# Patient Record
Sex: Male | Born: 1935 | Race: Black or African American | Hispanic: No | Marital: Married | Smoking: Former smoker
Health system: Southern US, Community
[De-identification: ages and names within clinical notes are randomized; demographics above are authoritative.]

## PROBLEM LIST (undated history)

## (undated) DIAGNOSIS — Z9989 Dependence on other enabling machines and devices: Secondary | ICD-10-CM

## (undated) DIAGNOSIS — G4730 Sleep apnea, unspecified: Secondary | ICD-10-CM

## (undated) DIAGNOSIS — R351 Nocturia: Secondary | ICD-10-CM

## (undated) DIAGNOSIS — E7800 Pure hypercholesterolemia, unspecified: Secondary | ICD-10-CM

## (undated) DIAGNOSIS — I517 Cardiomegaly: Secondary | ICD-10-CM

## (undated) DIAGNOSIS — C679 Malignant neoplasm of bladder, unspecified: Secondary | ICD-10-CM

## (undated) DIAGNOSIS — Z789 Other specified health status: Secondary | ICD-10-CM

## (undated) DIAGNOSIS — I10 Essential (primary) hypertension: Secondary | ICD-10-CM

## (undated) DIAGNOSIS — K219 Gastro-esophageal reflux disease without esophagitis: Secondary | ICD-10-CM

## (undated) DIAGNOSIS — H9190 Unspecified hearing loss, unspecified ear: Secondary | ICD-10-CM

## (undated) DIAGNOSIS — K759 Inflammatory liver disease, unspecified: Secondary | ICD-10-CM

## (undated) DIAGNOSIS — D126 Benign neoplasm of colon, unspecified: Secondary | ICD-10-CM

## (undated) DIAGNOSIS — K8020 Calculus of gallbladder without cholecystitis without obstruction: Secondary | ICD-10-CM

## (undated) DIAGNOSIS — Z87442 Personal history of urinary calculi: Secondary | ICD-10-CM

## (undated) DIAGNOSIS — G4733 Obstructive sleep apnea (adult) (pediatric): Secondary | ICD-10-CM

## (undated) DIAGNOSIS — N210 Calculus in bladder: Secondary | ICD-10-CM

## (undated) DIAGNOSIS — L309 Dermatitis, unspecified: Secondary | ICD-10-CM

## (undated) DIAGNOSIS — N320 Bladder-neck obstruction: Secondary | ICD-10-CM

## (undated) DIAGNOSIS — C61 Malignant neoplasm of prostate: Secondary | ICD-10-CM

## (undated) HISTORY — DX: Essential (primary) hypertension: I10

## (undated) HISTORY — PX: KNEE ARTHROSCOPY: SUR90

## (undated) HISTORY — DX: Dermatitis, unspecified: L30.9

## (undated) HISTORY — DX: Unspecified hearing loss, unspecified ear: H91.90

## (undated) HISTORY — DX: Malignant neoplasm of prostate: C61

## (undated) HISTORY — PX: CARDIOVASCULAR STRESS TEST: SHX262

## (undated) HISTORY — DX: Benign neoplasm of colon, unspecified: D12.6

## (undated) HISTORY — DX: Calculus of gallbladder without cholecystitis without obstruction: K80.20

---

## 2002-06-22 ENCOUNTER — Encounter: Admission: RE | Admit: 2002-06-22 | Discharge: 2002-06-22 | Payer: Self-pay | Admitting: Internal Medicine

## 2002-07-28 ENCOUNTER — Encounter: Admission: RE | Admit: 2002-07-28 | Discharge: 2002-07-28 | Payer: Self-pay | Admitting: Internal Medicine

## 2002-11-22 ENCOUNTER — Encounter: Admission: RE | Admit: 2002-11-22 | Discharge: 2002-11-22 | Payer: Self-pay | Admitting: Infectious Diseases

## 2003-01-22 ENCOUNTER — Encounter: Admission: RE | Admit: 2003-01-22 | Discharge: 2003-01-22 | Payer: Self-pay | Admitting: Internal Medicine

## 2003-01-24 ENCOUNTER — Encounter: Admission: RE | Admit: 2003-01-24 | Discharge: 2003-01-24 | Payer: Self-pay | Admitting: Internal Medicine

## 2003-08-23 ENCOUNTER — Encounter: Admission: RE | Admit: 2003-08-23 | Discharge: 2003-08-23 | Payer: Self-pay | Admitting: Internal Medicine

## 2003-09-03 ENCOUNTER — Encounter: Admission: RE | Admit: 2003-09-03 | Discharge: 2003-09-03 | Payer: Self-pay | Admitting: Internal Medicine

## 2003-09-10 ENCOUNTER — Encounter: Admission: RE | Admit: 2003-09-10 | Discharge: 2003-09-10 | Payer: Self-pay | Admitting: Internal Medicine

## 2003-10-26 ENCOUNTER — Emergency Department (HOSPITAL_COMMUNITY): Admission: EM | Admit: 2003-10-26 | Discharge: 2003-10-26 | Payer: Self-pay | Admitting: Emergency Medicine

## 2003-11-29 ENCOUNTER — Encounter: Admission: RE | Admit: 2003-11-29 | Discharge: 2003-11-29 | Payer: Self-pay | Admitting: Internal Medicine

## 2004-01-17 ENCOUNTER — Encounter: Admission: RE | Admit: 2004-01-17 | Discharge: 2004-01-17 | Payer: Self-pay | Admitting: Internal Medicine

## 2004-05-22 ENCOUNTER — Ambulatory Visit: Payer: Self-pay | Admitting: Internal Medicine

## 2004-06-26 ENCOUNTER — Ambulatory Visit: Payer: Self-pay | Admitting: Internal Medicine

## 2005-12-14 ENCOUNTER — Encounter: Payer: Self-pay | Admitting: Internal Medicine

## 2005-12-14 DIAGNOSIS — D126 Benign neoplasm of colon, unspecified: Secondary | ICD-10-CM

## 2005-12-14 HISTORY — DX: Benign neoplasm of colon, unspecified: D12.6

## 2005-12-14 LAB — HM COLONOSCOPY

## 2006-06-03 ENCOUNTER — Ambulatory Visit: Payer: Self-pay | Admitting: Internal Medicine

## 2006-06-03 LAB — CONVERTED CEMR LAB
AST: 23 units/L (ref 0–37)
Albumin: 4.4 g/dL (ref 3.5–5.2)
Alkaline Phosphatase: 43 units/L (ref 39–117)
Basophils percent auto: 0 % (ref 0–1)
Eosinophils Absolute: 0.2 cells/mcL (ref 0.0–0.7)
Eosinophils Relative: 2 % (ref 0–5)
HCT: 46.1 % (ref 39.0–52.0)
Leukocyte count, blood: 7.3 10*9/L (ref 4.0–10.5)
MCHC: 34.1 g/dL (ref 30.0–36.0)
MCV: 90 fL (ref 78.0–100.0)
Neutrophils Relative %: 36 % — ABNORMAL LOW (ref 43–77)
Platelets: 195 10*3/uL (ref 150–400)
Potassium: 3.9 meq/L (ref 3.5–5.3)
RDW: 13.3 % (ref 11.5–14.0)
Sodium: 138 meq/L (ref 135–145)
Total Bilirubin: 0.5 mg/dL (ref 0.3–1.2)
Total Protein: 7.4 g/dL (ref 6.0–8.3)

## 2006-07-09 DIAGNOSIS — G4700 Insomnia, unspecified: Secondary | ICD-10-CM | POA: Insufficient documentation

## 2006-07-09 DIAGNOSIS — B1710 Acute hepatitis C without hepatic coma: Secondary | ICD-10-CM | POA: Insufficient documentation

## 2006-07-09 DIAGNOSIS — F528 Other sexual dysfunction not due to a substance or known physiological condition: Secondary | ICD-10-CM

## 2006-07-09 DIAGNOSIS — M1712 Unilateral primary osteoarthritis, left knee: Secondary | ICD-10-CM | POA: Insufficient documentation

## 2006-07-09 DIAGNOSIS — Z87898 Personal history of other specified conditions: Secondary | ICD-10-CM

## 2006-07-09 DIAGNOSIS — Z8619 Personal history of other infectious and parasitic diseases: Secondary | ICD-10-CM

## 2006-07-09 DIAGNOSIS — J309 Allergic rhinitis, unspecified: Secondary | ICD-10-CM | POA: Insufficient documentation

## 2006-07-09 DIAGNOSIS — I10 Essential (primary) hypertension: Secondary | ICD-10-CM | POA: Insufficient documentation

## 2007-07-06 ENCOUNTER — Ambulatory Visit (HOSPITAL_COMMUNITY): Admission: RE | Admit: 2007-07-06 | Discharge: 2007-07-06 | Payer: Self-pay | Admitting: Internal Medicine

## 2007-07-06 ENCOUNTER — Ambulatory Visit: Payer: Self-pay | Admitting: Internal Medicine

## 2007-07-06 DIAGNOSIS — G4733 Obstructive sleep apnea (adult) (pediatric): Secondary | ICD-10-CM

## 2007-08-08 ENCOUNTER — Ambulatory Visit: Payer: Self-pay | Admitting: Internal Medicine

## 2007-08-09 LAB — CONVERTED CEMR LAB: OCCULT 1: NEGATIVE

## 2007-08-10 LAB — CONVERTED CEMR LAB: OCCULT 3: NEGATIVE

## 2007-08-11 DIAGNOSIS — C61 Malignant neoplasm of prostate: Secondary | ICD-10-CM

## 2007-08-11 HISTORY — DX: Malignant neoplasm of prostate: C61

## 2007-08-11 LAB — CONVERTED CEMR LAB: OCCULT 2: NEGATIVE

## 2007-08-19 ENCOUNTER — Ambulatory Visit: Payer: Self-pay | Admitting: Internal Medicine

## 2007-11-16 ENCOUNTER — Encounter: Payer: Self-pay | Admitting: Internal Medicine

## 2007-11-17 ENCOUNTER — Ambulatory Visit (HOSPITAL_COMMUNITY): Admission: RE | Admit: 2007-11-17 | Discharge: 2007-11-17 | Payer: Self-pay | Admitting: Urology

## 2007-11-22 ENCOUNTER — Ambulatory Visit: Admission: RE | Admit: 2007-11-22 | Discharge: 2008-02-20 | Payer: Self-pay | Admitting: Radiation Oncology

## 2008-01-31 ENCOUNTER — Ambulatory Visit: Payer: Self-pay | Admitting: Internal Medicine

## 2008-02-21 ENCOUNTER — Ambulatory Visit: Admission: RE | Admit: 2008-02-21 | Discharge: 2008-04-01 | Payer: Self-pay | Admitting: Radiation Oncology

## 2008-03-28 ENCOUNTER — Encounter: Payer: Self-pay | Admitting: Internal Medicine

## 2008-04-03 ENCOUNTER — Encounter: Admission: RE | Admit: 2008-04-03 | Discharge: 2008-04-03 | Payer: Self-pay

## 2008-04-11 ENCOUNTER — Ambulatory Visit (HOSPITAL_BASED_OUTPATIENT_CLINIC_OR_DEPARTMENT_OTHER): Admission: RE | Admit: 2008-04-11 | Discharge: 2008-04-11 | Payer: Self-pay | Admitting: Urology

## 2008-04-11 ENCOUNTER — Encounter: Payer: Self-pay | Admitting: Internal Medicine

## 2008-04-11 HISTORY — PX: OTHER SURGICAL HISTORY: SHX169

## 2008-05-01 ENCOUNTER — Ambulatory Visit: Admission: RE | Admit: 2008-05-01 | Discharge: 2008-06-23 | Payer: Self-pay | Admitting: Radiation Oncology

## 2008-05-02 ENCOUNTER — Encounter: Payer: Self-pay | Admitting: Internal Medicine

## 2008-05-07 ENCOUNTER — Ambulatory Visit: Payer: Self-pay | Admitting: Internal Medicine

## 2008-05-09 ENCOUNTER — Telehealth: Payer: Self-pay | Admitting: Internal Medicine

## 2008-06-07 ENCOUNTER — Encounter: Payer: Self-pay | Admitting: Internal Medicine

## 2008-08-10 DIAGNOSIS — C679 Malignant neoplasm of bladder, unspecified: Secondary | ICD-10-CM

## 2008-08-10 HISTORY — DX: Malignant neoplasm of bladder, unspecified: C67.9

## 2008-12-19 ENCOUNTER — Ambulatory Visit (HOSPITAL_COMMUNITY): Admission: RE | Admit: 2008-12-19 | Discharge: 2008-12-20 | Payer: Self-pay | Admitting: Urology

## 2008-12-19 ENCOUNTER — Encounter (INDEPENDENT_AMBULATORY_CARE_PROVIDER_SITE_OTHER): Payer: Self-pay | Admitting: Urology

## 2008-12-19 DIAGNOSIS — D303 Benign neoplasm of bladder: Secondary | ICD-10-CM

## 2008-12-19 HISTORY — PX: TRANSURETHRAL RESECTION OF BLADDER NECK: SHX6196

## 2008-12-24 ENCOUNTER — Emergency Department (HOSPITAL_COMMUNITY): Admission: EM | Admit: 2008-12-24 | Discharge: 2008-12-24 | Payer: Self-pay | Admitting: Emergency Medicine

## 2009-02-19 ENCOUNTER — Encounter: Payer: Self-pay | Admitting: Internal Medicine

## 2009-02-19 ENCOUNTER — Encounter (INDEPENDENT_AMBULATORY_CARE_PROVIDER_SITE_OTHER): Payer: Self-pay | Admitting: Urology

## 2009-02-19 ENCOUNTER — Ambulatory Visit (HOSPITAL_BASED_OUTPATIENT_CLINIC_OR_DEPARTMENT_OTHER): Admission: RE | Admit: 2009-02-19 | Discharge: 2009-02-19 | Payer: Self-pay | Admitting: Urology

## 2009-02-19 HISTORY — PX: TRANSURETHRAL RESECTION OF BLADDER TUMOR: SHX2575

## 2009-04-03 ENCOUNTER — Ambulatory Visit: Payer: Self-pay | Admitting: Internal Medicine

## 2009-04-03 DIAGNOSIS — C7951 Secondary malignant neoplasm of bone: Secondary | ICD-10-CM

## 2009-04-03 DIAGNOSIS — R300 Dysuria: Secondary | ICD-10-CM | POA: Insufficient documentation

## 2009-04-03 DIAGNOSIS — N3941 Urge incontinence: Secondary | ICD-10-CM

## 2009-04-03 DIAGNOSIS — C61 Malignant neoplasm of prostate: Secondary | ICD-10-CM | POA: Insufficient documentation

## 2009-04-03 LAB — CONVERTED CEMR LAB
ALT: 38 units/L (ref 0–53)
Albumin: 4.3 g/dL (ref 3.5–5.2)
Bacteria, UA: NONE SEEN
Basophils Relative: 0 % (ref 0–1)
Bilirubin Urine: NEGATIVE
CO2: 22 meq/L (ref 19–32)
Chloride: 107 meq/L (ref 96–112)
Cholesterol: 152 mg/dL (ref 0–200)
Glucose, Bld: 100 mg/dL — ABNORMAL HIGH (ref 70–99)
Hemoglobin: 14.4 g/dL (ref 13.0–17.0)
LDL Cholesterol: 86 mg/dL (ref 0–99)
Lymphocytes Relative: 47 % — ABNORMAL HIGH (ref 12–46)
Lymphs Abs: 2.8 10*3/uL (ref 0.7–4.0)
Monocytes Absolute: 0.7 10*3/uL (ref 0.1–1.0)
Monocytes Relative: 12 % (ref 3–12)
Neutro Abs: 2.3 10*3/uL (ref 1.7–7.7)
Neutrophils Relative %: 39 % — ABNORMAL LOW (ref 43–77)
Nitrite: NEGATIVE
Potassium: 4.4 meq/L (ref 3.5–5.3)
RBC: 4.76 M/uL (ref 4.22–5.81)
Sodium: 141 meq/L (ref 135–145)
Specific Gravity, Urine: 1.026 (ref 1.005–1.0)
Total Bilirubin: 0.3 mg/dL (ref 0.3–1.2)
Total Protein: 7.8 g/dL (ref 6.0–8.3)
Urobilinogen, UA: 0.2 (ref 0.0–1.0)
VLDL: 37 mg/dL (ref 0–40)
WBC: 5.9 10*3/uL (ref 4.0–10.5)
pH: 5.5 (ref 5.0–8.0)

## 2009-04-09 ENCOUNTER — Encounter: Payer: Self-pay | Admitting: Internal Medicine

## 2009-04-10 ENCOUNTER — Ambulatory Visit: Payer: Self-pay | Admitting: Internal Medicine

## 2009-04-10 DIAGNOSIS — R195 Other fecal abnormalities: Secondary | ICD-10-CM

## 2009-04-24 ENCOUNTER — Encounter: Payer: Self-pay | Admitting: Internal Medicine

## 2009-05-24 ENCOUNTER — Encounter: Payer: Self-pay | Admitting: Internal Medicine

## 2009-06-05 ENCOUNTER — Ambulatory Visit: Payer: Self-pay | Admitting: Internal Medicine

## 2009-06-05 DIAGNOSIS — L309 Dermatitis, unspecified: Secondary | ICD-10-CM | POA: Insufficient documentation

## 2009-06-05 LAB — CONVERTED CEMR LAB
Bacteria, UA: NONE SEEN
Bilirubin Urine: NEGATIVE
Specific Gravity, Urine: 1.029 (ref 1.005–1.0)
pH: 5.5 (ref 5.0–8.0)

## 2010-07-16 ENCOUNTER — Telehealth: Payer: Self-pay | Admitting: *Deleted

## 2010-08-20 ENCOUNTER — Ambulatory Visit: Admit: 2010-08-20 | Payer: Self-pay

## 2010-09-01 ENCOUNTER — Telehealth: Payer: Self-pay | Admitting: Internal Medicine

## 2010-09-07 LAB — CONVERTED CEMR LAB
ALT: 39 units/L (ref 0–53)
AST: 23 units/L (ref 0–37)
Alkaline Phosphatase: 45 units/L (ref 39–117)
BUN: 13 mg/dL (ref 6–23)
Basophils Absolute: 0 10*3/uL (ref 0.0–0.1)
Basophils Relative: 0 % (ref 0–1)
Chloride: 109 meq/L (ref 96–112)
Creatinine, Ser: 1.23 mg/dL (ref 0.40–1.50)
Eosinophils Absolute: 0.3 10*3/uL (ref 0.2–0.7)
Hemoglobin: 15.8 g/dL (ref 13.0–17.0)
MCHC: 34.2 g/dL (ref 30.0–36.0)
MCV: 90.9 fL (ref 78.0–100.0)
Monocytes Absolute: 0.8 10*3/uL (ref 0.1–1.0)
Monocytes Relative: 11 % (ref 3–12)
Neutro Abs: 2.4 10*3/uL (ref 1.7–7.7)
Neutrophils Relative %: 34 % — ABNORMAL LOW (ref 43–77)
RBC: 5.08 M/uL (ref 4.22–5.81)
RDW: 13.4 % (ref 11.5–15.5)

## 2010-09-11 NOTE — Progress Notes (Signed)
Summary: Refill/gh  Phone Note Refill Request Message from:  Fax from Pharmacy on September 01, 2010 1:53 PM  Refills Requested: Medication #1:  TRIAMCINOLONE ACETONIDE 0.1 % CREA Apply a thin layer to affected area on hands two times a day for 10 days..   Last Refilled: 06/05/2009  Method Requested: Electronic Initial call taken by: Angelina Ok RN,  September 01, 2010 1:53 PM  Follow-up for Phone Call        Refilled electronically.   Follow-up by: Margarito Liner MD,  September 02, 2010 4:33 PM    Prescriptions: TRIAMCINOLONE ACETONIDE 0.1 % CREA (TRIAMCINOLONE ACETONIDE) Apply a thin layer to affected area on hands two times a day for 10 days.  #80 grams x 0   Entered and Authorized by:   Margarito Liner MD   Signed by:   Margarito Liner MD on 09/02/2010   Method used:   Electronically to        [facility] [address] [phone]* (retail)       [address]       [place], [place]  [id number]       Ph: [phone] or [phone]       Fax: [phone]   RxID:   [id number]

## 2010-09-11 NOTE — Progress Notes (Signed)
Summary: refill/ hla  Phone Note Refill Request Message from:  Fax from Pharmacy on July 16, 2010 2:09 PM  Refills Requested: Medication #1:  NIFEDIPINE CR OSMOTIC 60 MG XR24H-TAB Take 2 tablets by mouth once a day   Dosage confirmed as above?Dosage Confirmed Initial call taken by: Marin Roberts RN,  July 16, 2010 2:09 PM  Follow-up for Phone Call        Refilled electronically.  Please schedule a follow-up  appointment and notify patient; he has not been seen in over 1 year. Follow-up by: Margarito Liner MD,  July 16, 2010 6:22 PM  Additional Follow-up for Phone Call Additional follow up Details #1::        appt scheduled in jan Additional Follow-up by: Marin Roberts RN,  July 23, 2010 3:49 PM    Prescriptions: NIFEDIPINE CR OSMOTIC 60 MG XR24H-TAB (NIFEDIPINE) Take 2 tablets by mouth once a day  #60 Tablet x 1   Entered and Authorized by:   Margarito Liner MD   Signed by:   Margarito Liner MD on 07/16/2010   Method used:   [facility]        [facility] [facility] [address] [phone]* (retail)       [address]       [place], [place]  [id number]       Ph: [phone] or [phone]       Fax: [phone]   RxID:   [id number]

## 2010-09-19 ENCOUNTER — Other Ambulatory Visit: Payer: Self-pay | Admitting: Internal Medicine

## 2010-09-19 NOTE — Telephone Encounter (Signed)
Patient has not been seen here since October of 2010 and missed his last 2 appointments.  I will refill the med, but he will need to be seen before further refills are approved.  Please notify him and make an appointment for him.

## 2010-09-22 NOTE — Telephone Encounter (Signed)
Called and talked to pt's wife.  She has tried to make appointments but unable to reach anyone. I will have scheduling call tomorrow and make appointment.

## 2010-10-22 ENCOUNTER — Other Ambulatory Visit: Payer: Self-pay | Admitting: Internal Medicine

## 2010-11-16 LAB — CBC
HCT: 34 % — ABNORMAL LOW (ref 39.0–52.0)
Hemoglobin: 11.4 g/dL — ABNORMAL LOW (ref 13.0–17.0)
Platelets: 199 10*3/uL (ref 150–400)
RBC: 3.65 MIL/uL — ABNORMAL LOW (ref 4.22–5.81)
WBC: 5.7 10*3/uL (ref 4.0–10.5)

## 2010-11-16 LAB — BASIC METABOLIC PANEL
GFR calc Af Amer: >60 mL/min (ref >60)
GFR calc non Af Amer: >60 mL/min (ref >60)
Potassium: 4.3 mEq/L (ref 3.5–5.1)
Sodium: 141 mEq/L (ref 135–145)

## 2010-11-18 LAB — URINE CULTURE: Culture: NO GROWTH

## 2010-11-18 LAB — CBC
HCT: 44 % (ref 39.0–52.0)
MCHC: 34.8 g/dL (ref 30.0–36.0)
MCV: 90.1 fL (ref 78.0–100.0)
Platelets: 179 10*3/uL (ref 150–400)
RDW: 13.5 % (ref 11.5–15.5)

## 2010-11-18 LAB — URINALYSIS, ROUTINE W REFLEX MICROSCOPIC
Bilirubin Urine: NEGATIVE
Ketones, ur: NEGATIVE mg/dL
Nitrite: POSITIVE — AB
Nitrite: NEGATIVE
Protein, ur: 30 mg/dL — AB
Specific Gravity, Urine: 1.023 (ref 1.005–1.030)
Urobilinogen, UA: 0.2 mg/dL (ref 0.0–1.0)
pH: 5.5 (ref 5.0–8.0)

## 2010-11-18 LAB — BASIC METABOLIC PANEL
BUN: 10 mg/dL (ref 6–23)
GFR calc non Af Amer: >60 mL/min (ref >60)
Glucose, Bld: 93 mg/dL (ref 70–99)
Potassium: 4.2 mEq/L (ref 3.5–5.1)

## 2010-11-18 LAB — URINE MICROSCOPIC-ADD ON

## 2010-11-26 ENCOUNTER — Ambulatory Visit (INDEPENDENT_AMBULATORY_CARE_PROVIDER_SITE_OTHER): Payer: MEDICARE | Admitting: Internal Medicine

## 2010-11-26 ENCOUNTER — Encounter: Payer: Self-pay | Admitting: Internal Medicine

## 2010-11-26 VITALS — BP 120/80 | HR 70 | Temp 97.3°F | Ht 70.5 in | Wt 227.7 lb

## 2010-11-26 DIAGNOSIS — I10 Essential (primary) hypertension: Secondary | ICD-10-CM

## 2010-11-26 DIAGNOSIS — C61 Malignant neoplasm of prostate: Secondary | ICD-10-CM

## 2010-11-26 DIAGNOSIS — D126 Benign neoplasm of colon, unspecified: Secondary | ICD-10-CM

## 2010-11-26 DIAGNOSIS — G4733 Obstructive sleep apnea (adult) (pediatric): Secondary | ICD-10-CM

## 2010-11-26 DIAGNOSIS — R42 Dizziness and giddiness: Secondary | ICD-10-CM

## 2010-11-26 DIAGNOSIS — L259 Unspecified contact dermatitis, unspecified cause: Secondary | ICD-10-CM

## 2010-11-26 LAB — CBC WITH DIFFERENTIAL/PLATELET
Basophils Absolute: 0 10*3/uL (ref 0.0–0.1)
Basophils Relative: 0 % (ref 0–1)
Eosinophils Relative: 2 % (ref 0–5)
Lymphocytes Relative: 52 % — ABNORMAL HIGH (ref 12–46)
MCHC: 34.6 g/dL (ref 30.0–36.0)
MCV: 89.9 fL (ref 78.0–100.0)
Neutro Abs: 2.1 10*3/uL (ref 1.7–7.7)
Platelets: 184 10*3/uL (ref 150–400)
RDW: 13.6 % (ref 11.5–15.5)
WBC: 5.5 10*3/uL (ref 4.0–10.5)

## 2010-11-26 NOTE — Assessment & Plan Note (Signed)
Patient reports occasional self-limited episodes of postural dizziness that are brief, occur whenever he stands up rapidly after having bent over, and resolve without problems.  He denies any associated symptoms including near-syncope, syncope, chest pain, palpitations, or other problems.  He is not orthostatic today.  The plan is to check basic labs including a CBC, TSH, and metabolic panel.  I advised him to stand up slowly and make sure that he is able to steady himself in case dizziness does occur, and to let me know if he has any worsening of his symptoms.  For now, if his labs are unremarkable, then I will not pursue further workup unless his symptoms worsen.

## 2010-11-26 NOTE — Assessment & Plan Note (Signed)
Patient's hand eczema resolved previously following treatment with topical steroids, and has now recurred.  The plan is to continue his current regimen of topical steroid ointment at night and cream in the mornings.  I advised him also to schedule a follow up appointment with his dermatologist.

## 2010-11-26 NOTE — Assessment & Plan Note (Signed)
Patient reports reasonable compliance with the CPAP; I advised him to use it as much as possible on a nightly basis whenever he is sleeping.

## 2010-11-26 NOTE — Progress Notes (Signed)
  Subjective:    Patient ID: [name], male    DOB: Feb 13, 1936, 75 y.o.   MRN: [id number]  HPI Patient returns for follow up of his hypertension, obstructive sleep apnea, hand eczema, and other chronic medical problems.  He also reports occasional episodes of dizziness that occur when he stands up after bending over; these are self-limited and last for a few seconds before resolving.  He has had no associated symptoms of syncope, near syncope, chest pain, or palpitations.  He reports occasional transient sharp "needle-like" pains around his abdomen; these last for a few seconds and go away; they are superficial in quality.  Otherwise he reports that he is doing well.  He did not follow up for a recommended colonoscopy after his last visit here in October of 2010.  He reports that he has been following up with his urologist for his prostate cancer.  He reports that he is compliant with his medications.  He also reports that after his last visit here, he saw a dermatologist for his hand eczema and was put on Temovate ointment at night and triamcinolone 0.1% cream in the mornings.  He reports that the hand eczema resolved on that regimen, and then has recurred over the past few months so he resumed the Temovate ointment and triamcinolone cream.  He has not schedule a follow up visit with his dermatologist, and could not remember the name of his dermatologist.  He reports that he uses his CPAP for at least 4 hours per night.   Review of Systems  Constitutional: Negative for fever, chills, diaphoresis, activity change and unexpected weight change.  Respiratory: Negative for shortness of breath and wheezing.   Cardiovascular: Positive for leg swelling (Mild ankle swelling bilaterally.). Negative for chest pain.  Gastrointestinal: Negative for nausea, vomiting, abdominal pain, blood in stool and anal bleeding.  Genitourinary: Positive for urgency (Occasional.). Negative for dysuria, frequency, hematuria  and difficulty urinating.  Musculoskeletal: Negative for myalgias and arthralgias.  Neurological: Positive for dizziness. Negative for syncope, weakness and numbness.       Objective:   Physical Exam  Constitutional: No distress.  Cardiovascular: Normal rate, regular rhythm and normal heart sounds.  Exam reveals no gallop and no friction rub.   No murmur heard. Pulmonary/Chest: Effort normal and breath sounds normal. No respiratory distress. He has no wheezes. He has no rales.  Abdominal: Soft. Bowel sounds are normal. He exhibits no distension. There is no tenderness. There is no rebound and no guarding.  Musculoskeletal: He exhibits no edema.  Skin: Rash (Areas of thickened skin on his palms bilaterally with scaling, consistent with eczema) noted.          Assessment & Plan:

## 2010-11-26 NOTE — Assessment & Plan Note (Signed)
Although I advised patient to follow up with his gastroenterologist for repeat colonoscopy at the time of his last visit in October of 2010, he did not follow up as recommended.  I again advised him today of the importance of seeing his gastroenterologist to have a follow up colonoscopy, given his history of an adenomatous colonic polyp.  The plan is to refer him back to his gastroenterologist for evaluation.

## 2010-11-26 NOTE — Assessment & Plan Note (Signed)
Lab Results  Component Value Date   NA 141 04/03/2009   K 4.4 04/03/2009   CL 107 04/03/2009   CO2 22 04/03/2009   BUN 12 04/03/2009   CREATININE 1.09 04/03/2009    BP Readings from Last 3 Encounters:  11/26/10 120/80  06/05/09 148/81  04/03/09 151/82    Assessment: Hypertension control:  controlled  Progress toward goals:  at goal Barriers to meeting goals:  no barriers identified  Plan: Hypertension treatment:  continue current medications

## 2010-11-26 NOTE — Assessment & Plan Note (Signed)
Patient has no symptoms of recurrence.  The plan is to check a PSA today, and I advised him to follow up regularly with his urologist.

## 2010-11-27 LAB — COMPLETE METABOLIC PANEL WITH GFR
Albumin: 4.4 g/dL (ref 3.5–5.2)
BUN: 15 mg/dL (ref 6–23)
Calcium: 9.5 mg/dL (ref 8.4–10.5)
Chloride: 107 mEq/L (ref 96–112)
GFR, Est Non African American: >60 mL/min (ref >60)
Glucose, Bld: 91 mg/dL (ref 70–99)
Potassium: 4.1 mEq/L (ref 3.5–5.3)
Total Protein: 7.4 g/dL (ref 6.0–8.3)

## 2010-12-01 ENCOUNTER — Other Ambulatory Visit: Payer: Self-pay | Admitting: Internal Medicine

## 2010-12-15 ENCOUNTER — Encounter: Payer: Self-pay | Admitting: Internal Medicine

## 2010-12-23 NOTE — Op Note (Signed)
[name], [name]              ACCOUNT NO.:  [id number]   MEDICAL RECORD NO.:  [id number]          PATIENT TYPE:  OIB   LOCATION:  1433                         FACILITY:  [facility]   PHYSICIAN:  Courtney Paris, M.D.DATE OF BIRTH:  [birth year]   DATE OF PROCEDURE:  12/19/2008  DATE OF DISCHARGE:                               OPERATIVE REPORT   PREOPERATIVE DIAGNOSES:  1. Inflammatory lesion, possibly bladder tumor, right bladder neck.  2. Carcinoma of the prostate.   POSTOPERATIVE DIAGNOSES:  1. Inflammatory lesion, possibly bladder tumor, right bladder neck.  2. Carcinoma of the prostate.   OPERATION:  Transurethral resection lesion, bladder neck (1.5 cm.)   ANESTHESIA:  General.   SURGEON:  Courtney Paris, M.D.   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  He got a Lupron shot April 2009 and then had external  radiation therapy between July and August 2009.  He then had prostate  brachytherapy with seeds September 2009.  His first PSA October 2009  after seeds was 0.5.  He has had worsening irritative symptoms, had some  blood in his urine and on cystoscopy in the office had a sessile bladder  neck lesion on the right side that looked like a possible bladder tumor.  He is admitted to have this resected at this time.   The patient was placed on the operating table in a dorsal lithotomy  position after satisfactory induction of general anesthesia.  Was  prepped and draped with Betadine in the usual sterile fashion.  Cipro  was running.  Time-out was then performed and the patient and the  procedure then reconfirmed.  The patient was first cystoscoped with a 21  panendoscope.  No anterior strictures seen.  He had a somewhat short,  fibrotic-appearing prostate and the bladder was entered.  The lesion on  the right bladder neck was noted and photographed.  It was  quite  hyperemic and erythematous and was about 1-1/2 cm in greatest diameter.  When this was done, the urethra was dilated with Sissy Hoff sounds to 30-  [place] and the 28 continuous-flow resectoscope was inserted.  With  continuous flow, the lesion at the bladder neck on the right side was  resected with good hemostasis.  The chips were sent for pathological  examination.  Pictures were made of the postop view.  No other lesions  were seen within the bladder.  The prostate  was quite fibrotic and fixed.  Scope was removed and a 22 three-way  Foley catheter was inserted and continuous bladder irrigation was  started with glycine, to be followed by saline.  The patient was then  taken to the recovery room where he will be admitted for postoperative  extended recovery.      Courtney Paris, M.D.  Electronically Signed     HMK/MEDQ  D:  12/19/2008  T:  12/19/2008  Job:  [id number]

## 2010-12-23 NOTE — Discharge Summary (Signed)
[name], [name]              ACCOUNT NO.:  [id number]   MEDICAL RECORD NO.:  [id number]          PATIENT TYPE:  OIB   LOCATION:  1433                         FACILITY:  [facility]   PHYSICIAN:  Courtney Paris, M.D.DATE OF BIRTH:  Nov 25, 1935   DATE OF ADMISSION:  12/19/2008  DATE OF DISCHARGE:  12/20/2008                               DISCHARGE SUMMARY   DISCHARGE DIAGNOSES:  1. Tumor at the right anterior bladder neck, pending path report.  2. Previous carcinoma of the prostate, post radiation.  3. Microscopic hematuria.   OPERATIONS AND PROCEDURES:  TUR lesion right anterior bladder neck on  Dec 19, 2008.   BRIEF HISTORY:  This 75 year old black male has high risk bladder cancer  T1c with Gleason 4+3, diagnosed March of 2009.  He had Lupron shot in  April 2009 then had radiation externally between July and August of 2009  and then prostate brachytherapy September 2009.  His first PSA after  seeds in October was 0.5.  He had worsening irritative symptoms, cysto  in April of this year showed a sessile lesion at the right anterior  bladder neck and he enters to have this resected at this time.   After satisfactory preoperative evaluation, the patient was taken to the  operating room where the lesion at the right anterior bladder neck was  resected.  He was kept overnight for observation with some continuous  bladder irrigation which was clear.  When the irrigation stopped his  urine remained clear and he was discharged home.  He will continue his  Cipro and Vicodin as needed.  He also was on some Toviaz for some  irritative symptoms and will continue that as well.  He has an  appointment to see me next week in the office.  Sent home in improved  ambulatory condition on a regular diet.      Courtney Paris, M.D.  Electronically Signed     HMK/MEDQ  D:  12/20/2008  T:  12/20/2008  Job:  [id number]

## 2010-12-23 NOTE — Op Note (Signed)
[name], [name]              ACCOUNT NO.:  [id number]   MEDICAL RECORD NO.:  [id number]          PATIENT TYPE:  AMB   LOCATION:  [facility]                         FACILITY:  [facility]   PHYSICIAN:  Jamison Neighbor, M.D.  DATE OF BIRTH:  05-14-1936   DATE OF PROCEDURE:  04/11/2008  DATE OF DISCHARGE:                               OPERATIVE REPORT   SERVICE:  Urology.   PREOPERATIVE DIAGNOSIS:  Carcinoma of the prostate.   POSTOPERATIVE DIAGNOSIS:  Carcinoma of the prostate.   PROCEDURES:  Radiation seed implantation.  Secondary procedure  cystoscopy.   SURGEON:  Jamison Neighbor, M.D.  Radiation oncologist was Maryln Gottron, M.D.   ANESTHESIA:  General.   COMPLICATIONS:  None.   DRAINS:  A 16 French Foley catheter.   HISTORY:  This 75 year old male has known carcinoma of the prostate.  The patient has undergone radiation therapy and is now to complete his  procedure with seed implant.  His prostate is small in size, down to the  size of approximately 20 grams.  He has been fully prepared for the  procedure and will now undergo seed implantation.  The patient  understands the risks and benefits of the procedure and gave full  informed consent.   PROCEDURE IN DETAIL:  After successful induction of general anesthesia  the patient was placed in the dorsal lithotomy position and prepped with  Betadine and draped in the usual sterile fashion.  The patient had the  ultrasound probe inserted by Dr. Dayton Scrape and appropriate planning was  done.  The patient then underwent seed implantation with the Nucletron  system.  Total dose delivered was 110 centigray.  The patient had 16  needles, 60 seeds utilizing I-125.  The patient's fluoroscopic image  following the procedure showed what appeared to be excellent placement  of the seeds, one seed toward the apex may have entered a venous plexus  and appeared to be off to the side somewhat.  However, this was not an  area of great concern  and it was felt that will have appropriate  dosimetry.   Following the completion of the seed implant cystoscopic examination  showed a normal urethra.  There was no evidence of any seed material  anywhere within the prostate or the bladder.  The bladder itself was  inspected.  No tumors or stones could be seen.  Both ureteral orifices  were normal in configuration and location.   The Foley catheter was reinserted.  The patient will keep this  overnight.  He will be sent home with Septra DS and Lorcet and will  return to see [place] in followup in 2-3 weeks.      Jamison Neighbor, M.D.  Electronically Signed     RJE/MEDQ  D:  04/11/2008  T:  04/11/2008  Job:  [id number]

## 2010-12-23 NOTE — Op Note (Signed)
NAME:  [name], [name]              ACCOUNT NO.:  [id number]   MEDICAL RECORD NO.:  [id number]          PATIENT TYPE:  AMB   LOCATION:  [facility]                         [facility]ACILITY:  [facility]   PHYSICIAN:  Courtney Paris, M.D.DATE OF BIRTH:  [birth year]   DATE OF PROCEDURE:  02/19/2009  DATE OF DISCHARGE:                               OPERATIVE REPORT   PREOPERATIVE DIAGNOSES:  Hematuria, history of prostate cancer, status  post brachytherapy.   POSTOPERATIVE DIAGNOSES:  Hematuria, history of prostate cancer, status  post brachytherapy.   PROCEDURE PERFORMED:  1. Transurethral resection of bladder tumor (small).  2. Transurethral resection of bladder neck.   SURGEON:  Dr. Dennison Nancy. Kimbrough.   RESIDENT:  Martin Majestic. Richards.   ANESTHESIA:  General endotracheal anesthesia.   FINDINGS:  1. Small narrowing of the anterior urethra.  2. Erythematous friable bladder mucosa near the right bladder neck      region.  3. Erythematous friable prostatic tissue.   SPECIMENS:  Bladder and prostate chips to pathology.   DRAINS:  A 22-French Foley catheter to gravity.   INDICATIONS FOR PROCEDURE:  [name] is a 75 year old African American  male with a history of prostate cancer, status post brachytherapy.  He  has had recurrent bouts of gross hematuria.  Office cystoscopy revealed  friable mucosa at the bladder neck region.  He now presents for  resection and fulguration.   DESCRIPTION OF PROCEDURE IN DETAIL:  [name] was brought back to the  operating room and appropriately identified via his wristband.  A proper  timeout was called to confirm the correct patient, procedure and site.  After successful induction of general endotracheal anesthesia, he was  position in the dorsal lithotomy position and IV antibiotics were  administered.  His perineum was prepped and draped in the sterile  fashion.  The surgeon were sterilely gowned and gloves.   A 22-French cystoscopic sheath was used  to insert a 12 degrees lens  transurethrally into the bladder.  The anterior urethra revealed a mild  narrowing in the penile urethra which was traversed with ease.  Prostatic urethra showed evidence of friable prostatic fossa with some  mild oozing noted.  The bladder was entered and pancystoscopy was  performed.  The bladder was normal in shape and consistency.  There was  no evidence of any diverticula, stone or foreign bodies.  Bilateral  ureteral orifices noted in the usual anatomic position and seen to  effluxing clear yellow urine.  There was an area of hyperplastic,  hyperemic, erythematous mucosa approximately 2 x 3 cm located in the  right bladder just proximal to the right bladder neck.   The cystoscope was removed.  The urethra was sequentially dilated to 98-  [place] using male [employer] sounds.  A 26-French resectoscope sheath was  then used to insert the resectoscope transurethrally into the patient's  bladder.  It then systematically resected the erythematous bladder  lesion located at the right wall just proximal to the bladder neck  region.  The edges were fulgurated and excellent hemostasis was  achieved.  We then proceeded to  resect the bladder neck at the 5 and 7  o'clock positions down from the bladder down to the veru.  Excellent  hemostasis was once again achieved.  The prostatic and bladder chips  were then retrieved using a Toomey syringe.  Under low-flow, low  pressure conditions, the bladder and prostate were re-inspected and  there was no evidence of any bleeding identified.  Resectoscope was then  removed with a full bladder.  A 22-French Foley catheter was then  inserted transurethrally in the bladder and the balloon was inflated  with 10 mL of sterile water and connected to a drainage bag.  The urine  was clear at the end of the case.  The patient tolerated the procedure  well.  Please note, Dr. Aldean Ast was present, available and  participated in all  aspects of this patient's operation.   DISPOSITION:  The based tolerated the procedure well.  He was extubated  and transported to the PACU in stable condition.     ______________________________  [name], M.D.  Electronically Signed    KR/MEDQ  D:  02/19/2009  T:  02/19/2009  Job:  [id number]

## 2011-01-13 ENCOUNTER — Encounter: Payer: Self-pay | Admitting: Internal Medicine

## 2011-05-12 ENCOUNTER — Telehealth: Payer: Self-pay | Admitting: *Deleted

## 2011-05-12 NOTE — Telephone Encounter (Signed)
Pt's spouse calls and states pt for appr 2 weeks has c/o L shoulder pain, denies injuries, falls... "Just started hurting". appt is given for Friday at 1015 w/ dr Loistine Chance. Also is noted pt has not f/u w/ dr Meredith Pel as instructed and appt is made w/ dr Meredith Pel 11/14 at 1445. Spouse is instructed that pt may go to ED if pain becomes worse or to call triage back if needed.

## 2011-05-12 NOTE — Telephone Encounter (Signed)
Agree with plan 

## 2011-05-15 ENCOUNTER — Ambulatory Visit (HOSPITAL_COMMUNITY)
Admission: RE | Admit: 2011-05-15 | Discharge: 2011-05-15 | Disposition: A | Discharge status: Home / Self Care | Payer: Medicare Other | Source: Ambulatory Visit | Attending: Cardiovascular Disease | Admitting: Cardiovascular Disease

## 2011-05-15 ENCOUNTER — Encounter: Payer: Self-pay | Admitting: Internal Medicine

## 2011-05-15 ENCOUNTER — Ambulatory Visit (HOSPITAL_COMMUNITY)
Admission: RE | Admit: 2011-05-15 | Discharge: 2011-05-15 | Disposition: A | Discharge status: Home / Self Care | Payer: Medicare Other | Source: Ambulatory Visit | Attending: Internal Medicine | Admitting: Internal Medicine

## 2011-05-15 ENCOUNTER — Ambulatory Visit (INDEPENDENT_AMBULATORY_CARE_PROVIDER_SITE_OTHER): Payer: Medicare Other | Admitting: Internal Medicine

## 2011-05-15 VITALS — BP 131/79 | HR 57 | Temp 97.8°F | Ht 70.5 in | Wt 230.3 lb

## 2011-05-15 DIAGNOSIS — M549 Dorsalgia, unspecified: Secondary | ICD-10-CM | POA: Insufficient documentation

## 2011-05-15 DIAGNOSIS — Z23 Encounter for immunization: Secondary | ICD-10-CM

## 2011-05-15 DIAGNOSIS — N390 Urinary tract infection, site not specified: Secondary | ICD-10-CM

## 2011-05-15 DIAGNOSIS — Z8546 Personal history of malignant neoplasm of prostate: Secondary | ICD-10-CM | POA: Insufficient documentation

## 2011-05-15 DIAGNOSIS — C61 Malignant neoplasm of prostate: Secondary | ICD-10-CM

## 2011-05-15 DIAGNOSIS — Z79899 Other long term (current) drug therapy: Secondary | ICD-10-CM

## 2011-05-15 DIAGNOSIS — I498 Other specified cardiac arrhythmias: Secondary | ICD-10-CM | POA: Insufficient documentation

## 2011-05-15 DIAGNOSIS — I4460 Unspecified fascicular block: Secondary | ICD-10-CM | POA: Insufficient documentation

## 2011-05-15 DIAGNOSIS — R079 Chest pain, unspecified: Secondary | ICD-10-CM

## 2011-05-15 DIAGNOSIS — M5380 Other specified dorsopathies, site unspecified: Secondary | ICD-10-CM | POA: Insufficient documentation

## 2011-05-15 DIAGNOSIS — M4120 Other idiopathic scoliosis, site unspecified: Secondary | ICD-10-CM | POA: Insufficient documentation

## 2011-05-15 DIAGNOSIS — I10 Essential (primary) hypertension: Secondary | ICD-10-CM

## 2011-05-15 DIAGNOSIS — I517 Cardiomegaly: Secondary | ICD-10-CM | POA: Insufficient documentation

## 2011-05-15 LAB — COMPREHENSIVE METABOLIC PANEL
ALT: 43 U/L (ref 0–53)
AST: 30 U/L (ref 0–37)
Albumin: 4.2 g/dL (ref 3.5–5.2)
CO2: 22 mEq/L (ref 19–32)
Calcium: 9.1 mg/dL (ref 8.4–10.5)
Chloride: 106 mEq/L (ref 96–112)
Creat: 1.36 mg/dL — ABNORMAL HIGH (ref 0.50–1.35)
Potassium: 4.2 mEq/L (ref 3.5–5.3)
Total Protein: 7.5 g/dL (ref 6.0–8.3)

## 2011-05-15 LAB — POCT URINALYSIS DIPSTICK
Protein, UA: 30
Spec Grav, UA: >=1.03

## 2011-05-15 LAB — PSA: PSA: 3.53 ng/mL (ref <=4.00)

## 2011-05-15 LAB — CBC
HCT: 44.6 % (ref 39.0–52.0)
MCHC: 34.1 g/dL (ref 30.0–36.0)
Platelets: 209 10*3/uL (ref 150–400)
RDW: 13.6 % (ref 11.5–15.5)
WBC: 5.6 10*3/uL (ref 4.0–10.5)

## 2011-05-15 LAB — LIPID PANEL
HDL: 35 mg/dL — ABNORMAL LOW (ref >39)
Triglycerides: 100 mg/dL (ref <150)

## 2011-05-15 MED ORDER — ASPIRIN EC 325 MG PO TBEC: 325.0000 mg | DELAYED_RELEASE_TABLET | Freq: Every day | ORAL | Status: AC

## 2011-05-15 MED ORDER — CIPROFLOXACIN HCL 250 MG PO TABS: 250.0000 mg | ORAL_TABLET | Freq: Two times a day (BID) | ORAL | Status: AC

## 2011-05-15 MED ORDER — NITROGLYCERIN 0.4 MG SL SUBL: 0.4000 mg | SUBLINGUAL_TABLET | SUBLINGUAL | Status: DC | PRN

## 2011-05-15 NOTE — Progress Notes (Deleted)
  Subjective:    Patient ID: [name], male    DOB: Aug 28, 1935, 75 y.o.   MRN: [id number]  HPI    Review of Systems     Objective:   Physical Exam        Assessment & Plan:

## 2011-05-15 NOTE — Progress Notes (Signed)
Subjective:   Patient ID: EULON ALLNUTT male   DOB: Feb 03, 1936 75 y.o.   MRN: [id number]  HPI: Mr.Blas I Winton is a 75 y.o.  Male with PMH as outlined below who presented to the clinic for left shoulder and back pain. 1. Shoulder shoulder pain: started 4 days ago. He was lying down and experienced an aching pain in the chest area radiating to the left arm. Not associated with any other symptoms. Took an Aspirin which eased off the pain so he got get some sleep. The aching pain was presented on the next days . More while resting then with excertion. Patient denies any trauma or strain his shoulder.  2. back pain: sever pain  Over the weekend while lying. Took Asyl. The pain improved but there is still some soreness present. He noted  burning sensation and increased frequency with urination but this has been chronic due to prostate radiation  for prostate cancer 2009  3. Prostate Cancer : patient did follow up with Oncology 2 weeks ago.      Past Medical History  Diagnosis Date  . Hypertension   . Prostate cancer 2009    Stage T1c diagnosed by Dr. Marcelyn Bruins; treated with Lupron followed by external beam radiation and brachytherapy.  . OSA (obstructive sleep apnea)     Severe by diagnostic polysomnogram 06/22/2004.  Marland Kitchen Benign neoplasm of bladder     S/P transurethral resection of lesion of right anterior bladder neck on  12/19/2008; S/P transurethral resection of bladder tumor (small) and transurethral resection of bladder neck on 7/13 2010 by Dr. Vic Blackbird  . Dysuria   . Urge incontinence   . Adenomatous colon polyp 12/14/2005    5 mm polyp in descending colon, found on colonoscopy by Dr. Charlott Rakes  . Occult blood in stools   . Hand eczema   . BPH (benign prostatic hypertrophy)   . Insomnia   . Allergic rhinitis   . History of hepatitis B   . Hepatitis C     Hepatitis C RNA Quant on July 06, 2007 showed no detectable virus.  . ED (erectile dysfunction)   .  Primary osteoarthritis of both knees     S/P bilateral knee replacements.   Current Outpatient Prescriptions  Medication Sig Dispense Refill  . clobetasol (TEMOVATE) 0.05 % ointment Apply 1 application topically at bedtime.        Marland Kitchen NIFEdipine (PROCARDIA XL/ADALAT-CC) 60 MG 24 hr tablet Take 2 tablets (120 mg total) by mouth daily.  60 tablet  11  . triamcinolone (KENALOG) 0.1 % cream Apply 1 application topically every morning.         Review of Systems: Constitutional: Denies fever, chills, diaphoresis, appetite change and fatigue.  HEENT: Denies photophobia, eye pain, redness, hearing loss, ear pain, congestion, sore throat, rhinorrhea, sneezing, mouth sores, trouble swallowing, neck pain, neck stiffness and tinnitus.   Respiratory: Denies SOB, DOE, cough, chest tightness,  and wheezing.   Cardiovascular: Noted chest pain, palpitations and but no leg swelling.  Gastrointestinal: Denies nausea, vomiting, abdominal pain, diarrhea, constipation, blood in stool and abdominal distention.  Genitourinary: noted dysuria, urgency, frequency but denies hematuria  Musculoskeletal: Denies myalgias, back pain, joint swelling, arthralgias and gait problem.  Skin: Denies pallor, rash and wound.  Neurological: Denies dizziness, seizures, syncope, weakness, light-headedness, numbness and headaches.  Hematological: Denies adenopathy. Easy bruising, personal or family bleeding history    Objective:  Physical Exam: Filed Vitals:   05/15/11 1007  BP:  131/79  Pulse: 57  Temp: 97.8 F (36.6 C)  TempSrc: Oral  Height: 5' 10.5" (1.791 m)  Weight: 230 lb 4.8 oz (104.463 kg)  SpO2: 97%   Constitutional: Vital signs reviewed.  Patient is a well-developed and well-nourished  in no acute distress and cooperative with exam. Alert and oriented x3.  Eyes: PERRL, EOMI, conjunctivae normal, No scleral icterus.  Neck: Supple, Trachea midline normal ROM, No JVD, Cardiovascular: RRR, S1 normal, S2 normal, no MRG,  pulses symmetric and intact bilaterally Pulmonary/Chest: CTAB, no wheezes, rales, or rhonchi Abdominal: Soft. Non-tender, non-distended, bowel sounds are normal, no masses, organomegaly, or guarding present.  GU: no CVA tenderness Musculoskeletal: No joint deformities, erythema, or stiffness, ROM full in all fall extremities and no nontender Neurological: A&O x3, Strenght is normal and symmetric bilaterally, no focal motor deficit, sensory intact to light touch bilaterally.  Skin: Warm, dry and intact. No rash, cyanosis, or clubbing.

## 2011-05-15 NOTE — Assessment & Plan Note (Addendum)
Blood pressure well controlled on current regimen. BP Readings from Last 3 Encounters:  05/15/11 131/79  11/26/10 120/80  06/05/09 148/81   Update: Creatinine elevated. Need to recheck bmet at that time.

## 2011-05-15 NOTE — Assessment & Plan Note (Signed)
U/A was positive for Nitrates and Leucocytes with no Hematuria. Will start therapy with ciprofloxacin for 7 days. Will send for Urine culture and change management accordingly.

## 2011-05-15 NOTE — Assessment & Plan Note (Signed)
Patient was seen by Oncology 2 weeks ago.

## 2011-05-15 NOTE — Assessment & Plan Note (Addendum)
Worrisome for Angina. On day of evalution no pain was notable. An ECG was obtained which showed sinus bradycardia ,left anterior fascicular block with T wave abnormalities which did not change significantly from an ECG from 2010 ( from echart). Patient had an stress test years ago which was per patient report normal. Patient is only taking Nifedepine for blood pressure control. He does not take aspirin on a daily basis and does not take statin. I will refer the patient to Cardiology ( App. With Dr Waldron Labs On 05/21/11) for possible stress test. Furthermore I will obtain for risk stratification fasting lipid panel and Hgb A1c. I will start full dose aspirin which may help with possible muscle skeletal component of the pain and will provide nitroglycerin sl. I informed the patient that if the pain worsens significantly that he should call 911 and has to be evaluated as soon as possible.

## 2011-05-15 NOTE — Patient Instructions (Signed)
If you experience worsening chest pain call 911 . Take your medication as prescribed.

## 2011-05-15 NOTE — Assessment & Plan Note (Signed)
Unclear etiology but worissome for  pathological fracture with history of prostate cancer. Other DD include pyelonephritis since U/A was notable for pyuria although unlikely since patient was afebrile. Furthermore it could be just muscle skeletal. Will obtain L-Spine Xray for further evaluation. Await results for possible changes in management.  Will treat for UTI.

## 2011-05-16 LAB — URINALYSIS, ROUTINE W REFLEX MICROSCOPIC
Glucose, UA: NEGATIVE mg/dL
Hgb urine dipstick: NEGATIVE
pH: 6 (ref 5.0–8.0)

## 2011-05-16 LAB — URINALYSIS, MICROSCOPIC ONLY: Casts: NONE SEEN

## 2011-05-17 LAB — URINE CULTURE

## 2011-05-18 NOTE — Progress Notes (Signed)
Addended by: Almyra Deforest on: 05/18/2011 09:35 AM   Modules accepted: Orders

## 2011-05-21 ENCOUNTER — Ambulatory Visit (INDEPENDENT_AMBULATORY_CARE_PROVIDER_SITE_OTHER): Payer: Medicare Other | Admitting: Cardiovascular Disease

## 2011-05-21 ENCOUNTER — Encounter: Payer: Self-pay | Admitting: Cardiovascular Disease

## 2011-05-21 DIAGNOSIS — R9431 Abnormal electrocardiogram [ECG] [EKG]: Secondary | ICD-10-CM | POA: Insufficient documentation

## 2011-05-21 DIAGNOSIS — C61 Malignant neoplasm of prostate: Secondary | ICD-10-CM

## 2011-05-21 DIAGNOSIS — R079 Chest pain, unspecified: Secondary | ICD-10-CM

## 2011-05-21 DIAGNOSIS — I517 Cardiomegaly: Secondary | ICD-10-CM

## 2011-05-21 DIAGNOSIS — I10 Essential (primary) hypertension: Secondary | ICD-10-CM

## 2011-05-21 NOTE — Progress Notes (Signed)
Pleasant 74 yo referred by primary for chest pain, HTN, abnormal ECG and ? History of cardiomegaly.  Pain is very atypical with resting symptoms at night in recumbancy.  Pain ongoing last 2 months.  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  No recent trauma.  Pain centers around left sholder and radiates to arm worse when sleeping on that side.  Describes ? History of thick heart muscle.  No echo in echart.  Denies dyspnea PND, Orthopnea or signs of CHF.  No previous heart w/u.  Compliant with meds.  PSA up a bit S/P seed implants for prostate CA but otherwise health has been ok  ROS: Denies fever, malais, weight loss, blurry vision, decreased visual acuity, cough, sputum, SOB, hemoptysis, pleuritic pain, palpitaitons, heartburn, abdominal pain, melena, lower extremity edema, claudication, or rash.  All other systems reviewed and negative   General: Affect appropriate Healthy:  appears stated age HEENT: normal Neck supple with no adenopathy JVP normal no bruits no thyromegaly Lungs clear with no wheezing and good diaphragmatic motion Heart:  S1/S2 no murmur,rub, gallop or click PMI normal Abdomen: benighn, BS positve, no tenderness, no AAA no bruit.  No HSM or HJR Distal pulses intact with no bruits No edema Neuro non-focal Skin warm and dry No muscular weakness  Medications Current Outpatient Prescriptions  Medication Sig Dispense Refill  . aspirin EC 325 MG tablet Take 1 tablet (325 mg total) by mouth daily.  30 tablet  0  . ciprofloxacin (CIPRO) 250 MG tablet Take 1 tablet (250 mg total) by mouth 2 (two) times daily.  14 tablet  0  . clobetasol (TEMOVATE) 0.05 % ointment Apply 1 application topically at bedtime.        Marland Kitchen NIFEdipine (PROCARDIA XL/ADALAT-CC) 60 MG 24 hr tablet Take 2 tablets (120 mg total) by mouth daily.  60 tablet  11  . nitroGLYCERIN (NITROSTAT) 0.4 MG SL tablet Place 1 tablet (0.4 mg total) under the tongue every 5 (five) minutes as needed for chest pain.   30 tablet  2  . triamcinolone (KENALOG) 0.1 % cream Apply 1 application topically every morning.          Allergies Review of patient's allergies indicates no known allergies.  Family History: Family History  Problem Relation Age of Onset  . Cancer Neg Hx   . Diabetes Neg Hx   . Heart attack Mother 65    Social History: History   Social History  . Marital Status: Married    Spouse Name: N/A    Number of Children: N/A  . Years of Education: N/A   Occupational History  . Not on file.   Social History Main Topics  . Smoking status: Former Smoker -- 0.2 packs/day for 25 years    Quit date: 11/26/1999  . Smokeless tobacco: Never Used  . Alcohol Use: No  . Drug Use: No  . Sexually Active: Not on file   Other Topics Concern  . Not on file   Social History Narrative  . No narrative on file    Electrocardiogram:  NSR 47 LVH LAD lateral T wave changes.  106/12  Reviewed ECG from 12/19/2008 and no change  Assessment and Plan

## 2011-05-21 NOTE — Patient Instructions (Signed)
Your physician has requested that you have en exercise stress myoview. For further information please visit [email or web address]. Please follow instruction sheet, as given.   Your physician has requested that you have an echocardiogram. Echocardiography is a painless test that uses sound waves to create images of your heart. It provides your doctor with information about the size and shape of your heart and how well your heart's chambers and valves are working. This procedure takes approximately one hour. There are no restrictions for this procedure.     

## 2011-05-21 NOTE — Assessment & Plan Note (Signed)
Atypical with abnormal ECG  Stress myovue

## 2011-05-21 NOTE — Assessment & Plan Note (Signed)
Well controlled.  Continue current medications and low sodium Dash type diet.    

## 2011-05-21 NOTE — Assessment & Plan Note (Signed)
History of cardiomegaly with abnormal ECG and HTN  Echo to assess LVH and LV size and function.

## 2011-05-21 NOTE — Assessment & Plan Note (Signed)
No change from May 2010  Suspect it relfects LVH more than anything else.  Echo

## 2011-05-21 NOTE — Assessment & Plan Note (Signed)
Frequency unchanges.  Monitor PSA  F/U urology

## 2011-05-22 ENCOUNTER — Encounter: Payer: Medicare Other | Admitting: Internal Medicine

## 2011-05-28 ENCOUNTER — Encounter: Payer: Self-pay | Admitting: *Deleted

## 2011-06-02 ENCOUNTER — Encounter (HOSPITAL_COMMUNITY): Payer: Medicare Other | Admitting: Radiology

## 2011-06-02 ENCOUNTER — Other Ambulatory Visit (HOSPITAL_COMMUNITY): Payer: Medicare Other

## 2011-06-03 ENCOUNTER — Encounter: Payer: Medicare Other | Admitting: Internal Medicine

## 2011-06-24 ENCOUNTER — Encounter: Payer: Self-pay | Admitting: Internal Medicine

## 2011-06-24 ENCOUNTER — Ambulatory Visit (INDEPENDENT_AMBULATORY_CARE_PROVIDER_SITE_OTHER): Payer: Medicare Other | Admitting: Internal Medicine

## 2011-06-24 DIAGNOSIS — M129 Arthropathy, unspecified: Secondary | ICD-10-CM

## 2011-06-24 DIAGNOSIS — L259 Unspecified contact dermatitis, unspecified cause: Secondary | ICD-10-CM

## 2011-06-24 DIAGNOSIS — D126 Benign neoplasm of colon, unspecified: Secondary | ICD-10-CM

## 2011-06-24 DIAGNOSIS — R079 Chest pain, unspecified: Secondary | ICD-10-CM

## 2011-06-24 DIAGNOSIS — I10 Essential (primary) hypertension: Secondary | ICD-10-CM

## 2011-06-24 DIAGNOSIS — C61 Malignant neoplasm of prostate: Secondary | ICD-10-CM

## 2011-06-24 MED ORDER — CLOBETASOL PROPIONATE 0.05 % EX OINT: 1.0000 "application " | TOPICAL_OINTMENT | Freq: Every day | CUTANEOUS | Status: DC

## 2011-06-24 MED ORDER — TRIAMCINOLONE ACETONIDE 0.1 % EX CREA: 1.0000 "application " | TOPICAL_CREAM | CUTANEOUS | Status: DC

## 2011-06-24 NOTE — Assessment & Plan Note (Signed)
Patient has chronic stable knee pain, but he has not had symptoms suggesting inflammation, and specifically no knee swelling, warmth, or tenderness.  His exam today shows no swelling, no tenderness, no warmth, and no erythema; there is no evidence of knee effusion.  He reports a history of bilateral cartilage injuries for which he was treated by an orthopedist several years ago.  I offered to refer him back to orthopedics, but he does not feel that the pain is severe enough to warrant referral at this time.  I advised him to let me know if he changes his mind.

## 2011-06-24 NOTE — Patient Instructions (Signed)
Please schedule a followup appointment with your GI physician Dr. Bosie Clos.

## 2011-06-24 NOTE — Assessment & Plan Note (Signed)
Patient's blood pressure is near goal on current regimen; plan is to continue Procardia XL at current dose.

## 2011-06-24 NOTE — Progress Notes (Signed)
  Subjective:    Patient ID: [name], male    DOB: 1935-12-09, 75 y.o.   MRN: [id number]  HPI Patient returns for followup of chest pain, hypertension, hand eczema, and other chronic medical problems.  He was referred to Dr. Eden Emms for cardiology evaluation in October, and was scheduled for an outpatient nuclear medicine stress study; however, patient says he canceled the study because he was concerned that his chronic left knee pain would interfere with his ability to walk on a treadmill.  He reports that his chest pain has largely resolved.  He reports chronic knee pain, and he says he had a cartilage injury diagnosed years ago by an orthopedist; although his problem list includes a history of bilateral knee replacement, patient denies he has ever had knee surgery.  He has had worsening of his hand eczema after he ran out of his topical steroid ointment and cream; previously he had good response to those medications.  He has not scheduled followup with Dr. Bosie Clos for GI evaluation given his history of adenomatous colonic polyp, although I have advised this.  He reports that he does followup with his urologist for his prostate cancer.   Review of Systems  Constitutional: Negative for fever and chills.  Respiratory: Negative for chest tightness and shortness of breath.   Cardiovascular: Negative for chest pain and leg swelling.  Gastrointestinal: Negative for nausea, abdominal pain and rectal pain.  Genitourinary: Negative for dysuria and difficulty urinating.  Neurological: Negative for dizziness.       Objective:   Physical Exam  Constitutional: No distress.  Cardiovascular: Normal rate and regular rhythm.  Exam reveals no gallop and no friction rub.   No murmur heard. Pulmonary/Chest: Effort normal and breath sounds normal. He has no wheezes. He has no rales.  Abdominal: Soft. Bowel sounds are normal. He exhibits no distension. There is no tenderness. There is no guarding.    Musculoskeletal: He exhibits no edema.       Assessment & Plan:

## 2011-06-24 NOTE — Assessment & Plan Note (Signed)
Patient reports that his chest pain has largely resolved.  He canceled his scheduled nuclear medicine stress study in October; he says this was because he was concerned about being unable to walk on a treadmill due to his chronic knee pain.  Plan is to contact Dr. Eden Emms for advice on whether to have patient reschedule an adenosine stress study, or whether he should followup with Dr. Eden Emms before a decision is made to reschedule the study.  I advised patient to let us know immediately if his pain recurs.

## 2011-06-24 NOTE — Assessment & Plan Note (Signed)
Patient has recurrent hand eczema, and is currently out of his topical steroid.  The plan is to refill Temovate ointment for bedtime application and Kenalog cream for morning application.  I advised him to followup with his dermatologist if the eczema does not resolve with resumption of topical steroid treatment.

## 2011-06-24 NOTE — Assessment & Plan Note (Signed)
I advised patient to follow up with his urologist.

## 2011-06-24 NOTE — Assessment & Plan Note (Signed)
I again advised patient to follow up with his gastroenterologist Dr. Bosie Clos, and I advised him that he needs a follow-up colonoscopy given his previous finding of adenomatous colon polyp.

## 2011-07-07 ENCOUNTER — Encounter: Payer: Self-pay | Admitting: *Deleted

## 2011-07-07 NOTE — Progress Notes (Signed)
I called dr Fabio Bering office and scheduled pt for  [facility] per order Dr Meredith Pel. Pt informed test is 12/4 at 8:45 and should last 3 - 4 hours. He is to bring all his meds and fast - no food or drinking He will need to bring a book and comfortable shoes.

## 2011-07-14 ENCOUNTER — Ambulatory Visit (HOSPITAL_COMMUNITY): Payer: Medicare Other | Attending: Cardiology | Admitting: Radiology

## 2011-07-14 ENCOUNTER — Encounter (HOSPITAL_COMMUNITY): Payer: Medicare Other | Admitting: Radiology

## 2011-07-14 DIAGNOSIS — Z87891 Personal history of nicotine dependence: Secondary | ICD-10-CM | POA: Insufficient documentation

## 2011-07-14 DIAGNOSIS — R9431 Abnormal electrocardiogram [ECG] [EKG]: Secondary | ICD-10-CM

## 2011-07-14 DIAGNOSIS — I10 Essential (primary) hypertension: Secondary | ICD-10-CM

## 2011-07-14 DIAGNOSIS — I517 Cardiomegaly: Secondary | ICD-10-CM | POA: Insufficient documentation

## 2011-07-14 DIAGNOSIS — Z8249 Family history of ischemic heart disease and other diseases of the circulatory system: Secondary | ICD-10-CM | POA: Insufficient documentation

## 2011-07-14 DIAGNOSIS — R079 Chest pain, unspecified: Secondary | ICD-10-CM

## 2011-07-14 MED ORDER — TECHNETIUM TC 99M TETROFOSMIN IV KIT
33.0000 | PACK | Freq: Once | INTRAVENOUS | Status: AC | PRN
  Administered 2011-07-14: 33 via INTRAVENOUS

## 2011-07-14 MED ORDER — TECHNETIUM TC 99M TETROFOSMIN IV KIT
11.0000 | PACK | Freq: Once | INTRAVENOUS | Status: AC | PRN
  Administered 2011-07-14: 11 via INTRAVENOUS

## 2011-07-14 NOTE — Progress Notes (Signed)
[facility] SITE 3 NUCLEAR MED [address] [place] [address] [phone]  Cardiology Nuclear Med Study  SCHON ZEIDERS is a 75 y.o. male [id number] 1936-07-18   Nuclear Med Background Indication for Stress Test:  Evaluation for Ischemia History:  No previous documented CAD and Abnormal EKG Cardiac Risk Factors: Family History - CAD, History of Smoking and Hypertension  Symptoms:  Chest Pain (last date of chest discomfort 1 week ago)   Nuclear Pre-Procedure Caffeine/Decaff Intake:  None NPO After: 7:00pm   Lungs:  clear IV 0.9% NS with Angio Cath:  20g  IV Site: R Forearm  IV Started by:  Stanton Kidney, EMT-P  Chest Size (in):  46 Cup Size: n/a  Height: 5' 10.5" (1.791 m)  Weight:  230 lb (104.327 kg)  BMI:  Body mass index is 32.54 kg/(m^2). Tech Comments:  NA    Nuclear Med Study 1 or 2 day study: 1 day  Stress Test Type:  Stress  Reading MD: Marca Ancona, MD  Order Authorizing Provider:  P.Nishan  Resting Radionuclide: Technetium 89m Tetrofosmin  Resting Radionuclide Dose: 11.0 mCi   Stress Radionuclide:  Technetium 43m Tetrofosmin  Stress Radionuclide Dose:33.0 mCi           Stress Protocol Rest HR: 53 Stress HR: 144  Rest BP: 121/64 Stress BP: 231/102  Exercise Time (min): 4:00 METS: 5.8   Predicted Max HR: 147 bpm % Max HR: 97.96 bpm Rate Pressure Product: 21308   Dose of Adenosine (mg):  n/a Dose of Lexiscan: n/a mg  Dose of Atropine (mg): n/a Dose of Dobutamine: n/a mcg/kg/min (at max HR)  Stress Test Technologist: Milana Na, EMT-P  Nuclear Technologist:  Domenic Polite, CNMT     Rest Procedure:  Myocardial perfusion imaging was performed at rest 45 minutes following the intravenous administration of Technetium 29m Tetrofosmin. Rest ECG: NSR with non-specific ST-T wave changes  Stress Procedure:  The patient exercised for 4:00.  The patient stopped due to sob, fatigue, and denied any chest pain.  There were non specific  ST-T wave changes and rare pacs.  Technetium 37m Tetrofosmin was injected at peak exercise and myocardial perfusion imaging was performed after a brief delay. Stress ECG: No significant change from baseline ECG  QPS Raw Data Images:  Normal; no motion artifact; normal heart/lung ratio. Stress Images:  Normal homogeneous uptake in all areas of the myocardium. Rest Images:  Normal homogeneous uptake in all areas of the myocardium. Subtraction (SDS):  There is no evidence of scar or ischemia. Transient Ischemic Dilatation (Normal <1.22):  1.06 Lung/Heart Ratio (Normal <0.45):  0.27  Quantitative Gated Spect Images QGS EDV:  120 ml QGS ESV:  45 ml QGS cine images:  NL LV Function; NL Wall Motion or Normal Wall Motion QGS EF: 62%  Impression Exercise Capacity:  Poor exercise capacity. BP Response:  Hypertensive blood pressure response. Clinical Symptoms:  Short of breath, no chest pain.  ECG Impression:  NSR, IVCD.  No change with stress.  Comparison with Prior Nuclear Study: No images to compare  Overall Impression:  Normal stress nuclear study.  Kenly Henckel [employer]

## 2011-07-16 ENCOUNTER — Telehealth: Payer: Self-pay | Admitting: Cardiovascular Disease

## 2011-07-16 NOTE — Telephone Encounter (Signed)
Fu call °Pt returning your call  °

## 2011-07-16 NOTE — Telephone Encounter (Signed)
PT AWARE OF MYOVIEW RESULTS./CY 

## 2011-07-20 NOTE — Progress Notes (Signed)
Addended by: Bufford Spikes on: 07/20/2011 04:34 PM   Modules accepted: Orders

## 2011-08-07 ENCOUNTER — Other Ambulatory Visit: Payer: Medicare Other

## 2011-08-26 ENCOUNTER — Other Ambulatory Visit: Payer: Self-pay | Admitting: Internal Medicine

## 2011-08-26 MED ORDER — CLOBETASOL PROPIONATE 0.05 % EX OINT: 1.0000 "application " | TOPICAL_OINTMENT | Freq: Every day | CUTANEOUS | Status: DC

## 2011-08-26 MED ORDER — TRIAMCINOLONE ACETONIDE 0.1 % EX CREA: 1.0000 "application " | TOPICAL_CREAM | CUTANEOUS | Status: DC

## 2011-10-07 ENCOUNTER — Ambulatory Visit (INDEPENDENT_AMBULATORY_CARE_PROVIDER_SITE_OTHER): Payer: Medicare Other | Admitting: Internal Medicine

## 2011-10-07 ENCOUNTER — Encounter: Payer: Self-pay | Admitting: Internal Medicine

## 2011-10-07 DIAGNOSIS — M25511 Pain in right shoulder: Secondary | ICD-10-CM

## 2011-10-07 DIAGNOSIS — I10 Essential (primary) hypertension: Secondary | ICD-10-CM

## 2011-10-07 DIAGNOSIS — M25519 Pain in unspecified shoulder: Secondary | ICD-10-CM

## 2011-10-07 DIAGNOSIS — Z23 Encounter for immunization: Secondary | ICD-10-CM

## 2011-10-07 DIAGNOSIS — R39198 Other difficulties with micturition: Secondary | ICD-10-CM | POA: Insufficient documentation

## 2011-10-07 MED ORDER — NAPROXEN 250 MG PO TABS: ORAL_TABLET | ORAL | Status: DC

## 2011-10-07 MED ORDER — ESOMEPRAZOLE MAGNESIUM 40 MG PO CPDR: 40.0000 mg | DELAYED_RELEASE_CAPSULE | Freq: Every day | ORAL | Status: DC

## 2011-10-07 NOTE — Assessment & Plan Note (Addendum)
Lab Results  Component Value Date   NA 139 05/15/2011   K 4.2 05/15/2011   CL 106 05/15/2011   CO2 22 05/15/2011   BUN 15 05/15/2011   CREATININE 1.36* 05/15/2011    BP Readings from Last 3 Encounters:  10/07/11 132/82  06/24/11 145/87  05/21/11 127/77    Assessment: Hypertension control:  controlled  Progress toward goals:  at goal Barriers to meeting goals:  no barriers identified  Plan: Hypertension treatment:  continue nifedipine XL 120 mg daily; check metabolic panel today.

## 2011-10-07 NOTE — Assessment & Plan Note (Signed)
Assessment: Right shoulder pain, with symptoms suggestive of rotator cuff syndrome.  Plan: Naproxen as needed for shoulder pain; I cautioned patient about potential side effects and advised a limited course.  Will also add Nexium for GI protection.  Will refer to sports medicine for evaluation and treatment.

## 2011-10-07 NOTE — Patient Instructions (Signed)
Take naproxen 250 mg 1-2 tablets twice a day as needed for right shoulder pain; take with food; use sparingly for pain unrelieved by over-the-counter acetaminophen (Tylenol). Start esomeprazole (Nexium) 40 mg one tablet daily for stomach protection. An appointment has been made with the sports medicine clinic for treatment of right shoulder pain. Please see your urology doctor for followup of prostate cancer and for your symptom of decreased urinary stream. Please see your GI physician for repeat colonoscopy to followup the colon polyp found at the time of your last colonoscopy.

## 2011-10-07 NOTE — Assessment & Plan Note (Addendum)
Assessment: Patient has a history of prostate cancer and prior resection of a benign bladder tumor.  I emphasized to him the importance of followup with his urologist.  Plan: Will check urinalysis and PSA today; I advised him to follow up with his urologist in the near future.

## 2011-10-07 NOTE — Progress Notes (Signed)
  Subjective:    Patient ID: [name], male    DOB: 1935/09/28, 76 y.o.   MRN: [id number]  HPI Patient returns for followup of his hypertension and other chronic medical problems.  His main complaint today is right shoulder pain which started about 3 weeks ago and is aggravated by reaching up or elevating his shoulder, and also some pain at night after going to bed.  He has been taking Aleve 1 or 2 tablets daily as needed, with incomplete relief of the pain.  He denies any injury to his shoulder.  Patient did not bring his medications to clinic today, but confirmed his medications from the med list.  He reports some difficulty urinating with decrease in his stream over the past few months; he has not followed up recently with his urologist.  He also has not scheduled followup with his gastroenterologist for a repeat surveillance colonoscopy.   Review of Systems  Constitutional: Negative for fever, chills, diaphoresis and appetite change.  HENT: Positive for tinnitus (Chronic bilateral tinnitus.).   Eyes: Negative for visual disturbance.  Respiratory: Negative for shortness of breath.   Cardiovascular: Negative for chest pain.  Gastrointestinal: Positive for abdominal pain (Occasional "pin-like" sharp pains, last a few seconds.). Negative for nausea, vomiting and blood in stool.  Genitourinary: Positive for difficulty urinating (Some decrease in stream.).  Musculoskeletal: Positive for arthralgias (Right shoulder pain.).       Objective:   Physical Exam  Constitutional: No distress.  HENT:  Right Ear: Tympanic membrane normal.  Left Ear: Tympanic membrane normal.  Cardiovascular: Normal rate, regular rhythm and normal heart sounds.  Exam reveals no gallop and no friction rub.   No murmur heard. Pulmonary/Chest: Effort normal and breath sounds normal. He has no wheezes. He has no rales.  Abdominal: Soft. Bowel sounds are normal. There is no tenderness. There is no guarding.    Musculoskeletal: He exhibits no edema.       Right shoulder: He exhibits decreased range of motion (Pain with abduction beyond 100). He exhibits no tenderness, no crepitus, no deformity and normal strength.        Assessment & Plan:

## 2011-10-08 ENCOUNTER — Telehealth: Payer: Self-pay | Admitting: Internal Medicine

## 2011-10-08 DIAGNOSIS — R8281 Pyuria: Secondary | ICD-10-CM | POA: Insufficient documentation

## 2011-10-08 DIAGNOSIS — C61 Malignant neoplasm of prostate: Secondary | ICD-10-CM

## 2011-10-08 LAB — COMPLETE METABOLIC PANEL WITH GFR
Albumin: 4.7 g/dL (ref 3.5–5.2)
Alkaline Phosphatase: 49 U/L (ref 39–117)
BUN: 14 mg/dL (ref 6–23)
GFR, Est Non African American: 65 mL/min
Glucose, Bld: 94 mg/dL (ref 70–99)
Total Bilirubin: 0.4 mg/dL (ref 0.3–1.2)

## 2011-10-08 LAB — URINALYSIS, MICROSCOPIC ONLY
Bacteria, UA: NONE SEEN
Crystals: NONE SEEN

## 2011-10-08 LAB — PSA: PSA: 5.76 ng/mL — ABNORMAL HIGH (ref <=4.00)

## 2011-10-08 LAB — URINALYSIS, ROUTINE W REFLEX MICROSCOPIC
Nitrite: NEGATIVE
Protein, ur: 30 mg/dL — AB
Specific Gravity, Urine: 1.028 (ref 1.005–1.030)
Urobilinogen, UA: 0.2 mg/dL (ref 0.0–1.0)

## 2011-10-08 NOTE — Progress Notes (Signed)
Quick Note:  Urinalysis shows pyuria but no bacteria seen; PSA has increased from 3.53 on 05/15/11 to 5.76 yesterday. Plan is to have patient come in so we can collect a specimen for urine culture, and schedule a follow-up appointment with patient's urologist. ______

## 2011-10-08 NOTE — Assessment & Plan Note (Signed)
Lab results from 2/27 returned; urinalysis shows pyuria but no bacteria seen; PSA has increased from 3.53 on 05/15/11 to 5.76 yesterday.  Plan is to have patient come in so we can collect a specimen for urine culture, and schedule a follow-up appointment with patient's urologist.

## 2011-10-08 NOTE — Assessment & Plan Note (Signed)
Lab results from 2/27 returned; urinalysis shows pyuria but no bacteria seen; PSA has increased from 3.53 on 05/15/11 to 5.76 yesterday.  Plan is to have patient come in so we can collect a specimen for urine culture, and schedule a follow-up appointment with patient's urologist. 

## 2011-10-08 NOTE — Telephone Encounter (Signed)
I reached patient by phone and discussed his lab results with him, including his urinalysis which showed pyuria and his PSA which is now mildly elevated and has increased compared to his prior value.  He will come in tomorrow for a urine culture; he has been having some chronic mild dysuria, but no fever or other frank symptoms of urinary tract infection.  The plan will be to treat for infection if his urine culture is positive.  We also scheduled an appointment with urology on Monday, March 4 and that information was already communicated to patient today by clinic staff.  We will forward a copy of my office note and his lab results to his urologist.

## 2011-10-08 NOTE — Telephone Encounter (Signed)
Urinalysis shows pyuria but no bacteria seen; PSA has increased from 3.53 on 05/15/11 to 5.76 yesterday.  Plan is to have patient come in so we can collect a specimen for urine culture, and schedule a follow-up appointment with patient's urologist.  I tried unsuccessfully to reach patient by phone this AM, will ask clinic staff to contact him.

## 2011-10-09 ENCOUNTER — Other Ambulatory Visit: Payer: Medicare Other

## 2011-10-09 DIAGNOSIS — R8281 Pyuria: Secondary | ICD-10-CM

## 2011-10-11 LAB — URINE CULTURE: Organism ID, Bacteria: NO GROWTH

## 2011-10-14 ENCOUNTER — Other Ambulatory Visit: Payer: Self-pay | Admitting: Family Medicine

## 2011-10-14 ENCOUNTER — Ambulatory Visit (INDEPENDENT_AMBULATORY_CARE_PROVIDER_SITE_OTHER): Payer: Medicare Other | Admitting: Family Medicine

## 2011-10-14 VITALS — BP 140/80 | Ht 70.0 in | Wt 240.0 lb

## 2011-10-14 DIAGNOSIS — M75100 Unspecified rotator cuff tear or rupture of unspecified shoulder, not specified as traumatic: Secondary | ICD-10-CM

## 2011-10-14 DIAGNOSIS — M25519 Pain in unspecified shoulder: Secondary | ICD-10-CM

## 2011-10-14 DIAGNOSIS — M719 Bursopathy, unspecified: Secondary | ICD-10-CM

## 2011-10-14 MED ORDER — TRAMADOL HCL 50 MG PO TABS: 50.0000 mg | ORAL_TABLET | Freq: Four times a day (QID) | ORAL | Status: AC | PRN

## 2011-10-14 MED ORDER — METHYLPREDNISOLONE ACETATE 40 MG/ML IJ SUSP: 40.0000 mg | Freq: Once | INTRAMUSCULAR | Status: DC

## 2011-10-14 NOTE — Patient Instructions (Addendum)
1. Physical therapy will call you with your appointment.  2. Use ultram as needed for pain control.  3. Get an x ray of your shoulder done at least 2 days before your next visit.  You can get it done at [facility] in the radiology department.  No appointment is needed.  4. Follow up with me in 3-4 weeks.

## 2011-10-14 NOTE — Assessment & Plan Note (Signed)
Injection today.  His Cr is 1.36 so I will hold off on adding an NSAID at this time.  He will start ROM exercises at home while waiting for his formal physical therapy.  He will get an x ray to assess for arthritis before his next visit.  If he continues to have pain at his next visit we will do an ultrasound.

## 2011-10-14 NOTE — Progress Notes (Signed)
  Subjective:    Patient ID: KEESHAWN FAKHOURI, male    DOB: 01/01/1936, 76 y.o.   MRN: [id number]  HPI 76 y/o male is here c/o right shoulder pain x2-3 weeks.  The pain is worse with overhead movements.  He doesn't recall a specific injury.  He has night pain.   The pain is lateral and non-radiating.  There is no neck pain or posterior shoulder pain.  There is no numbness or pain distal to the elbow.   Review of Systems     Objective:   Physical Exam  Shoulder: Palpation is normal with no tenderness over AC joint or bicipital groove. Passive ROM is full, he has pain with active abduction above 90 but can abduct to 170.   IR to L5 with pain.  FF 180 ER 45 Rotator cuff strength decreased (4/5) Positive empty can, negative Hawkin's Speeds and Yergason's tests normal. Painful arc but no drop arm sign.  Consent obtained and verified. Cleansed with alcohol. Topical analgesic spray: Ethyl chloride. Joint: right subacromial Approached in typical fashion from the posterior aspect Completed without difficulty Meds: 40 gm depo medrol; 3 ml of lidocaine without epinephrine Needle: 25 g Aftercare instructions and Red flags advised.     Assessment & Plan:

## 2011-10-14 NOTE — Progress Notes (Signed)
Addended by: Sherrell Puller on: 10/14/2011 03:29 PM   Modules accepted: Orders

## 2011-11-09 ENCOUNTER — Ambulatory Visit (INDEPENDENT_AMBULATORY_CARE_PROVIDER_SITE_OTHER): Payer: Medicare Other | Admitting: Sports Medicine

## 2011-11-09 VITALS — BP 160/90

## 2011-11-09 DIAGNOSIS — M75100 Unspecified rotator cuff tear or rupture of unspecified shoulder, not specified as traumatic: Secondary | ICD-10-CM

## 2011-11-09 DIAGNOSIS — S43429A Sprain of unspecified rotator cuff capsule, initial encounter: Secondary | ICD-10-CM

## 2011-11-09 MED ORDER — NITROGLYCERIN 0.2 MG/HR TD PT24: MEDICATED_PATCH | TRANSDERMAL | Status: DC

## 2011-11-09 NOTE — Patient Instructions (Signed)
1. Do your exercises daily.  10-15 reps for 3 sets.  2. Use your patches daily.  3. Follow up in one month.

## 2011-11-11 DIAGNOSIS — M75100 Unspecified rotator cuff tear or rupture of unspecified shoulder, not specified as traumatic: Secondary | ICD-10-CM | POA: Insufficient documentation

## 2011-11-11 NOTE — Assessment & Plan Note (Signed)
We will start him on NTG protocol with gentle HEP.

## 2011-11-11 NOTE — Progress Notes (Signed)
  Subjective:    Patient ID: JOVANE FOUTZ, male    DOB: 09/06/1935, 76 y.o.   MRN: [id number]  HPI 76 y/o male is here to follow up for right sided non traumatic rotator cuff syndrome.  He had an injection at the last visit which improved his pain for only a few days.  The pain has returned in the same pattern as before.  Worse with overhead movement, reaching out, and it aches at night.   Review of Systems     Objective:   Physical Exam  Shoulder: Inspection reveals no abnormalities, atrophy or asymmetry. Palpation is normal with no tenderness over AC joint or bicipital groove. ROM is 150 on adbuction, FF 180, IR L5 Rotator cuff strength slightly decreased Positive Hawkin's tests, empty can. Speeds and Yergason's tests normal. Negative reach across Painful arc but no drop arm sign.   Ultrasound: Partial tear of the supraspinatus medially, no doppler activity.  There is fluid noted in the supraspinatus bursa.  The biceps, subscapularis, infraspinatus, and teres minor appear intact.      Assessment & Plan:

## 2011-11-18 ENCOUNTER — Encounter: Payer: Self-pay | Admitting: Internal Medicine

## 2011-11-18 ENCOUNTER — Ambulatory Visit: Payer: Medicare Other | Admitting: Internal Medicine

## 2011-11-18 ENCOUNTER — Ambulatory Visit (INDEPENDENT_AMBULATORY_CARE_PROVIDER_SITE_OTHER): Payer: Medicare Other | Admitting: Internal Medicine

## 2011-11-18 VITALS — BP 130/78 | HR 79 | Temp 97.3°F | Ht 70.0 in

## 2011-11-18 DIAGNOSIS — M25511 Pain in right shoulder: Secondary | ICD-10-CM

## 2011-11-18 DIAGNOSIS — D126 Benign neoplasm of colon, unspecified: Secondary | ICD-10-CM

## 2011-11-18 DIAGNOSIS — I10 Essential (primary) hypertension: Secondary | ICD-10-CM

## 2011-11-18 DIAGNOSIS — R82998 Other abnormal findings in urine: Secondary | ICD-10-CM

## 2011-11-18 DIAGNOSIS — C61 Malignant neoplasm of prostate: Secondary | ICD-10-CM

## 2011-11-18 DIAGNOSIS — R8281 Pyuria: Secondary | ICD-10-CM

## 2011-11-18 DIAGNOSIS — M25519 Pain in unspecified shoulder: Secondary | ICD-10-CM

## 2011-11-18 MED ORDER — TRAMADOL HCL 50 MG PO TABS: 50.0000 mg | ORAL_TABLET | Freq: Four times a day (QID) | ORAL | Status: DC | PRN

## 2011-11-18 NOTE — Assessment & Plan Note (Signed)
Assessment: Patient reports that he has followed up with his urologist, and has further workup scheduled there.  Plan: I advised patient to keep his follow up appointments as scheduled.

## 2011-11-18 NOTE — Progress Notes (Signed)
  Subjective:    Patient ID: [name], male    DOB: 06-Jun-1936, 76 y.o.   MRN: [id number]  HPI Patient returns for follow up of his hypertension, right shoulder pain, and other medical problems.  Since his last visit here, he has been seen in sports medicine and has follow up scheduled there.  He reports some improvement in his shoulder pain, and he requested a refill of the tramadol prescribed by the sports medicine physician.  He also reports that he saw his urologist as scheduled, and has follow up in the near future for lab work and with a physician.  He reports ongoing problems with urinary hesitancy and decreased urinary stream; he denies dysuria.  He has had occasional brief episodes of abdominal pain in the past; these last a few minutes and then resolve; he has no abdominal pain today.  He did not bring his medications to his visit today.  Review of Systems  Respiratory: Negative for shortness of breath.   Cardiovascular: Negative for chest pain and leg swelling.  Gastrointestinal: Negative for nausea, vomiting and blood in stool.  Genitourinary: Positive for difficulty urinating (Decreased stream; hesitancy). Negative for dysuria and hematuria.       Objective:   Physical Exam  Constitutional: No distress.  Cardiovascular: Normal rate, regular rhythm and normal heart sounds.  Exam reveals no gallop and no friction rub.   No murmur heard. Pulmonary/Chest: Effort normal and breath sounds normal. No respiratory distress. He has no wheezes. He has no rales.  Abdominal: Soft. Bowel sounds are normal. He exhibits no distension and no mass. There is no tenderness. There is no rebound and no guarding.  Musculoskeletal: He exhibits no edema.        Assessment & Plan:

## 2011-11-18 NOTE — Assessment & Plan Note (Signed)
Assessment: Probable rotator cuff syndrome.  Patient is being managed at sports medicine, and reports some improvement in his pain.  I refilled his tramadol today at his request.  Plan: Follow up with sports medicine as scheduled.

## 2011-11-18 NOTE — Assessment & Plan Note (Signed)
Lab Results  Component Value Date   NA 139 10/07/2011   K 4.1 10/07/2011   CL 107 10/07/2011   CO2 23 10/07/2011   BUN 14 10/07/2011   CREATININE 1.12 10/07/2011    BP Readings from Last 3 Encounters:  11/18/11 130/78  11/09/11 160/90  10/14/11 140/80    Assessment: Hypertension control:  controlled  Progress toward goals:  at goal Barriers to meeting goals:  no barriers identified  Plan: Hypertension treatment:  continue current medications

## 2011-11-18 NOTE — Assessment & Plan Note (Signed)
Assessment: Patient had pyuria noted on urinalysis at the last visit.  A follow-up urine culture was negative.  He was referred to urology for evaluation of the pyuria, an elevated PSA, and his difficulty voiding in the setting of a history of prostate cancer.  Plan: I advised patient to followup with his urologist as scheduled.

## 2011-11-18 NOTE — Assessment & Plan Note (Signed)
Assessment: Patient has not yet followed up with his gastroenterologist Dr. Bosie Clos, although I have advised him to do so on more than one occasion.  He does expresses willingness to schedule followup.  Plan: I again advised patient to schedule follow-up with Dr. Bosie Clos for a screening colonoscopy given his previous finding of an adenomatous colonic polyp

## 2011-11-18 NOTE — Patient Instructions (Signed)
Continue current medications. Please schedule a followup appointment with your gastroenterologist for screening colonoscopy. Please follow up with your urology doctor as scheduled.

## 2011-12-09 ENCOUNTER — Ambulatory Visit (INDEPENDENT_AMBULATORY_CARE_PROVIDER_SITE_OTHER): Payer: Medicare Other | Admitting: Family Medicine

## 2011-12-09 VITALS — BP 129/77

## 2011-12-09 DIAGNOSIS — M75100 Unspecified rotator cuff tear or rupture of unspecified shoulder, not specified as traumatic: Secondary | ICD-10-CM

## 2011-12-09 DIAGNOSIS — S43429A Sprain of unspecified rotator cuff capsule, initial encounter: Secondary | ICD-10-CM

## 2011-12-09 MED ORDER — TRAMADOL HCL 50 MG PO TABS: 50.0000 mg | ORAL_TABLET | Freq: Four times a day (QID) | ORAL | Status: DC | PRN

## 2011-12-09 MED ORDER — METHOCARBAMOL 750 MG PO TABS: ORAL_TABLET | ORAL | Status: DC

## 2011-12-09 NOTE — Patient Instructions (Signed)
1. I am giving you a medication called robaxin for night time pain.  Try taking just one of these pills before sleeping first because it can make you sleepy.  If you notice that you still have pain you can take up to 2 pills before sleeping at night but never more than 2.  2. I am refilling your pain pill.  It's called tramadol.  Don't take the tramadol and the robaxin at the same time because it will make you too sleepy.  3. Get your xray done at the hospital in the radiology department.  4. Keep wearing your patches everyday.  5. Keep doing your exercises everyday, at least once a day.  6. Follow up in one month.

## 2011-12-09 NOTE — Assessment & Plan Note (Addendum)
Emphasized the importance of the HEP for recovery.  He says that he will start doing it daily.  If he is unable to keep with a HEP then physical therapy may be helpful.  He is getting some relief with NTG alone but this is unlikely to lead to a full recovery.  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  He knows not to take robaxin and ultram at the same time.  Still awaiting the results of the x ray to evaluated his acromion type.

## 2011-12-09 NOTE — Progress Notes (Signed)
  Subjective:    Patient ID: [name], male    DOB: [birth year], 76 y.o.   MRN: [id number]  HPI 76 y/o male is here for follow up for a partial supraspinatus tear, right shoulder.  His work schedule is unchanged so he is doing a significant amount of overhead work.  He is wearing his NTG patch daily with no headaches or dizziness.  He has done his HEP a few times but is admittedly non-compliant.  He didn't get his xrays done at the hospital but is willing to get it done before his next visit.  His pain is improved during the day but he continues to have aching at night.   Review of Systems     Objective:   Physical Exam  Right shoulder: ROM much improved from previous visit, near normal Still has positive impingement and general  Rotator cuff weakness No drop arm      Assessment & Plan:

## 2011-12-12 ENCOUNTER — Other Ambulatory Visit: Payer: Self-pay | Admitting: Internal Medicine

## 2011-12-25 ENCOUNTER — Ambulatory Visit (HOSPITAL_COMMUNITY)
Admission: RE | Admit: 2011-12-25 | Discharge: 2011-12-25 | Disposition: A | Discharge status: Home / Self Care | Payer: Medicare Other | Source: Ambulatory Visit | Attending: Family Medicine | Admitting: Family Medicine

## 2011-12-25 DIAGNOSIS — M25519 Pain in unspecified shoulder: Secondary | ICD-10-CM

## 2011-12-25 DIAGNOSIS — G8929 Other chronic pain: Secondary | ICD-10-CM | POA: Insufficient documentation

## 2011-12-30 ENCOUNTER — Other Ambulatory Visit: Payer: Self-pay | Admitting: Urology

## 2011-12-30 DIAGNOSIS — C61 Malignant neoplasm of prostate: Secondary | ICD-10-CM

## 2012-01-11 ENCOUNTER — Encounter (HOSPITAL_COMMUNITY)
Admission: RE | Admit: 2012-01-11 | Discharge: 2012-01-11 | Disposition: A | Discharge status: Home / Self Care | Payer: Medicare Other | Source: Ambulatory Visit | Attending: Urology | Admitting: Urology

## 2012-01-11 ENCOUNTER — Ambulatory Visit: Payer: Medicare Other | Admitting: Sports Medicine

## 2012-01-11 DIAGNOSIS — M25579 Pain in unspecified ankle and joints of unspecified foot: Secondary | ICD-10-CM | POA: Insufficient documentation

## 2012-01-11 DIAGNOSIS — C61 Malignant neoplasm of prostate: Secondary | ICD-10-CM | POA: Insufficient documentation

## 2012-01-11 MED ORDER — TECHNETIUM TC 99M MEDRONATE IV KIT
25.0000 | PACK | Freq: Once | INTRAVENOUS | Status: AC | PRN
  Administered 2012-01-11: 25 via INTRAVENOUS

## 2012-01-29 ENCOUNTER — Ambulatory Visit: Payer: Medicare Other | Admitting: Family Medicine

## 2012-04-13 ENCOUNTER — Encounter: Payer: Self-pay | Admitting: Internal Medicine

## 2012-04-13 ENCOUNTER — Ambulatory Visit (INDEPENDENT_AMBULATORY_CARE_PROVIDER_SITE_OTHER): Payer: Medicare Other | Admitting: Internal Medicine

## 2012-04-13 VITALS — BP 137/85 | HR 73 | Temp 97.5°F | Ht 70.0 in | Wt 235.3 lb

## 2012-04-13 DIAGNOSIS — I10 Essential (primary) hypertension: Secondary | ICD-10-CM

## 2012-04-13 DIAGNOSIS — L259 Unspecified contact dermatitis, unspecified cause: Secondary | ICD-10-CM

## 2012-04-13 DIAGNOSIS — B952 Enterococcus as the cause of diseases classified elsewhere: Secondary | ICD-10-CM | POA: Insufficient documentation

## 2012-04-13 DIAGNOSIS — R609 Edema, unspecified: Secondary | ICD-10-CM

## 2012-04-13 DIAGNOSIS — R300 Dysuria: Secondary | ICD-10-CM

## 2012-04-13 DIAGNOSIS — D126 Benign neoplasm of colon, unspecified: Secondary | ICD-10-CM

## 2012-04-13 DIAGNOSIS — R600 Localized edema: Secondary | ICD-10-CM | POA: Insufficient documentation

## 2012-04-13 DIAGNOSIS — C61 Malignant neoplasm of prostate: Secondary | ICD-10-CM

## 2012-04-13 DIAGNOSIS — N390 Urinary tract infection, site not specified: Secondary | ICD-10-CM | POA: Insufficient documentation

## 2012-04-13 LAB — CBC WITH DIFFERENTIAL/PLATELET
Basophils Absolute: 0 10*3/uL (ref 0.0–0.1)
Basophils Relative: 0 % (ref 0–1)
Eosinophils Relative: 4 % (ref 0–5)
HCT: 44 % (ref 39.0–52.0)
Hemoglobin: 15.5 g/dL (ref 13.0–17.0)
MCH: 31.3 pg (ref 26.0–34.0)
MCHC: 35.2 g/dL (ref 30.0–36.0)
MCV: 88.7 fL (ref 78.0–100.0)
Monocytes Absolute: 0.7 10*3/uL (ref 0.1–1.0)
Monocytes Relative: 10 % (ref 3–12)
RDW: 14.4 % (ref 11.5–15.5)

## 2012-04-13 MED ORDER — TRAMADOL HCL 50 MG PO TABS: 50.0000 mg | ORAL_TABLET | Freq: Four times a day (QID) | ORAL | Status: DC | PRN

## 2012-04-13 MED ORDER — CLOBETASOL PROPIONATE 0.05 % EX OINT: 1.0000 "application " | TOPICAL_OINTMENT | Freq: Every day | CUTANEOUS | Status: DC

## 2012-04-13 MED ORDER — NAPROXEN 250 MG PO TABS: ORAL_TABLET | ORAL | Status: DC

## 2012-04-13 MED ORDER — NIFEDIPINE ER OSMOTIC RELEASE 60 MG PO TB24: 60.0000 mg | ORAL_TABLET | Freq: Every day | ORAL | Status: DC

## 2012-04-13 MED ORDER — TRIAMCINOLONE ACETONIDE 0.1 % EX CREA: 1.0000 "application " | TOPICAL_CREAM | CUTANEOUS | Status: DC

## 2012-04-13 MED ORDER — ENALAPRIL MALEATE 2.5 MG PO TABS: 2.5000 mg | ORAL_TABLET | Freq: Every day | ORAL | Status: DC

## 2012-04-13 NOTE — Assessment & Plan Note (Signed)
Assessment: Patient has a history of prostate cancer and says he is followed by Dr. Retta Diones.  He apparently has not seen his urologist anytime recently.  Plan: Will check a urinalysis and urine culture today; I advised him to followup with his urologist given his history of prostate cancer and his current symptoms.

## 2012-04-13 NOTE — Assessment & Plan Note (Signed)
I again advised patient to followup with Dr. Bosie Clos for surveillance colonoscopy, and I again explained to him the importance of doing so given his history of adenomatous colonic polyp and the risk of developing colon cancer.

## 2012-04-13 NOTE — Assessment & Plan Note (Signed)
Assessment: Patient reports that he is followed by Dr. Retta Diones.  He has not seen him recently by his report.  Plan: I advised patient to follow up with Dr. Retta Diones in the near future, especially given his symptoms of dysuria and urinary hesitancy.

## 2012-04-13 NOTE — Progress Notes (Signed)
  Subjective:    Patient ID: [name], male    DOB: 1936/06/17, 76 y.o.   MRN: [id number]  HPI Patient returns for followup of his hypertension and other chronic medical problems. His main complaint today is bilateral leg edema which has been present for 2-3 months.  He also reports recent dysuria and urinary hesitancy.  He did not bring his medications to clinic today.  He reports that he has been compliant with his antihypertensive treatment.  He has not recently seen his urologist, and he has not scheduled a followup appointment with his GI physician for surveillance colonoscopy.   Review of Systems  Constitutional: Negative for fever, chills, diaphoresis and appetite change.  Respiratory: Negative for shortness of breath.   Cardiovascular: Positive for leg swelling. Negative for chest pain.  Gastrointestinal: Negative for nausea, vomiting and abdominal pain.  Genitourinary: Positive for dysuria, frequency and difficulty urinating. Negative for flank pain.  Musculoskeletal: Negative for back pain.       Objective:   Physical Exam  Constitutional: No distress.  Cardiovascular: Normal rate, regular rhythm and normal heart sounds.  Exam reveals no gallop and no friction rub.   No murmur heard.      1+ bilateral pitting leg edema  Pulmonary/Chest: Effort normal and breath sounds normal. He has no wheezes. He has no rales.  Abdominal: Soft. Bowel sounds are normal. He exhibits no distension. There is no tenderness. There is no rebound and no guarding.       Assessment & Plan:

## 2012-04-13 NOTE — Assessment & Plan Note (Signed)
Lab Results  Component Value Date   NA 139 10/07/2011   K 4.1 10/07/2011   CL 107 10/07/2011   CO2 23 10/07/2011   BUN 14 10/07/2011   CREATININE 1.12 10/07/2011    BP Readings from Last 3 Encounters:  04/13/12 137/85  12/09/11 129/77  11/18/11 130/78    Assessment: Hypertension control:  controlled  Progress toward goals:  at goal Barriers to meeting goals:  adverse effects of medications (bilateral leg edema possibly aggravated by nifedipine XL).  Plan: Hypertension treatment:  Plan is to stop nifedipine XL and start enalapril 2.5 mg daily; will titrate enalapril upward if needed to achieve adequate blood pressure control.  Will need to follow potassium and renal function since he is now on an ACE inhibitor

## 2012-04-13 NOTE — Patient Instructions (Addendum)
1.  Stop nifedipine XL (Procardia XL). 2.  Start enalapril (Vasotec) 2.5 mg one tablet daily for your blood pressure. 3.  Please schedule an appointment with your urologist for followup of your urinary symptoms.  4.  Please schedule an appointment with your GI physician for repeat colonoscopy to followup your previous finding of a colon polyp.

## 2012-04-13 NOTE — Assessment & Plan Note (Signed)
Assessment: The etiology of patient's bilateral leg edema is not clear; he had a Myoview done last year which showed a normal left ventricular ejection fraction.  He has no symptoms of left heart failure.  His antihypertensive medication (nifedipine XL) may be aggravating the problem.  Plan: Given patient's urinary problems, I do not want to start a diuretic at this time.  The plan is to stop Procardia XL and start a low dose of enalapril 2.5 mg daily as treatment of his hypertension; will check labs including a metabolic panel, TSH, and CBC, as well as 2-D echocardiogram.

## 2012-04-14 LAB — COMPLETE METABOLIC PANEL WITH GFR
ALT: 33 U/L (ref 0–53)
AST: 23 U/L (ref 0–37)
Albumin: 4.1 g/dL (ref 3.5–5.2)
Alkaline Phosphatase: 42 U/L (ref 39–117)
GFR, Est Non African American: 60 mL/min
Glucose, Bld: 85 mg/dL (ref 70–99)
Potassium: 4.3 mEq/L (ref 3.5–5.3)
Sodium: 138 mEq/L (ref 135–145)
Total Protein: 7.3 g/dL (ref 6.0–8.3)

## 2012-04-14 LAB — URINALYSIS, ROUTINE W REFLEX MICROSCOPIC
Glucose, UA: NEGATIVE mg/dL
Protein, ur: 30 mg/dL — AB
pH: 5.5 (ref 5.0–8.0)

## 2012-04-14 LAB — URINALYSIS, MICROSCOPIC ONLY: WBC, UA: >50 WBC/hpf — AB (ref <3)

## 2012-04-15 ENCOUNTER — Telehealth: Payer: Self-pay | Admitting: Internal Medicine

## 2012-04-15 DIAGNOSIS — B952 Enterococcus as the cause of diseases classified elsewhere: Secondary | ICD-10-CM

## 2012-04-15 MED ORDER — AMPICILLIN 500 MG PO CAPS: 500.0000 mg | ORAL_CAPSULE | Freq: Four times a day (QID) | ORAL | Status: AC

## 2012-04-15 NOTE — Telephone Encounter (Signed)
Urinalysis from 04/13/2012 was consistent with urinary tract infection, and urine culture is now growing greater than 100,000, colonies per mL Enterococcus species with susceptibilities pending.  The plan is to treat with ampicillin 500 mg 4 times a day for one week.  I called patient and discussed this with him today.  He confirmed that he has no history of penicillin allergy.  He has no new symptoms compared to his office visit 04/13/2012.  When susceptibilities come back, can assess antibiotic regimen and revise if needed.

## 2012-04-15 NOTE — Assessment & Plan Note (Signed)
Urinalysis from 04/13/2012 was consistent with urinary tract infection, and urine culture is now growing greater than 100,000, colonies per mL Enterococcus species with susceptibilities pending.  The plan is to treat with ampicillin 500 mg 4 times a day for one week.  I called patient and discussed this with him today.  He confirmed that he has no history of penicillin allergy.  He has no new symptoms compared to his office visit 04/13/2012.  When susceptibilities come back, can assess antibiotic regimen and revise if needed. 

## 2012-04-16 LAB — URINE CULTURE

## 2012-05-19 ENCOUNTER — Ambulatory Visit (INDEPENDENT_AMBULATORY_CARE_PROVIDER_SITE_OTHER): Payer: Medicare Other | Admitting: *Deleted

## 2012-05-19 DIAGNOSIS — Z23 Encounter for immunization: Secondary | ICD-10-CM

## 2012-05-19 DIAGNOSIS — Z299 Encounter for prophylactic measures, unspecified: Secondary | ICD-10-CM

## 2012-07-14 ENCOUNTER — Ambulatory Visit (INDEPENDENT_AMBULATORY_CARE_PROVIDER_SITE_OTHER): Payer: Medicare Other | Admitting: Radiation Oncology

## 2012-07-14 VITALS — BP 181/94 | HR 54

## 2012-07-14 DIAGNOSIS — I10 Essential (primary) hypertension: Secondary | ICD-10-CM

## 2012-07-14 LAB — BASIC METABOLIC PANEL
Chloride: 103 mEq/L (ref 96–112)
Potassium: 4 mEq/L (ref 3.5–5.3)
Sodium: 138 mEq/L (ref 135–145)

## 2012-07-14 MED ORDER — LISINOPRIL-HYDROCHLOROTHIAZIDE 10-12.5 MG PO TABS: 1.0000 | ORAL_TABLET | Freq: Every day | ORAL | Status: DC

## 2012-07-14 NOTE — Patient Instructions (Addendum)
Hypertension Hypertension is another name for high blood pressure. High blood pressure may mean that your heart needs to work harder to pump blood. Blood pressure consists of two numbers, which includes a higher number over a lower number (example: 110/72). HOME CARE    Make lifestyle changes as told by your doctor. This may include weight loss and exercise.   Take your blood pressure medicine every day.   Limit how much salt you use.   Stop smoking if you smoke.   Do not use drugs.   Talk to your doctor if you are using decongestants or birth control pills. These medicines might make blood pressure higher.   Females should not drink more than 1 alcoholic drink per day. Males should not drink more than 2 alcoholic drinks per day.   See your doctor as told.  GET HELP RIGHT AWAY IF:    You have a blood pressure reading with a top number of 180 or higher.   You get a very bad headache.   You get blurred or changing vision.   You feel confused.   You feel weak, numb, or faint.   You get chest or belly (abdominal) pain.   You throw up (vomit).   You cannot breathe very well.  MAKE SURE YOU:    Understand these instructions.   Will watch your condition.   Will get help right away if you are not doing well or get worse.  Document Released: 01/13/2008 Document Revised: 10/19/2011 Document Reviewed: 01/13/2008 [facility] Patient Information 2013 [place], [place].  Hydrochlorothiazide, HCTZ; Lisinopril tablets What is this medicine? HYDROCHLOROTHIAZIDE; LISINOPRIL (hye droe klor oh THYE a zide; lyse IN oh pril) is a combination of a diuretic and an ACE inhibitor. It is used to treat high blood pressure. This medicine may be used for other purposes; ask your health care provider or pharmacist if you have questions. What should I tell my health care provider before I take this medicine? They need to know if you have any of these conditions: -bone marrow disease -decreased  urine -heart or blood vessel disease -if you are on a special diet like a low salt diet -immune system problems, like lupus -kidney disease -liver disease -previous swelling of the tongue, face, or lips with difficulty breathing, difficulty swallowing, hoarseness, or tightening of the throat -recent heart attack or stroke -an unusual or allergic reaction to lisinopril, hydrochlorothiazide, sulfa drugs, other medicines, insect venom, foods, dyes, or preservatives -pregnant or trying to get pregnant -breast-feeding How should I use this medicine? Take this medicine by mouth with a glass of water. Follow the directions on the prescription label. You can take it with or without food. If it upsets your stomach, take it with food. Take your medicine at regular intervals. Do not take it more often than directed. Do not stop taking except on your doctor's advice. Talk to your pediatrician regarding the use of this medicine in children. Special care may be needed. Overdosage: If you think you have taken too much of this medicine contact a poison control center or emergency room at once. NOTE: This medicine is only for you. Do not share this medicine with others. What if I miss a dose? If you miss a dose, take it as soon as you can. If it is almost time for your next dose, take only that dose. Do not take double or extra doses. What may interact with this medicine? -barbiturates like phenobarbital -blood pressure medicines -corticosteroids like prednisone -diabetic  medications -diuretics, especially triamterene, spironolactone or amiloride -lithium -NSAIDs like ibuprofen -potassium salts or potassium supplements -prescription pain medicines -skeletal muscle relaxants like tubocurarine -some cholesterol lowering medications like cholestyramine or colestipol This list may not describe all possible interactions. Give your health care provider a list of all the medicines, herbs, non-prescription  drugs, or dietary supplements you use. Also tell them if you smoke, drink alcohol, or use illegal drugs. Some items may interact with your medicine. What should I watch for while using this medicine? Visit your doctor or health care professional for regular checks on your progress. Check your blood pressure as directed. Ask your doctor or health care professional what your blood pressure should be and when you should contact him or her. Call your doctor or health care professional if you notice an irregular or fast heart beat. You must not get dehydrated. Ask your doctor or health care professional how much fluid you need to drink a day. Check with him or her if you get an attack of severe diarrhea, nausea and vomiting, or if you sweat a lot. The loss of too much body fluid can make it dangerous for you to take this medicine. Women should inform their doctor if they wish to become pregnant or think they might be pregnant. There is a potential for serious side effects to an unborn child. Talk to your health care professional or pharmacist for more information. You may get drowsy or dizzy. Do not drive, use machinery, or do anything that needs mental alertness until you know how this drug affects you. Do not stand or sit up quickly, especially if you are an older patient. This reduces the risk of dizzy or fainting spells. Alcohol can make you more drowsy and dizzy. Avoid alcoholic drinks. This medicine may affect your blood sugar level. If you have diabetes, check with your doctor or health care professional before changing the dose of your diabetic medicine. Avoid salt substitutes unless you are told otherwise by your doctor or health care professional. This medicine can make you more sensitive to the sun. Keep out of the sun. If you cannot avoid being in the sun, wear protective clothing and use sunscreen. Do not use sun lamps or tanning beds/booths. Do not treat yourself for coughs, colds, or pain while  you are taking this medicine without asking your doctor or health care professional for advice. Some ingredients may increase your blood pressure. What side effects may I notice from receiving this medicine? Side effects that you should report to your doctor or health care professional as soon as possible: -changes in vision -confusion, dizziness, light headedness or fainting spells -decreased amount of urine passed -difficulty breathing or swallowing, hoarseness, or tightening of the throat -eye pain -fast or irregular heart beat, palpitations, or chest pain -muscle cramps -nausea and vomiting -persistent dry cough -redness, blistering, peeling or loosening of the skin, including inside the mouth -stomach pain -swelling of your face, lips, tongue, hands, or feet -unusual rash, bleeding or bruising, or pinpoint red spots on the skin -worsened gout pain -yellowing of the eyes or skin Side effects that usually do not require medical attention (report to your doctor or health care professional if they continue or are bothersome): -change in sex drive or performance -cough -headache This list may not describe all possible side effects. Call your doctor for medical advice about side effects. You may report side effects to FDA at 1-800-FDA-1088. Where should I keep my medicine? Keep out of the  reach of children. Store at room temperature between 20 and 25 degrees C (68 and 77 degrees F). Protect from moisture and excessive light. Keep container tightly closed. Throw away any unused medicine after the expiration date. NOTE: This sheet is a summary. It may not cover all possible information. If you have questions about this medicine, talk to your doctor, pharmacist, or health care provider.  2012, [employer]. (04/16/2010 1:33:52 PM)  General Instructions: Stop taking enalapril. Start taking lisinopril-HCTZ. Please return to clinic in approximately one month to follow-up on your blood  pressure. If you develop any new symptoms please contact the clinic or go to urgent care/emergency room.

## 2012-07-14 NOTE — Assessment & Plan Note (Addendum)
BP ~190/110 (181/94 on repeat). Asymptomatic. Pt's HTN is worsened since his visit in 04/2012 at which time he started an ACE-inhibitor and d/c'd his CCB, at which time his HTN was adequately controlled. Pt was not deemed a candidate for diuretic therapy in 04/2012 due to a UTI at that time, however this issue has since resolved, and thus addition of a diuretic to the pt's regimen is appropriate. ACE-I therapy will also be escalated, as pt has been on this med for 3 months and BMET is within normal limits.  - d/c enalapril 2.5mg  - start lisinopril-HCTZ 10-12.5mg  - f/u within 1 month with PCP (Dr. Meredith Pel)  BP Readings from Last 3 Encounters:  07/14/12 181/94  04/13/12 137/85  12/09/11 129/77    Sodium  Date Value Range Status  07/14/2012 138  135 - 145 mEq/L Final     Potassium  Date Value Range Status  07/14/2012 4.0  3.5 - 5.3 mEq/L Final     Creat  Date Value Range Status  07/14/2012 1.19  0.50 - 1.35 mg/dL Final    Assessment:  Blood pressure control: moderately elevated  Progress toward BP goal:  deteriorated  Comments:   Plan:  Medications:  D/c enalapril, start lisinopril-HCTZ  Educational resources provided:    Self management tools provided:    Other plans: f/u within 1 month

## 2012-07-14 NOTE — Progress Notes (Signed)
  Subjective:    Patient ID: [name], male    DOB: Jan 13, 1936, 76 y.o.   MRN: [id number]  HPI Pt presents with complaints of elevated BP. Pt had a HH nurse who visits 1x/year check his vitals this AM and discovered that his BP was elevated at ~190/110. The nurse told the pt he should see his doctor today for the issue. Pt states he feels fine, and has no complaints. Denies any headache, dizziness, or blurry vision. Denies CP/SOB or leg swelling (which improved since d/c of CCB in 04/2012). Denies any other complaints. In 04/2012 pt's CCB was d/c'd and enalapril 2.5mg  QD was started in its place. Pt states he has been taking this medication as prescribed.    Review of Systems  Constitutional: Negative for fever and chills.  HENT: Negative.   Respiratory: Negative for cough, shortness of breath and wheezing.   Cardiovascular: Negative for chest pain, palpitations and leg swelling.  Gastrointestinal: Negative for nausea, vomiting, abdominal pain and diarrhea.  Genitourinary: Negative for dysuria and hematuria.  Skin: Negative.   Neurological: Negative for dizziness, light-headedness and headaches.  Hematological: Negative.   Psychiatric/Behavioral: Negative.        Objective:   Physical Exam  Constitutional: He is oriented to person, place, and time. He appears well-developed and well-nourished. No distress.  HENT:  Head: Normocephalic and atraumatic.  Mouth/Throat: Oropharynx is clear and moist.  Eyes: Pupils are equal, round, and reactive to light. No scleral icterus.  Neck: Normal range of motion. Neck supple. No tracheal deviation present. No thyromegaly present.  Cardiovascular: Normal rate and regular rhythm.   No murmur heard. Pulmonary/Chest: Effort normal. He has no wheezes. He has no rales.  Abdominal: Soft. Bowel sounds are normal. He exhibits no distension. There is no tenderness.  Musculoskeletal: Normal range of motion. He exhibits no edema.  Neurological: He is  alert and oriented to person, place, and time. No cranial nerve deficit.  Skin: Skin is dry. No erythema.  Psychiatric: He has a normal mood and affect. His behavior is normal.          Assessment & Plan:

## 2012-08-30 ENCOUNTER — Other Ambulatory Visit: Payer: Self-pay | Admitting: Radiation Oncology

## 2012-08-30 NOTE — Telephone Encounter (Signed)
This was started in Dec. Requested 1 month FU. No appt given. Pls sch short OPC slot for BP and BMP. Thanks

## 2012-09-05 ENCOUNTER — Telehealth: Payer: Self-pay | Admitting: *Deleted

## 2012-09-05 NOTE — Telephone Encounter (Signed)
Call from [place], pt's spouce stating pt has had cough for 1 month without improvement. Cough is dry, tickling in throat. Cough is all day.  Denies fever, intake is good.no other complaints. He has taken mucinex without relief. Pt has schedule appointment with Dr Meredith Pel 2/12, can you call in something else for him to take or do you want him seen? Pt # [id number]

## 2012-09-05 NOTE — Telephone Encounter (Signed)
Pt scheduled for 8:45 wednesday

## 2012-09-05 NOTE — Telephone Encounter (Signed)
He should be seen this week in [facility].

## 2012-09-07 ENCOUNTER — Ambulatory Visit: Payer: Medicare Other | Admitting: Internal Medicine

## 2012-09-08 ENCOUNTER — Ambulatory Visit (INDEPENDENT_AMBULATORY_CARE_PROVIDER_SITE_OTHER): Payer: Medicare Other | Admitting: Internal Medicine

## 2012-09-08 ENCOUNTER — Encounter: Payer: Self-pay | Admitting: Internal Medicine

## 2012-09-08 VITALS — BP 150/82 | HR 56 | Temp 97.7°F | Ht 70.0 in | Wt 232.2 lb

## 2012-09-08 DIAGNOSIS — I10 Essential (primary) hypertension: Secondary | ICD-10-CM

## 2012-09-08 DIAGNOSIS — R05 Cough: Secondary | ICD-10-CM | POA: Insufficient documentation

## 2012-09-08 MED ORDER — BENZOCAINE-MENTHOL 4-9 MG MT LOZG: 1.0000 [drp] | LOZENGE | Freq: Two times a day (BID) | OROMUCOSAL | Status: DC

## 2012-09-08 MED ORDER — LORATADINE 10 MG PO TABS: 10.0000 mg | ORAL_TABLET | Freq: Every day | ORAL | Status: DC

## 2012-09-08 MED ORDER — HYDROCHLOROTHIAZIDE 12.5 MG PO TABS: 25.0000 mg | ORAL_TABLET | Freq: Every day | ORAL | Status: DC

## 2012-09-08 MED ORDER — LISINOPRIL 10 MG PO TABS: 10.0000 mg | ORAL_TABLET | Freq: Every day | ORAL | Status: DC

## 2012-09-08 MED ORDER — BENZONATATE 100 MG PO CAPS: 100.0000 mg | ORAL_CAPSULE | Freq: Three times a day (TID) | ORAL | Status: DC | PRN

## 2012-09-08 MED ORDER — ESOMEPRAZOLE MAGNESIUM 20 MG PO CPDR: 20.0000 mg | DELAYED_RELEASE_CAPSULE | Freq: Every day | ORAL | Status: DC

## 2012-09-08 NOTE — Assessment & Plan Note (Addendum)
Patient presents with one month of dry cough. We discussed the numerous possible etiologies of this problem including reaction to ACE inhibitor, bronchitis, allergies, GERD. Patient was hesitant, but agreed to try ruling out other causes of cough before labeling it as an ACE inhibitor reaction.  -Will try loratadine, Nexium, Tessalon, cough drops -Will give separate prescriptions for HCTZ and lisinopril as patient may need to switch from an ACEI to an ARB -Return to clinic in 2 weeks to reevaluate cough, may potentially discontinue lisinopril if the dry cough continues

## 2012-09-08 NOTE — Progress Notes (Signed)
Internal Medicine Clinic Visit    HPI:  [name] is a 77 y.o. year old male history of hypertension, prostate cancer, OSA, osteoarthritis, who presents with a chief complaint of cough.  Has been coughing for about one month. Worse at night. Very bothersome. Driving him "crazy". Has been taking lisinopril for 2 months. No congestion, no fever, no chills, no runny nose currently, but he states that at the beginning of the month he had these symptoms and thought he had a cold. Very rare phlegm production. Last week has had ear problems, head congestion in ears, has a rumbling and echo sound in ears, worse on the right, has to clear them. No facial pain. No decreased hearing that he can tell. No pain in ears.  Notices cough when sitting down, worse at night. Feels a burning and itchiness in his chest and the back of his throat. No abd pain or burning after meals.   No pain with urination, much improved, no hematuria. No fever or chills.  No chest pain, no SOB. Did have some CP when the coughing first started, left sided, pain only when coughing. No diaphoresis.   Past Medical History  Diagnosis Date  . Hypertension   . Prostate cancer 2009    Stage T1c diagnosed by Dr. Marcelyn Bruins; treated with Lupron followed by external beam radiation and brachytherapy.  . OSA (obstructive sleep apnea)     Severe by diagnostic polysomnogram 06/22/2004.  Marland Kitchen Benign neoplasm of bladder     S/P transurethral resection of lesion of right anterior bladder neck on  12/19/2008; S/P transurethral resection of bladder tumor (small) and transurethral resection of bladder neck on 7/13 2010 by Dr. Vic Blackbird  . Dysuria   . Urge incontinence   . Adenomatous colon polyp 12/14/2005    5 mm polyp in descending colon, found on colonoscopy by Dr. Charlott Rakes  . Occult blood in stools   . Hand eczema   . BPH (benign prostatic hypertrophy)   . Insomnia   . Allergic rhinitis   . History of hepatitis B   .  Hepatitis C     Hepatitis C RNA Quant on July 06, 2007 showed no detectable virus.  . ED (erectile dysfunction)   . Primary osteoarthritis of both knees     S/P bilateral knee replacements.    Past Surgical History  Procedure Date  . Transurethral resection of bladder tumor 2010    S/P transurethral resection of lesion of right anterior bladder neck on  12/19/2008; S/P transurethral resection of bladder tumor (small) and transurethral resection of bladder neck on 7/13 2010 by Dr. Londell Moh Kimbrough     ROS:  A complete review of systems was otherwise negative, except as noted in the HPI.  Allergies: Aspirin  Medications: Current Outpatient Prescriptions  Medication Sig Dispense Refill  . clobetasol ointment (TEMOVATE) 0.05 % Apply 1 application topically at bedtime. Apply to affected areas on hands.  30 g  1  . esomeprazole (NEXIUM) 40 MG capsule Take 1 capsule (40 mg total) by mouth daily.  30 capsule  1  . lisinopril-hydrochlorothiazide (PRINZIDE,ZESTORETIC) 10-12.5 MG per tablet TAKE 1 TABLET BY MOUTH EVERY DAY  30 tablet  0  . naproxen (NAPROSYN) 250 MG tablet Take 1 tablet twice a day as needed for pain; take with food.  40 tablet  1  . nitroGLYCERIN (NITRODUR - DOSED IN MG/24 HR) 0.2 mg/hr Place one quarter patch on shoulder, change daily  30 patch  11  . nitroGLYCERIN (NITROSTAT) 0.4 MG SL tablet Place 1 tablet (0.4 mg total) under the tongue every 5 (five) minutes as needed for chest pain.  30 tablet  2  . traMADol (ULTRAM) 50 MG tablet Take 1 tablet (50 mg total) by mouth every 6 (six) hours as needed for pain.  30 tablet  2  . triamcinolone cream (KENALOG) 0.1 % Apply 1 application topically every morning. Apply to affected areas on hands.  30 g  1    History   Social History  . Marital Status: Married    Spouse Name: N/A    Number of Children: N/A  . Years of Education: N/A   Occupational History  . Not on file.   Social History Main Topics  . Smoking status:  Former Smoker -- 0.2 packs/day for 25 years    Quit date: 11/26/1999  . Smokeless tobacco: Never Used  . Alcohol Use: No  . Drug Use: No  . Sexually Active: Not on file   Other Topics Concern  . Not on file   Social History Narrative  . No narrative on file    family history includes Heart attack (age of onset:80) in his mother.  There is no history of Cancer and Diabetes.  Physical Exam There were no vitals taken for this visit. General:  No acute distress, alert and oriented x 3, well-appearing  HEENT:  PERRL, EOMI, no lymphadenopathy, moist mucous membranes, oropharynx clear, no exudate. Right ear canal clear,  superior erythema on the tympanic membrane as well as an arc of inferior erythema. No obvious bulges or exudate. Left ear canal clear, tympanic membrane normal. Patient did not cough during the exam or interview. Cardiovascular:  Regular rate and rhythm, no murmurs, rubs or gallops Respiratory:  Clear to auscultation bilaterally, no wheezes, rales, or rhonchi Abdomen:  Soft, nondistended, nontender, normoactive bowel sounds Extremities:  Warm and well-perfused, no clubbing, cyanosis, or edema.  Skin: Warm, dry, no rashes Neuro: Not anxious appearing, no depressed mood, normal affect  Labs: Lab Results  Component Value Date   CREATININE 1.19 07/14/2012   BUN 9 07/14/2012   NA 138 07/14/2012   K 4.0 07/14/2012   CL 103 07/14/2012   CO2 26 07/14/2012   Lab Results  Component Value Date   WBC 6.3 04/13/2012   HGB 15.5 04/13/2012   HCT 44.0 04/13/2012   MCV 88.7 04/13/2012   PLT 198 04/13/2012      Assessment and Plan:    FOLLOWUP: [name] will follow back up in our clinic in approximately 2 weeks. [name] knows to call out clinic in the meantime with any questions or new issues.

## 2012-09-08 NOTE — Assessment & Plan Note (Addendum)
Patient's blood pressure remains elevated today though it is improved since the last visit. 152/82 today. Patient states his been taking his medications as instructed.  -DC lisinopril-HCTZ combo pill -Will increase the dose of HCTZ to 25 mg -Continue lisinopril 10 mg -Check BMP today -Return to clinic in 2-4 weeks -Patient may need to switch to an ARB if his symptoms of dry cough continue

## 2012-09-08 NOTE — Patient Instructions (Addendum)
Please return to clinic in 2 weeks, sooner if needed.

## 2012-09-09 LAB — BASIC METABOLIC PANEL WITH GFR
CO2: 27 mEq/L (ref 19–32)
Chloride: 106 mEq/L (ref 96–112)
GFR, Est African American: 73 mL/min
Glucose, Bld: 78 mg/dL (ref 70–99)
Potassium: 4.1 mEq/L (ref 3.5–5.3)
Sodium: 141 mEq/L (ref 135–145)

## 2012-09-18 ENCOUNTER — Encounter (HOSPITAL_COMMUNITY): Payer: Self-pay | Admitting: Emergency Medicine

## 2012-09-18 ENCOUNTER — Emergency Department (HOSPITAL_COMMUNITY): Payer: Medicare Other

## 2012-09-18 ENCOUNTER — Emergency Department (HOSPITAL_COMMUNITY)
Admission: EM | Admit: 2012-09-18 | Discharge: 2012-09-19 | Disposition: A | Discharge status: Home / Self Care | Payer: Medicare Other | Attending: Emergency Medicine | Admitting: Emergency Medicine

## 2012-09-18 DIAGNOSIS — Z79899 Other long term (current) drug therapy: Secondary | ICD-10-CM | POA: Insufficient documentation

## 2012-09-18 DIAGNOSIS — Z860100 Personal history of colon polyps, unspecified: Secondary | ICD-10-CM | POA: Insufficient documentation

## 2012-09-18 DIAGNOSIS — I10 Essential (primary) hypertension: Secondary | ICD-10-CM | POA: Insufficient documentation

## 2012-09-18 DIAGNOSIS — Z87891 Personal history of nicotine dependence: Secondary | ICD-10-CM | POA: Insufficient documentation

## 2012-09-18 DIAGNOSIS — G4733 Obstructive sleep apnea (adult) (pediatric): Secondary | ICD-10-CM | POA: Insufficient documentation

## 2012-09-18 DIAGNOSIS — Z87448 Personal history of other diseases of urinary system: Secondary | ICD-10-CM | POA: Insufficient documentation

## 2012-09-18 DIAGNOSIS — Z8546 Personal history of malignant neoplasm of prostate: Secondary | ICD-10-CM | POA: Insufficient documentation

## 2012-09-18 DIAGNOSIS — R34 Anuria and oliguria: Secondary | ICD-10-CM | POA: Insufficient documentation

## 2012-09-18 DIAGNOSIS — Z8619 Personal history of other infectious and parasitic diseases: Secondary | ICD-10-CM | POA: Insufficient documentation

## 2012-09-18 DIAGNOSIS — R300 Dysuria: Secondary | ICD-10-CM | POA: Insufficient documentation

## 2012-09-18 DIAGNOSIS — R339 Retention of urine, unspecified: Secondary | ICD-10-CM | POA: Insufficient documentation

## 2012-09-18 DIAGNOSIS — R109 Unspecified abdominal pain: Secondary | ICD-10-CM | POA: Insufficient documentation

## 2012-09-18 LAB — COMPREHENSIVE METABOLIC PANEL
ALT: 35 U/L (ref 0–53)
AST: 37 U/L (ref 0–37)
Albumin: 4 g/dL (ref 3.5–5.2)
Calcium: 9.4 mg/dL (ref 8.4–10.5)
Creatinine, Ser: 1.23 mg/dL (ref 0.50–1.35)
GFR calc non Af Amer: 56 mL/min — ABNORMAL LOW (ref >90)
Sodium: 135 mEq/L (ref 135–145)
Total Protein: 8 g/dL (ref 6.0–8.3)

## 2012-09-18 LAB — CBC WITH DIFFERENTIAL/PLATELET
Basophils Absolute: 0 10*3/uL (ref 0.0–0.1)
Basophils Relative: 0 % (ref 0–1)
Eosinophils Absolute: 0.1 10*3/uL (ref 0.0–0.7)
Eosinophils Relative: 2 % (ref 0–5)
HCT: 45.3 % (ref 39.0–52.0)
Lymphocytes Relative: 37 % (ref 12–46)
MCH: 30.4 pg (ref 26.0–34.0)
MCHC: 34.9 g/dL (ref 30.0–36.0)
MCV: 87.1 fL (ref 78.0–100.0)
Monocytes Absolute: 0.8 10*3/uL (ref 0.1–1.0)
Platelets: 160 10*3/uL (ref 150–400)
RDW: 13.2 % (ref 11.5–15.5)
WBC: 6.2 10*3/uL (ref 4.0–10.5)

## 2012-09-18 MED ORDER — HYDROMORPHONE HCL PF 1 MG/ML IJ SOLN
1.0000 mg | Freq: Once | INTRAMUSCULAR | Status: AC
  Administered 2012-09-18: 1 mg via INTRAVENOUS
  Filled 2012-09-18: qty 1

## 2012-09-18 MED ORDER — SODIUM CHLORIDE 0.9 % IV BOLUS (SEPSIS)
1000.0000 mL | Freq: Once | INTRAVENOUS | Status: AC
  Administered 2012-09-18: 1000 mL via INTRAVENOUS

## 2012-09-18 MED ORDER — ONDANSETRON HCL 4 MG/2ML IJ SOLN
4.0000 mg | Freq: Once | INTRAMUSCULAR | Status: AC
  Administered 2012-09-18: 4 mg via INTRAVENOUS
  Filled 2012-09-18: qty 2

## 2012-09-18 NOTE — ED Notes (Signed)
MD at bedside. 

## 2012-09-18 NOTE — ED Provider Notes (Signed)
History     CSN: [id number]  Arrival date & time 09/18/12  1546   First MD Initiated Contact with Patient 09/18/12 1656      Chief Complaint  Patient presents with  . Groin Pain  . Urinary Retention    (Consider location/radiation/quality/duration/timing/severity/associated sxs/prior treatment) HPI  The patient presents with one day of suprapubic pain.  He also complains of difficult initiating a urinary stream, decreased urination.  There is also dysuria.  The dysuria seems to have been present for several days, but the other complaints 12 within the past 2 hours.  There is no associated fever, chills, vomiting, diarrhea.  No scrotal pain or swelling.  The patient has a history of prostate cancer, status post surgery and radiation therapy.  Past Medical History  Diagnosis Date  . Hypertension   . Prostate cancer 2009    Stage T1c diagnosed by Dr. Marcelyn Bruins; treated with Lupron followed by external beam radiation and brachytherapy.  . OSA (obstructive sleep apnea)     Severe by diagnostic polysomnogram 06/22/2004.  Marland Kitchen Benign neoplasm of bladder     S/P transurethral resection of lesion of right anterior bladder neck on  12/19/2008; S/P transurethral resection of bladder tumor (small) and transurethral resection of bladder neck on 7/13 2010 by Dr. Vic Blackbird  . Dysuria   . Urge incontinence   . Adenomatous colon polyp 12/14/2005    5 mm polyp in descending colon, found on colonoscopy by Dr. Charlott Rakes  . Occult blood in stools   . Hand eczema   . BPH (benign prostatic hypertrophy)   . Insomnia   . Allergic rhinitis   . History of hepatitis B   . Hepatitis C     Hepatitis C RNA Quant on July 06, 2007 showed no detectable virus.  . ED (erectile dysfunction)   . Primary osteoarthritis of both knees     S/P bilateral knee replacements.    Past Surgical History  Procedure Laterality Date  . Transurethral resection of bladder tumor  2010    S/P transurethral  resection of lesion of right anterior bladder neck on  12/19/2008; S/P transurethral resection of bladder tumor (small) and transurethral resection of bladder neck on 7/13 2010 by Dr. Vic Blackbird    Family History  Problem Relation Age of Onset  . Cancer Neg Hx   . Diabetes Neg Hx   . Heart attack Mother 33    History  Substance Use Topics  . Smoking status: Former Smoker -- 0.25 packs/day for 25 years    Quit date: 11/26/1999  . Smokeless tobacco: Never Used  . Alcohol Use: No      Review of Systems  Constitutional:       Per HPI, otherwise negative  HENT:       Per HPI, otherwise negative  Respiratory:       Per HPI, otherwise negative  Cardiovascular:       Per HPI, otherwise negative  Gastrointestinal: Negative for vomiting.  Endocrine:       Negative aside from HPI  Genitourinary: Positive for dysuria, urgency, decreased urine volume and difficulty urinating. Negative for frequency, hematuria, flank pain, discharge, penile swelling, scrotal swelling, enuresis, genital sores, penile pain and testicular pain.  Musculoskeletal:       Per HPI, otherwise negative  Skin: Negative.   Neurological: Negative for syncope.    Allergies  Aspirin  Home Medications   Current Outpatient Rx  Name  Route  Sig  Dispense  Refill  . lisinopril-hydrochlorothiazide (PRINZIDE,ZESTORETIC) 10-12.5 MG per tablet   Oral   Take 1 tablet by mouth every morning.         . traMADol (ULTRAM) 50 MG tablet   Oral   Take 50 mg by mouth every 6 (six) hours as needed for pain.           BP 178/87  Pulse 58  Temp(Src) 98.1 F (36.7 C)  Resp 19  Ht 5' 10.5" (1.791 m)  SpO2 99%  Physical Exam  Nursing note and vitals reviewed. Constitutional: He is oriented to person, place, and time. He appears well-developed. No distress.  HENT:  Head: Normocephalic and atraumatic.  Eyes: Conjunctivae and EOM are normal.  Cardiovascular: Normal rate and regular rhythm.    Pulmonary/Chest: Effort normal. No stridor. No respiratory distress.  Abdominal: He exhibits no distension.  Musculoskeletal: He exhibits no edema.  Neurological: He is alert and oriented to person, place, and time.  Skin: Skin is warm and dry.  Psychiatric: He has a normal mood and affect.    ED Course  Procedures (including critical care time)  Labs Reviewed  CBC WITH DIFFERENTIAL - Abnormal; Notable for the following:    Monocytes Relative 13 (*)    All other components within normal limits  COMPREHENSIVE METABOLIC PANEL - Abnormal; Notable for the following:    Glucose, Bld 117 (*)    GFR calc non Af Amer 56 (*)    GFR calc Af Amer 65 (*)    All other components within normal limits  URINALYSIS, ROUTINE W REFLEX MICROSCOPIC   No results found.  Pulse ox 90% room air normal  No diagnosis found. On multiple repeat evaluation the patient has stable vital signs, though he has recurrent pain.  This improve with analgesics.  I spoke at length with the patient's urology team.  The patient was seen 2 weeks ago by one of them for dysuria, subsequent culture found enterococcus, for which the patient has not yet initiated therapy.  We reviewed the CT findings concurrently.  The patient is stable for discharge per urology, if the catheter can be placed successfully.  I spoke at length with the patient's family about possible causes for the patient's discomfort, including the need for ongoing evaluation, beyond the emergency department on either discharge or admission. MDM  This gentleman with history of prostate cancer now presents with dysuria, urinary urge, suprapubic pain.  On exam the patient is afebrile, in no distress.  Given his history of prior cancer, this was an initial consideration.  The patient's CT scan was notable for demonstration of bilateral hydronephrosis, hydroureter.  After multiple unsuccessful attempts to catheterize the patient, I again spoke with the patient's  urologist, who will assist with disposition, catheter placement.  Gerhard Munch, MD 09/18/12 [phone]

## 2012-09-18 NOTE — ED Notes (Signed)
Patient saw MD last week concerning UTI. Patient finished course of antibiotics 3 days. Pain in the area between anus and lower testicles since 3 hours ago. Pain is a throbbing sensation feels as though he has to urinate but can't. Last time he urinated was 3 hours ago.According to wife this area is slightly swollen.

## 2012-09-18 NOTE — ED Notes (Signed)
Pt is aware of the need for urine sample however is unable to provide one at this time

## 2012-09-18 NOTE — ED Notes (Signed)
Bladder scan measured 70 mls in bladder.  No results with In and Out Cath.

## 2012-09-18 NOTE — ED Notes (Signed)
Pt states that he having severe pain groin area times 2 hours. Pt has PMH prostate cancer and had trouble urinating and painful urination since earlier this week.

## 2012-09-19 LAB — URINE MICROSCOPIC-ADD ON

## 2012-09-19 LAB — URINALYSIS, ROUTINE W REFLEX MICROSCOPIC
Bilirubin Urine: NEGATIVE
Nitrite: NEGATIVE
Specific Gravity, Urine: 1.018 (ref 1.005–1.030)
Urobilinogen, UA: 0.2 mg/dL (ref 0.0–1.0)
pH: 5.5 (ref 5.0–8.0)

## 2012-09-19 LAB — PSA: PSA: 16.82 ng/mL — ABNORMAL HIGH (ref <=4.00)

## 2012-09-19 MED ORDER — HYDROMORPHONE HCL PF 1 MG/ML IJ SOLN
1.0000 mg | Freq: Once | INTRAMUSCULAR | Status: AC
  Administered 2012-09-19: 1 mg via INTRAVENOUS
  Filled 2012-09-19: qty 1

## 2012-09-19 NOTE — ED Notes (Signed)
Pt informed on foley care: emptying, instructed to drain at gravity, and leg bag. ENCOURAGED TO call urology to get permission for leg bag use. Instructions given to pt wife, daughter while at bedside.

## 2012-09-19 NOTE — Consult Note (Signed)
Urology Consult  Referring physician: Gerhard Munch, MD Reason for referral: Inability to Pl., Foley catheter, urinary retention, history of prostate cancer, bilateral hydronephrosis  History of Present Illness: [name] is 77 years of age he was diagnosed with prostate cancer 2009 and had higher risk disease with a mixture of Gleason 6 through 8 adenocarcinoma the prostate. The patient underwent a combination of external beam radiation therapy with seed implantation as a boost and also received some temporary Lupron. Patient's PSA nadired at 0.5. Approximate your goes up to 6.4. It has not been rechecked recently. The patient reports some very long-standing voiding issues with a weak stream. He was in to see Dr. Rodman Pickle stent at [facility] urology approximately 3 weeks ago. He was diagnosed with acute cystitis at that time it finished a course of doxycycline. He presents emergency room earlier today with suprapubic pressure inability to void along with significant urgency. The patient was unable to give a voided specimen. Multiple attempts at catheterization in the ER resulted in no urine. Patient's creatinine was normal at 1.2. The patient had a noncontrast CT which showed a moderately distended bladder with bilateral hydroureteronephrosis. There was a least 1 calcification within the bladder that appeared to be a bladder stone.  Past Medical History  Diagnosis Date  . Hypertension   . Prostate cancer 2009    Stage T1c diagnosed by Dr. Marcelyn Bruins; treated with Lupron followed by external beam radiation and brachytherapy.  . OSA (obstructive sleep apnea)     Severe by diagnostic polysomnogram 06/22/2004.  Marland Kitchen Benign neoplasm of bladder     S/P transurethral resection of lesion of right anterior bladder neck on  12/19/2008; S/P transurethral resection of bladder tumor (small) and transurethral resection of bladder neck on 7/13 2010 by Dr. Vic Blackbird  . Dysuria   . Urge incontinence   .  Adenomatous colon polyp 12/14/2005    5 mm polyp in descending colon, found on colonoscopy by Dr. Charlott Rakes  . Occult blood in stools   . Hand eczema   . BPH (benign prostatic hypertrophy)   . Insomnia   . Allergic rhinitis   . History of hepatitis B   . Hepatitis C     Hepatitis C RNA Quant on July 06, 2007 showed no detectable virus.  . ED (erectile dysfunction)   . Primary osteoarthritis of both knees     S/P bilateral knee replacements.   Past Surgical History  Procedure Laterality Date  . Transurethral resection of bladder tumor  2010    S/P transurethral resection of lesion of right anterior bladder neck on  12/19/2008; S/P transurethral resection of bladder tumor (small) and transurethral resection of bladder neck on 7/13 2010 by Dr. Vic Blackbird    Medications: Prior to Admission:  (Not in a hospital admission)  Allergies:  Allergies  Allergen Reactions  . Aspirin     GI symptoms - nausea    Family History  Problem Relation Age of Onset  . Cancer Neg Hx   . Diabetes Neg Hx   . Heart attack Mother 74    Social History:  reports that he quit smoking about 12 years ago. He has never used smokeless tobacco. He reports that he does not drink alcohol or use illicit drugs.  Positive for suprapubic pressure and discomfort, urinary urgency, dysuria, very weak and dribbling urinary stream, fatigue otherwise negative.  Physical Exam:  Vital signs in last 24 hours: Temp:  [98.1 F (36.7 C)-98.9 F (37.2 C)]  98.9 F (37.2 C) (02/09 2050) Pulse Rate:  [58-100] 76 (02/09 2050) Resp:  [14-20] 20 (02/09 2050) BP: (165-230)/(83-100) 174/84 mmHg (02/09 2050) SpO2:  [96 %-100 %] 100 % (02/09 2050)  Constitutional: Vital signs reviewed. WD WN in NAD Head: Normocephalic and atraumatic   Eyes: PERRL, No scleral icterus.  Neck: Supple No  Gross JVD, mass, thyromegaly, or carotid bruit present.  Cardiovascular: RRR Pulmonary/Chest: Normal effort Abdominal: Soft.  Non-tender, non-distended, bowel sounds are normal, no masses, organomegaly, or guarding present.  Genitourinary: Normal external genitalia, normal penile meatus. Extremities: No cyanosis or edema  Neurological: Grossly non-focal.  Skin: Warm,very dry and intact. No rash, cyanosis   Laboratory Data:  Results for orders placed during the hospital encounter of 09/18/12 (from the past 72 hour(s))  CBC WITH DIFFERENTIAL     Status: Abnormal   Collection Time    09/18/12  5:18 PM      Result Value Range   WBC 6.2  4.0 - 10.5 K/uL   RBC 5.20  4.22 - 5.81 MIL/uL   Hemoglobin 15.8  13.0 - 17.0 g/dL   HCT 40.9  81.1 - 91.4 %   MCV 87.1  78.0 - 100.0 fL   MCH 30.4  26.0 - 34.0 pg   MCHC 34.9  30.0 - 36.0 g/dL   RDW 78.2  95.6 - 21.3 %   Platelets 160  150 - 400 K/uL   Neutrophils Relative 48  43 - 77 %   Neutro Abs 3.0  1.7 - 7.7 K/uL   Lymphocytes Relative 37  12 - 46 %   Lymphs Abs 2.3  0.7 - 4.0 K/uL   Monocytes Relative 13 (*) 3 - 12 %   Monocytes Absolute 0.8  0.1 - 1.0 K/uL   Eosinophils Relative 2  0 - 5 %   Eosinophils Absolute 0.1  0.0 - 0.7 K/uL   Basophils Relative 0  0 - 1 %   Basophils Absolute 0.0  0.0 - 0.1 K/uL  COMPREHENSIVE METABOLIC PANEL     Status: Abnormal   Collection Time    09/18/12  5:18 PM      Result Value Range   Sodium 135  135 - 145 mEq/L   Potassium 3.8  3.5 - 5.1 mEq/L   Chloride 99  96 - 112 mEq/L   CO2 24  19 - 32 mEq/L   Glucose, Bld 117 (*) 70 - 99 mg/dL   BUN 14  6 - 23 mg/dL   Creatinine, Ser 0.86  0.50 - 1.35 mg/dL   Calcium 9.4  8.4 - 57.8 mg/dL   Total Protein 8.0  6.0 - 8.3 g/dL   Albumin 4.0  3.5 - 5.2 g/dL   AST 37  0 - 37 U/L   ALT 35  0 - 53 U/L   Alkaline Phosphatase 42  39 - 117 U/L   Total Bilirubin 0.3  0.3 - 1.2 mg/dL   GFR calc non Af Amer 56 (*) >90 mL/min   GFR calc Af Amer 65 (*) >90 mL/min   Comment:            The eGFR has been calculated     using the CKD EPI equation.     This calculation has not been      validated in all clinical     situations.     eGFR's persistently     <90 mL/min signify     possible Chronic Kidney Disease.   No  results found for this or any previous visit (from the past 240 hour(s)). Creatinine:  Recent Labs  09/18/12 1718  CREATININE 1.23   Baseline Creatinine: 1.2 Impression/Assessment:  Urinary retention with history of adenocarcinoma the prostate. The patient has had a recent episode of cystitis and I suspect he has had some incomplete bladder emptying for quite some time. I suspected the patient probably had urethral stricture disease/bladder neck contracture given his history. Flexible cystoscopy was performed at the bedside. The patient was prepped and draped in usual manner and lidocaine jelly was instilled. The patient's anterior urethra was unremarkable. Prostatic urethra which did show some obstruction but the biggest problem was at the bladder neck where there was a severe contracture. Out estimated at 3-4 [place]. I was able to get a guidewire through that area visually. Over the guidewire I used a fascial dilating balloon to dilate him to 16 atmospheres. The balloon was 35 [place]. This was left inflated for approximately 2 minutes and then removed. Over the guidewire I was unable to place a [address] [place] catheter. I made no attempt to repeat the cystoscopy after the manipulation. We had immediate return of approximate 600 cc of green tinted urine.  Plan:  History of adenocarcinoma the prostate with rising PSA. The patient's last PSA was 6 but it was almost a year ago. I've asked the emergency room to run another PSA on blood that was RE drawn prior to manipulation. Foley cath will be left indwelling for the next 2-3 days. The patient ultimately will require repeat cystoscopy with potentially more aggressive treatment of the bladder neck contracture and treatment of what sounds potentially like a significant bladder calculus based on CT imaging. Urine  should be reached that today for infection and antibiotics restarted if suspicious. The patient will need to be sent home with pain medication. I've instructed them to call [facility] tomorrow morning to get a follow up appointment.  Jadie Allington S 09/19/2012, 12:35 AM

## 2012-09-19 NOTE — ED Notes (Signed)
Pt had incontinent episode. Soaked pads, however unable to collect specimen. Urinal in place.

## 2012-09-19 NOTE — ED Notes (Signed)
Unable to insert foley after 3 attempts. MD aware

## 2012-09-21 ENCOUNTER — Encounter: Payer: Medicare Other | Admitting: Internal Medicine

## 2012-09-24 ENCOUNTER — Emergency Department (HOSPITAL_COMMUNITY)
Admission: EM | Admit: 2012-09-24 | Discharge: 2012-09-24 | Disposition: A | Discharge status: Home / Self Care | Payer: Medicare Other | Source: Home / Self Care | Attending: Family Medicine | Admitting: Family Medicine

## 2012-10-07 ENCOUNTER — Other Ambulatory Visit: Payer: Self-pay | Admitting: Urology

## 2012-10-10 ENCOUNTER — Other Ambulatory Visit: Payer: Self-pay | Admitting: Internal Medicine

## 2012-10-11 ENCOUNTER — Telehealth: Payer: Self-pay | Admitting: *Deleted

## 2012-10-11 NOTE — Telephone Encounter (Signed)
Has persistent cough ? ACE inhibitor? Also needs medication review. Please see dr Meredith Pel

## 2012-10-11 NOTE — Telephone Encounter (Signed)
I did not refill the lisinopril-HCTZ today; patient was seen 1/30 with persisting cough (see note), and I called him today to see how it is doing. He reports a persisting problem with dry cough for around 2 months, and he says it started after he started the ACEI.  Plan is to make an appointment tomorrow in clinic for consideration of changing the ACEI to an ARB.  He has a urologic procedure scheduled on 3/17, and may need F/U of his BP next week if his regimen is changed tomorrow.

## 2012-10-12 ENCOUNTER — Ambulatory Visit (INDEPENDENT_AMBULATORY_CARE_PROVIDER_SITE_OTHER): Payer: Medicare Other | Admitting: Internal Medicine

## 2012-10-12 ENCOUNTER — Encounter: Payer: Self-pay | Admitting: Internal Medicine

## 2012-10-12 DIAGNOSIS — R05 Cough: Secondary | ICD-10-CM

## 2012-10-12 DIAGNOSIS — I10 Essential (primary) hypertension: Secondary | ICD-10-CM

## 2012-10-12 MED ORDER — LOSARTAN POTASSIUM 50 MG PO TABS: 50.0000 mg | ORAL_TABLET | Freq: Every day | ORAL | Status: DC

## 2012-10-12 MED ORDER — HYDROCHLOROTHIAZIDE 25 MG PO TABS: 25.0000 mg | ORAL_TABLET | Freq: Every day | ORAL | Status: DC

## 2012-10-12 NOTE — Telephone Encounter (Signed)
What does this note mean? And where is Dr. Meredith Pel at today?  Dr. Dorise Hiss

## 2012-10-12 NOTE — Patient Instructions (Signed)
STOP taking your current blood pressure medicine which is lisinopril/HCTZ.   START TAKING 2 blood pressure medicines: HCTZ (hydrochlorothiazide) and losartan. Take 1 pill per day of each medicine.   Come back in 6-8 weeks for blood pressure check.  Call our clinic with problems or questions.

## 2012-10-12 NOTE — Progress Notes (Signed)
Subjective:     Patient ID: [name], male   DOB: Dec 28, 1935, 77 y.o.   MRN: [id number]  HPI The patient is a 77 YO man who comes in for a follow up visit regarding a cough which was recently evaluated in this clinic and likely thought to be due to his medication. However, he was trialed on other therapy which was not effective and is still bothersome to the patient. He is having a urologic procedure in 1-2 weeks and would like to get this sorted out before then. He is not having any complaints. No headaches, no blurred vision, no dizziness. No chest pain, SOB, light headedness.   Review of Systems  Constitutional: Negative for fever, chills, diaphoresis, activity change, appetite change and fatigue.  HENT: Negative.   Eyes: Negative.   Respiratory: Positive for cough. Negative for apnea, choking, chest tightness, shortness of breath, wheezing and stridor.   Cardiovascular: Negative for chest pain, palpitations and leg swelling.  Gastrointestinal: Negative.   Genitourinary: Positive for dysuria. Negative for urgency, frequency, hematuria, flank pain, decreased urine volume, discharge, penile swelling, scrotal swelling, enuresis, difficulty urinating, genital sores, penile pain and testicular pain.       Chronic dysuria  Neurological: Negative for dizziness, tremors, seizures, syncope, facial asymmetry, speech difficulty, weakness, light-headedness, numbness and headaches.       Objective:   Physical Exam  Constitutional: He is oriented to person, place, and time. He appears well-developed and well-nourished. No distress.  HENT:  Head: Normocephalic and atraumatic.  Eyes: EOM are normal. Pupils are equal, round, and reactive to light.  Neck: Normal range of motion. Neck supple. No JVD present. No tracheal deviation present. No thyromegaly present.  Cardiovascular: Normal rate and regular rhythm.   No murmur heard. Pulmonary/Chest: Effort normal and breath sounds normal. No  respiratory distress. He has no wheezes. He has no rales. He exhibits no tenderness.  Abdominal: Bowel sounds are normal. He exhibits no distension. There is no tenderness. There is no rebound.  Musculoskeletal: Normal range of motion. He exhibits no edema and no tenderness.  Neurological: He is alert and oriented to person, place, and time. No cranial nerve deficit.  Skin: Skin is warm and dry. He is not diaphoretic.  Psychiatric: He has a normal mood and affect. His behavior is normal.       Assessment/Plan:   1. Cough - Will switch from ACE-I to ARB. If cough persistent will need to be re-evaluated.  2. HTN - As BP is still increased at today's visit will increase HCTZ to 25 mg daily and change lisinopril to losartan 40 mg daily. Will have him come back in 6-8 weeks for BP check and probable lab work.   3. Disposition - Follow up in 6-8 weeks. STOP lisinopril/HCTZ 20/12.5 and START HCTZ 25 mg and START losartan 40 mg daily.

## 2012-10-18 ENCOUNTER — Encounter (HOSPITAL_BASED_OUTPATIENT_CLINIC_OR_DEPARTMENT_OTHER): Payer: Self-pay | Admitting: *Deleted

## 2012-10-19 ENCOUNTER — Encounter (HOSPITAL_BASED_OUTPATIENT_CLINIC_OR_DEPARTMENT_OTHER): Payer: Self-pay | Admitting: *Deleted

## 2012-10-19 NOTE — Progress Notes (Signed)
NPO AFTER MN. ARRIVES AT 0615. NEEDS ISTAT AND EKG.

## 2012-10-23 NOTE — Anesthesia Preprocedure Evaluation (Addendum)
Anesthesia Evaluation  Patient identified by MRN, date of birth, ID band Patient awake    Reviewed: Allergy & Precautions, H&P , NPO status , Patient's Chart, lab work & pertinent test results  Airway Mallampati: III TM Distance: >3 FB Neck ROM: full    Dental  (+) Missing, Loose and Dental Advisory Given Very loose right upper front tooth.  Many missing front teeth too.:   Pulmonary sleep apnea and Continuous Positive Airway Pressure Ventilation ,  Severe OSA breath sounds clear to auscultation  Pulmonary exam normal       Cardiovascular Exercise Tolerance: Good hypertension, Pt. on medications Rhythm:regular Rate:Normal     Neuro/Psych negative neurological ROS  negative psych ROS   GI/Hepatic negative GI ROS, (+) Hepatitis -, C and B  Endo/Other  negative endocrine ROS  Renal/GU negative Renal ROS  negative genitourinary   Musculoskeletal   Abdominal   Peds  Hematology negative hematology ROS (+)   Anesthesia Other Findings   Reproductive/Obstetrics negative OB ROS                          Anesthesia Physical Anesthesia Plan  ASA: III  Anesthesia Plan: General   Post-op Pain Management:    Induction: Intravenous  Airway Management Planned: LMA  Additional Equipment:   Intra-op Plan:   Post-operative Plan:   Informed Consent: I have reviewed the patients History and Physical, chart, labs and discussed the procedure including the risks, benefits and alternatives for the proposed anesthesia with the patient or authorized representative who has indicated his/her understanding and acceptance.   Dental Advisory Given  Plan Discussed with: CRNA and Surgeon  Anesthesia Plan Comments:         Anesthesia Quick Evaluation

## 2012-10-24 ENCOUNTER — Encounter (HOSPITAL_BASED_OUTPATIENT_CLINIC_OR_DEPARTMENT_OTHER)
Admission: RE | Disposition: A | Discharge status: Home / Self Care | Payer: Self-pay | Source: Ambulatory Visit | Attending: Urology

## 2012-10-24 ENCOUNTER — Ambulatory Visit (HOSPITAL_BASED_OUTPATIENT_CLINIC_OR_DEPARTMENT_OTHER)
Admission: RE | Admit: 2012-10-24 | Discharge: 2012-10-24 | Disposition: A | Discharge status: Home / Self Care | Payer: Medicare Other | Source: Ambulatory Visit | Attending: Urology | Admitting: Urology

## 2012-10-24 ENCOUNTER — Encounter (HOSPITAL_BASED_OUTPATIENT_CLINIC_OR_DEPARTMENT_OTHER): Payer: Self-pay | Admitting: *Deleted

## 2012-10-24 ENCOUNTER — Ambulatory Visit (HOSPITAL_BASED_OUTPATIENT_CLINIC_OR_DEPARTMENT_OTHER): Payer: Medicare Other | Admitting: Anesthesiology

## 2012-10-24 ENCOUNTER — Other Ambulatory Visit: Payer: Self-pay

## 2012-10-24 ENCOUNTER — Encounter (HOSPITAL_BASED_OUTPATIENT_CLINIC_OR_DEPARTMENT_OTHER): Payer: Self-pay | Admitting: Anesthesiology

## 2012-10-24 DIAGNOSIS — R39198 Other difficulties with micturition: Secondary | ICD-10-CM

## 2012-10-24 DIAGNOSIS — G4733 Obstructive sleep apnea (adult) (pediatric): Secondary | ICD-10-CM | POA: Insufficient documentation

## 2012-10-24 DIAGNOSIS — N210 Calculus in bladder: Secondary | ICD-10-CM | POA: Insufficient documentation

## 2012-10-24 DIAGNOSIS — N320 Bladder-neck obstruction: Secondary | ICD-10-CM | POA: Insufficient documentation

## 2012-10-24 DIAGNOSIS — I10 Essential (primary) hypertension: Secondary | ICD-10-CM | POA: Insufficient documentation

## 2012-10-24 HISTORY — DX: Bladder-neck obstruction: N32.0

## 2012-10-24 HISTORY — DX: Obstructive sleep apnea (adult) (pediatric): G47.33

## 2012-10-24 HISTORY — DX: Inflammatory liver disease, unspecified: K75.9

## 2012-10-24 HISTORY — PX: CYSTOSCOPY WITH LITHOLAPAXY: SHX1425

## 2012-10-24 HISTORY — DX: Nocturia: R35.1

## 2012-10-24 HISTORY — DX: Dependence on other enabling machines and devices: Z99.89

## 2012-10-24 HISTORY — DX: Calculus in bladder: N21.0

## 2012-10-24 LAB — POCT I-STAT 4, (NA,K, GLUC, HGB,HCT)
HCT: 47 % (ref 39.0–52.0)
Hemoglobin: 16 g/dL (ref 13.0–17.0)

## 2012-10-24 SURGERY — CYSTOSCOPY, WITH BLADDER CALCULUS LITHOLAPAXY
Anesthesia: General | Site: Bladder | Wound class: Clean Contaminated

## 2012-10-24 MED ORDER — PROPOFOL 10 MG/ML IV BOLUS
INTRAVENOUS | Status: DC | PRN
  Administered 2012-10-24: 250 mg via INTRAVENOUS

## 2012-10-24 MED ORDER — ONDANSETRON HCL 4 MG/2ML IJ SOLN
INTRAMUSCULAR | Status: DC | PRN
  Administered 2012-10-24: 4 mg via INTRAVENOUS

## 2012-10-24 MED ORDER — ACETAMINOPHEN 650 MG RE SUPP
650.0000 mg | RECTAL | Status: DC | PRN
  Filled 2012-10-24: qty 1

## 2012-10-24 MED ORDER — ACETAMINOPHEN 325 MG PO TABS
650.0000 mg | ORAL_TABLET | ORAL | Status: DC | PRN
  Filled 2012-10-24: qty 2

## 2012-10-24 MED ORDER — STERILE WATER FOR IRRIGATION IR SOLN
Status: DC | PRN
  Administered 2012-10-24: 6000 mL

## 2012-10-24 MED ORDER — FENTANYL CITRATE 0.05 MG/ML IJ SOLN
25.0000 ug | INTRAMUSCULAR | Status: DC | PRN
  Administered 2012-10-24: 25 ug via INTRAVENOUS
  Administered 2012-10-24: 50 ug via INTRAVENOUS
  Administered 2012-10-24: 25 ug via INTRAVENOUS
  Filled 2012-10-24: qty 1

## 2012-10-24 MED ORDER — SODIUM CHLORIDE 0.9 % IJ SOLN
3.0000 mL | INTRAMUSCULAR | Status: DC | PRN
  Filled 2012-10-24: qty 3

## 2012-10-24 MED ORDER — CEPHALEXIN 500 MG PO CAPS: 500.0000 mg | ORAL_CAPSULE | Freq: Two times a day (BID) | ORAL | Status: DC

## 2012-10-24 MED ORDER — OXYCODONE HCL 5 MG PO TABS
5.0000 mg | ORAL_TABLET | ORAL | Status: DC | PRN
  Filled 2012-10-24: qty 2

## 2012-10-24 MED ORDER — LIDOCAINE HCL (CARDIAC) 20 MG/ML IV SOLN
INTRAVENOUS | Status: DC | PRN
  Administered 2012-10-24: 75 mg via INTRAVENOUS

## 2012-10-24 MED ORDER — BELLADONNA ALKALOIDS-OPIUM 16.2-60 MG RE SUPP
RECTAL | Status: DC | PRN
  Administered 2012-10-24: 1 via RECTAL

## 2012-10-24 MED ORDER — HYDROCODONE-ACETAMINOPHEN 5-325 MG PO TABS: 1.0000 | ORAL_TABLET | ORAL | Status: DC | PRN

## 2012-10-24 MED ORDER — FENTANYL CITRATE 0.05 MG/ML IJ SOLN
INTRAMUSCULAR | Status: DC | PRN
  Administered 2012-10-24 (×2): 25 ug via INTRAVENOUS
  Administered 2012-10-24: 50 ug via INTRAVENOUS
  Administered 2012-10-24 (×2): 25 ug via INTRAVENOUS

## 2012-10-24 MED ORDER — ONDANSETRON HCL 4 MG/2ML IJ SOLN
4.0000 mg | Freq: Four times a day (QID) | INTRAMUSCULAR | Status: DC | PRN
  Filled 2012-10-24: qty 2

## 2012-10-24 MED ORDER — LACTATED RINGERS IV SOLN
INTRAVENOUS | Status: DC
  Filled 2012-10-24: qty 1000

## 2012-10-24 MED ORDER — SODIUM CHLORIDE 0.9 % IJ SOLN
3.0000 mL | Freq: Two times a day (BID) | INTRAMUSCULAR | Status: DC
  Filled 2012-10-24: qty 3

## 2012-10-24 MED ORDER — LACTATED RINGERS IV SOLN
INTRAVENOUS | Status: DC
  Administered 2012-10-24 (×3): via INTRAVENOUS
  Filled 2012-10-24: qty 1000

## 2012-10-24 MED ORDER — CEFAZOLIN SODIUM-DEXTROSE 2-3 GM-% IV SOLR
2.0000 g | INTRAVENOUS | Status: AC
  Administered 2012-10-24: 2 g via INTRAVENOUS
  Filled 2012-10-24: qty 50

## 2012-10-24 MED ORDER — SODIUM CHLORIDE 0.9 % IV SOLN
250.0000 mL | INTRAVENOUS | Status: DC | PRN
  Filled 2012-10-24: qty 250

## 2012-10-24 MED ORDER — CEFAZOLIN SODIUM 1-5 GM-% IV SOLN
1.0000 g | INTRAVENOUS | Status: DC
  Filled 2012-10-24: qty 50

## 2012-10-24 MED ORDER — DEXAMETHASONE SODIUM PHOSPHATE 4 MG/ML IJ SOLN
INTRAMUSCULAR | Status: DC | PRN
  Administered 2012-10-24: 8 mg via INTRAVENOUS

## 2012-10-24 SURGICAL SUPPLY — 42 items
ADAPTER CATH URET PLST 4-6FR (CATHETERS) IMPLANT
ADPR CATH URET STRL DISP 4-6FR (CATHETERS)
BAG DRAIN URO-CYSTO SKYTR STRL (DRAIN) ×2 IMPLANT
BAG DRN ANRFLXCHMBR STRAP LEK (BAG) ×1
BAG DRN UROCATH (DRAIN) ×1
BAG URINE LEG 19OZ MD ST LTX (BAG) ×1 IMPLANT
BASKET LASER NITINOL 1.9FR (BASKET) IMPLANT
BASKET STNLS GEMINI 4WIRE 3FR (BASKET) IMPLANT
BASKET ZERO TIP NITINOL 2.4FR (BASKET) IMPLANT
BSKT STON RTRVL 120 1.9FR (BASKET)
BSKT STON RTRVL GEM 120X11 3FR (BASKET)
BSKT STON RTRVL ZERO TP 2.4FR (BASKET)
CANISTER SUCT LVC 12 LTR MEDI- (MISCELLANEOUS) ×2 IMPLANT
CATH COUDE FOLEY 2W 5CC 20FR (CATHETERS) ×1 IMPLANT
CATH INTERMIT  6FR 70CM (CATHETERS) IMPLANT
CATH URET 5FR 28IN CONE TIP (BALLOONS)
CATH URET 5FR 28IN OPEN ENDED (CATHETERS) IMPLANT
CATH URET 5FR 70CM CONE TIP (BALLOONS) IMPLANT
CLOTH BEACON ORANGE TIMEOUT ST (SAFETY) ×2 IMPLANT
DRAPE CAMERA CLOSED 9X96 (DRAPES) ×2 IMPLANT
ELECT KNIFE 24F ACMI (BLADE) ×1 IMPLANT
ELECTROHYDROLIC PROBE 9FR (MISCELLANEOUS) IMPLANT
GLOVE BIO SURGEON STRL SZ8 (GLOVE) ×2 IMPLANT
GLOVE SKINSENSE NS SZ7.0 (GLOVE) ×1
GLOVE SKINSENSE STRL SZ7.0 (GLOVE) IMPLANT
GOWN STRL REIN XL XLG (GOWN DISPOSABLE) ×2 IMPLANT
GOWN XL W/COTTON TOWEL STD (GOWNS) ×3 IMPLANT
GUIDEWIRE 0.038 PTFE COATED (WIRE) IMPLANT
GUIDEWIRE ANG ZIPWIRE 038X150 (WIRE) IMPLANT
GUIDEWIRE STR DUAL SENSOR (WIRE) IMPLANT
KIT BALLIN UROMAX 15FX10 (LABEL) IMPLANT
KIT BALLN UROMAX 15FX4 (MISCELLANEOUS) IMPLANT
KIT BALLN UROMAX 26 75X4 (MISCELLANEOUS)
LASER FIBER DISP (UROLOGICAL SUPPLIES) ×1 IMPLANT
LASER FIBER DISP 1000U (UROLOGICAL SUPPLIES) IMPLANT
PACK CYSTOSCOPY (CUSTOM PROCEDURE TRAY) ×2 IMPLANT
PROBE LITHO 3.3FR 2137.235 (UROLOGICAL SUPPLIES) IMPLANT
PROBE LITHO 5.0FR 2137.1505 (MISCELLANEOUS) IMPLANT
SET HIGH PRES BAL DIL (LABEL)
SHEATH URET ACCESS 12FR/35CM (UROLOGICAL SUPPLIES) IMPLANT
SHEATH URET ACCESS 12FR/55CM (UROLOGICAL SUPPLIES) IMPLANT
WATER STERILE IRR 500ML POUR (IV SOLUTION) IMPLANT

## 2012-10-24 NOTE — Transfer of Care (Signed)
Immediate Anesthesia Transfer of Care Note  Patient: [name]  Procedure(s) Performed: Procedure(s) (LRB): CYSTOSCOPY WITH LITHOLAPAXY (N/A)  Patient Location: Patient transported to PACU with oxygen via face mask at 4 Liters / Min  Anesthesia Type: General  Level of Consciousness: awake and alert   Airway & Oxygen Therapy: Patient Spontanous Breathing and Patient connected to face mask oxygen  Post-op Assessment: Report given to PACU RN and Post -op Vital signs reviewed and stable  Post vital signs: Reviewed and stable  Dentition: Teeth and oropharynx remain in pre-op condition  Complications: No apparent anesthesia complications

## 2012-10-24 NOTE — H&P (Signed)
Urology History and Physical Exam  CC: Bladder stones, difficulty urinating  HPI: 77 year old male presents for anesthetic cystoscopy and cystolithalopaxy. His history is as follows:  Dr. Logan Bores performed ultrasound and biopsy his prostate on 11/01/2007. That was for an elevating trend to the PSA. In 2005, his PSA was 0.9. By the time of his biopsy, his PSA had risen to 3.2. At the time of his biopsy, his prostatic volume was 25 cc. Biopsies were positive as follows:  Right base, Gleason 3+4 involving 2/2 biopsies, less than 5% of core involved Left base, Gleason 4+3 involving 2/2 biopsies, 70% of core involved Right mid, Gleason 3+3, 1/2 biopsies involved, 30% of core involved Left mid, Gleason 4+4, 1/2 biopsies involved, less than 5% of core involved  He decided to undergo radiotherapy, and completed combination radiotherapy on 04/11/2008. He received Lupron before the procedure due to the high-risk disease. His PSA reached its nadir level in September of 2009, at 0.5. He has had a steadily climbing PSA as follows:  11/2008-0.67 11/2009-1.42 03/2010-1.37 To/2012-1.45 03/26/2011- 2.54  He underwent a repeat ultrasound and biopsy of his prostate on 12/25/2011. Prostatic volume at that time was 11 cc. 10 biopsies were taken, all revealed radiation atypia, one revealed coagulative necrosis. There was no evidence of recurrent cancer.  Additionally, he has been evaluated by Dr. Aldean Ast for microscopic hematuria and bladder lesion. Cystoscopy and bladder biopsies were performed, both on 12/19/2008 and 02/19/2009. The first biopsy revealed benign polypoid urothelial mucosa with mild stromal edema and acute chronic inflammation and the second biopsy revealed inflammatory lesion of bladder neck, benign urothelium without evidence of neoplasia. Right bladder neck biopsy revealed atypia.  He presented to the emergency room in urinary retention on 09/19/2012. Catheter placement was difficult, and urology/Dr.  Isabel Caprice was consulted. The patient was dilated with a nephrostomy balloon, and went home with a catheter. He had a catheter removed at his visit on 09/26/2012. He has been voiding since that time quite well. He was found to have a bladder calculus on his scan at that time. This was confirmed at the time of cystoscopy in the office on 10/05/2012--multiple stones were seen.  PMH: Past Medical History  Diagnosis Date  . Hypertension   . Dysuria   . Urge incontinence   . Adenomatous colon polyp 12/14/2005    5 mm polyp in descending colon, found on colonoscopy by Dr. Charlott Rakes  . Occult blood in stools   . Insomnia   . Allergic rhinitis   . ED (erectile dysfunction)   . History of prostate cancer     S/P EXTERNAL RADIATION, SEED IMPLANTS  2009  . History of bladder cancer     S/P TURBT 2010  . Bladder calculi   . Bladder neck contracture   . OSA on CPAP     Severe by diagnostic polysomnogram 06/22/2004.  Marland Kitchen Hepatitis, unspecified PT STATES HAD HEPATITIS APPROX 1980'S ; UNSURE WHAT TYPE    Hepatitis C RNA Quant on July 06, 2007 showed no detectable virus.  . Hand eczema   . Arthritis   . Nocturia     PSH: Past Surgical History  Procedure Laterality Date  . Cardiovascular stress test  07-14-2011  DR NISHAN    NORMAL NUCLEAR STUDY/ EF 62%  . Transurethral resection of bladder neck  12-19-2008  . Transurethral resection of bladder tumor  02-19-2009    AND TUR BLADDER NECK  . Radioactive prostate seed implants  04-11-2008  . Knee arthroscopy  Bilateral 1970'S    Allergies: Allergies  Allergen Reactions  . Aspirin Other (See Comments)    GI symptoms - nausea    Medications: No prescriptions prior to admission     Social History: History   Social History  . Marital Status: Married    Spouse Name: N/A    Number of Children: N/A  . Years of Education: N/A   Occupational History  . Not on file.   Social History Main Topics  . Smoking status: Former Smoker --  0.25 packs/day for 25 years    Types: Cigarettes    Quit date: 11/25/2004  . Smokeless tobacco: Never Used  . Alcohol Use: No  . Drug Use: No  . Sexually Active: Not on file   Other Topics Concern  . Not on file   Social History Narrative  . No narrative on file    Family History: Family History  Problem Relation Age of Onset  . Cancer Neg Hx   . Diabetes Neg Hx   . Heart attack Mother 84    Review of Systems: Genitourinary, constitutional, skin, eye, otolaryngeal, hematologic/lymphatic, cardiovascular, pulmonary, endocrine, musculoskeletal, gastrointestinal, neurological and psychiatric system(s) were reviewed and pertinent findings if present are noted.  Genitourinary: nocturia, weak urinary stream, erectile dysfunction and initiating urination requires straining, but no dysuria and no hematuria.  Physical Exam: @VITALS2 @ General: No acute distress.  Awake. Head:  Normocephalic.  Atraumatic. ENT:  EOMI.  Mucous membranes moist Neck:  Supple.  No lymphadenopathy. CV:  S1 present. S2 present. Regular rate. Pulmonary: Equal effort bilaterally.  Clear to auscultation bilaterally. Abdomen: Soft.  Non tender to palpation. Skin:  Normal turgor.  No visible rash. Extremity: No gross deformity of bilateral upper extremities.  No gross deformity of    bilateral lower extremities. Neurologic: Alert. Appropriate mood.    Studies:  No results found for this basename: HGB, WBC, PLT,  in the last 72 hours  No results found for this basename: NA, K, CL, CO2, BUN, CREATININE, CALCIUM, MAGNESIUM, GFRNONAA, GFRAA,  in the last 72 hours   No results found for this basename: PT, INR, APTT,  in the last 72 hours   No components found with this basename: ABG,     Assessment:  Bladder neck contracture, bladder calculi  Plan: Anesthetic cystoscopy, TUR-BNC, cystolithalopaxy

## 2012-10-24 NOTE — Interval H&P Note (Signed)
History and Physical Interval Note:  10/24/2012 7:25 AM  [name]  has presented today for surgery, with the diagnosis of Bladder Neck Contracture, Bladder Calculi  The various methods of treatment have been discussed with the patient and family. After consideration of risks, benefits and other options for treatment, the patient has consented to  Procedure(s) with comments: CYSTOSCOPY WITH LITHOLAPAXY (N/A) - TRANSURETHRAL RESECTION OF BLADDER NECK CONTRACTURE WITH COLLINS KNIFE    as a surgical intervention .  The patient's history has been reviewed, patient examined, no change in status, stable for surgery.  I have reviewed the patient's chart and labs.  Questions were answered to the patient's satisfaction.     Chelsea Aus

## 2012-10-24 NOTE — Op Note (Signed)
Preoperative diagnosis: Bladder calculus, bladder neck contracture  Postoperative diagnosis: Same   Procedure: Cystoscopy, cystolitholapaxy of 1 cm bladder stone, TUR bladder neck contracture    Surgeon: Bertram Millard. Hanley Woerner, M.D.   Anesthesia: Gen.   Complications: None  Specimen(s): Stone fragments, to family  Drain(s): 20 French Foley catheter  Indications: 77 year old male status post radiotherapy for adenocarcinoma prostate. He has had bladder biopsies for bladder lesions, and more recently has had a significant bladder neck contracture necessitating urethral dilation. Upon cystoscopy at that time, he was found to have a bladder stone. He presents at this time for TUR bladder neck contracture and cystolitholapaxy. The technique as well as risks and complications have been discussed with the patient. He understands these, and desires to proceed.    Technique and findings: The patient was properly identified in the holding area. He received preoperative IV Ancef. He was taken to the operating room where general anesthesia was administered with the LMA. He was placed in the dorsolithotomy position. Genitalia and perineum were prepped and draped. Proper timeout was then performed. I then passed a 22 French panendoscope through his urethra. There were no urethral lesions or strictures. The bladder neck, there was an obvious bladder neck contracture. I was able to eventually, with pressure, negotiate the beak of the scope through this. The bladder was inspected circumferentially with both the 12 and the 70 lenses. There were no bladder lesions. There was a bilobed stone in the right bladder neck area. There was mild erythema at the bladder neck as well. No papillary lesions were seen. There were no significant diverticuli or trabeculations. With the 12 lens on the scope, I used a 360  laser fiber to apply energy to the stone using the holmium laser. The stone fragmented into multiple smaller  fragments which were then easily urinated using a Toomey syringe, from the bladder. These were eventually given to the patient's family. Following inspection the bladder and seeing no further stones, I removed the cystoscope and eventually advanced the resectoscope sheath using the obturator up to the bladder neck contracture. The [employer] was applied to this, and I incised the bladder neck contracture at the 6:00 position from the mid trigone to the prosthetic urethra. This easily opened up this area. The 27 French sheath easily passed back and forth to the bladder neck contracture at this point. There was no significant bleeding. I then removed the scope and placed a 20 French Foley catheter, inflated the balloon with 10 cc of water, and hooked this to dependent drainage. A B. and O. suppository was placed. The patient tolerated procedure well. He was awakened and taken to the PACU in stable condition.

## 2012-10-24 NOTE — Anesthesia Procedure Notes (Addendum)
Procedure Name: LMA Insertion Date/Time: 10/24/2012 7:31 AM Performed by: Fran Lowes Pre-anesthesia Checklist: Patient identified, Emergency Drugs available, Suction available and Patient being monitored Patient Re-evaluated:Patient Re-evaluated prior to inductionOxygen Delivery Method: Circle System Utilized Preoxygenation: Pre-oxygenation with 100% oxygen Intubation Type: IV induction Ventilation: Mask ventilation without difficulty LMA: LMA inserted LMA Size: 4.0 Number of attempts: 1 Airway Equipment and Method: bite block Placement Confirmation: positive ETCO2 Tube secured with: Tape Dental Injury: Teeth and Oropharynx as per pre-operative assessment  Comments: Front upper loose  tooth intact past LMA insertion. Tooth protected with gauze.

## 2012-10-24 NOTE — Anesthesia Postprocedure Evaluation (Signed)
  Anesthesia Post-op Note  Patient: [name]  Procedure(s) Performed: Procedure(s) (LRB): CYSTOSCOPY WITH LITHOLAPAXY (N/A)  Patient Location: PACU  Anesthesia Type: General  Level of Consciousness: awake and alert   Airway and Oxygen Therapy: Patient Spontanous Breathing  Post-op Pain: mild  Post-op Assessment: Post-op Vital signs reviewed, Patient's Cardiovascular Status Stable, Respiratory Function Stable, Patent Airway and No signs of Nausea or vomiting  Last Vitals:  Filed Vitals:   10/24/12 0623  BP: 167/83  Pulse: 60  Temp: 36.4 C  Resp: 18    Post-op Vital Signs: stable   Complications: No apparent anesthesia complications

## 2012-10-24 NOTE — Addendum Note (Signed)
Addendum created 10/24/12 1610 by Fran Lowes, CRNA   Modules edited: Anesthesia Responsible Staff

## 2012-10-27 ENCOUNTER — Encounter (HOSPITAL_BASED_OUTPATIENT_CLINIC_OR_DEPARTMENT_OTHER): Payer: Self-pay | Admitting: Urology

## 2012-12-13 ENCOUNTER — Other Ambulatory Visit: Payer: Self-pay | Admitting: Internal Medicine

## 2012-12-21 ENCOUNTER — Ambulatory Visit: Payer: Medicare Other | Admitting: Internal Medicine

## 2012-12-28 ENCOUNTER — Other Ambulatory Visit: Payer: Self-pay | Admitting: Urology

## 2012-12-28 DIAGNOSIS — C61 Malignant neoplasm of prostate: Secondary | ICD-10-CM

## 2013-01-12 ENCOUNTER — Encounter (HOSPITAL_COMMUNITY)
Admission: RE | Admit: 2013-01-12 | Discharge: 2013-01-12 | Disposition: A | Discharge status: Home / Self Care | Payer: Medicare Other | Source: Ambulatory Visit | Attending: Urology | Admitting: Urology

## 2013-01-12 DIAGNOSIS — C61 Malignant neoplasm of prostate: Secondary | ICD-10-CM | POA: Insufficient documentation

## 2013-01-12 MED ORDER — TECHNETIUM TC 99M MEDRONATE IV KIT
25.0000 | PACK | Freq: Once | INTRAVENOUS | Status: AC | PRN
  Administered 2013-01-12: 25 via INTRAVENOUS

## 2013-03-01 ENCOUNTER — Other Ambulatory Visit: Payer: Self-pay | Admitting: Internal Medicine

## 2013-03-01 NOTE — Telephone Encounter (Signed)
Please schedule an appointment within the next 2 months.

## 2013-04-12 ENCOUNTER — Encounter: Payer: Self-pay | Admitting: Internal Medicine

## 2013-04-12 ENCOUNTER — Ambulatory Visit (INDEPENDENT_AMBULATORY_CARE_PROVIDER_SITE_OTHER): Payer: Medicare Other | Admitting: Internal Medicine

## 2013-04-12 VITALS — BP 132/76 | HR 76 | Temp 97.2°F | Ht 70.5 in | Wt 231.7 lb

## 2013-04-12 DIAGNOSIS — C61 Malignant neoplasm of prostate: Secondary | ICD-10-CM

## 2013-04-12 DIAGNOSIS — Z23 Encounter for immunization: Secondary | ICD-10-CM

## 2013-04-12 DIAGNOSIS — L259 Unspecified contact dermatitis, unspecified cause: Secondary | ICD-10-CM

## 2013-04-12 DIAGNOSIS — I10 Essential (primary) hypertension: Secondary | ICD-10-CM

## 2013-04-12 LAB — BASIC METABOLIC PANEL WITH GFR
Calcium: 9.3 mg/dL (ref 8.4–10.5)
Creat: 1.24 mg/dL (ref 0.50–1.35)
GFR, Est African American: 66 mL/min
GFR, Est Non African American: 57 mL/min — ABNORMAL LOW

## 2013-04-12 MED ORDER — TRIAMCINOLONE ACETONIDE 0.1 % EX CREA: 1.0000 "application " | TOPICAL_CREAM | CUTANEOUS | Status: DC

## 2013-04-12 MED ORDER — HYDROCHLOROTHIAZIDE 25 MG PO TABS: 25.0000 mg | ORAL_TABLET | Freq: Every day | ORAL | Status: DC

## 2013-04-12 MED ORDER — CLOBETASOL PROPIONATE 0.05 % EX OINT: 1.0000 "application " | TOPICAL_OINTMENT | Freq: Every day | CUTANEOUS | Status: DC

## 2013-04-12 MED ORDER — LOSARTAN POTASSIUM 50 MG PO TABS: 50.0000 mg | ORAL_TABLET | Freq: Every day | ORAL | Status: DC

## 2013-04-12 NOTE — Progress Notes (Signed)
  Subjective:    Patient ID: [name], male    DOB: 03/03/1936, 77 y.o.   MRN: [id number]  HPI Patient returns for followup of his hypertension, hand eczema, and other medical problems.  He is doing well today, without any acute complaints.  He underwent cystoscopy with a litholapaxy and transurethral resection of a bladder neck contracture by Dr. Retta Diones in March of this year, and reports that he has done well since then; he reports some urinary urgency with leakage since the procedure, but otherwise has had no problems.  He has follow-up scheduled with Dr. Retta Diones this month.  He reports that he has been compliant with his medications.  His previously documented cough resolved completely after he was switched from an ACE inhibitor to an ARB medication for his hypertension.  His hand eczema has been reasonably well-controlled with use of his topical steroids as needed; he requested a refill today.   Review of Systems  Constitutional: Negative for fever, chills and diaphoresis.  HENT: Positive for hearing loss (Chronic on right, attributes to noise exposure at work).   Eyes: Negative for visual disturbance.  Respiratory: Negative for cough and shortness of breath.   Cardiovascular: Negative for chest pain and leg swelling.  Gastrointestinal: Negative for nausea, vomiting, abdominal pain, blood in stool and anal bleeding.  Genitourinary: Negative for dysuria.  Musculoskeletal: Negative for arthralgias.       Objective:   Physical Exam  Constitutional: No distress.  HENT:  Right Ear: External ear and ear canal normal. Tympanic membrane is scarred. Tympanic membrane is not perforated, not erythematous and not bulging. No middle ear effusion.  Left Ear: External ear and ear canal normal. Tympanic membrane is not perforated, not erythematous and not bulging.  No middle ear effusion.  Cardiovascular: Normal rate, regular rhythm and normal heart sounds.  Exam reveals no gallop and no  friction rub.   No murmur heard. Pulmonary/Chest: Effort normal and breath sounds normal. He has no wheezes. He has no rales.  Abdominal: Soft. Bowel sounds are normal. He exhibits no distension. There is no hepatosplenomegaly. There is no tenderness. There is no rebound and no guarding.  Musculoskeletal: He exhibits no edema.       Assessment & Plan:

## 2013-04-12 NOTE — Assessment & Plan Note (Signed)
Assessment: Patient's hand eczema is reasonably well-controlled on his current topical treatment with Temovate ointment for bedtime application and Kenalog cream for morning application, which he uses only when needed.  Plan: Continue current treatment regimen.

## 2013-04-12 NOTE — Assessment & Plan Note (Signed)
BP Readings from Last 3 Encounters:  04/12/13 132/76  10/24/12 136/73  10/24/12 136/73    Lab Results  Component Value Date   NA 143 10/24/2012   K 4.1 10/24/2012   CREATININE 1.23 09/18/2012    Assessment: Blood pressure control: controlled Progress toward BP goal:  at goal Comments: Blood pressure is well controlled on losartan 50 mg daily and hydrochlorothiazide 25 mg daily.     Plan: Medications:  continue current medications Educational resources provided: brochure Self management tools provided: home blood pressure logbook Other plans: check basic metabolic panel today

## 2013-04-12 NOTE — Patient Instructions (Signed)
General Instructions: Continue current medications. Please keep your appointment with Dr. Retta Diones next week.   Progress Toward Treatment Goals:  Treatment Goal 04/12/2013  Blood pressure at goal    Self Care Goals & Plans:  Self Care Goal 04/12/2013  Manage my medications take my medicines as prescribed; refill my medications on time  Monitor my health keep track of my blood pressure  Eat healthy foods drink diet soda or water instead of juice or soda; eat foods that are low in salt  Be physically active take a walk every day  Meeting treatment goals maintain the current self-care plan       Care Management & Community Referrals:  Referral 04/12/2013  Referrals made for care management support none needed  Referrals made to community resources none

## 2013-04-12 NOTE — Assessment & Plan Note (Signed)
PSA  Date Value Range Status  09/18/2012 16.82* <=4.00 ng/mL Final  10/07/2011 5.76* <=4.00 ng/mL Final  05/15/2011 3.53  <=4.00 ng/mL Final  11/26/2010 2.03  <=4.00 ng/mL Final     Assessment: Patient reports that he is followed by Dr. Retta Diones, and has follow up scheduled this month.  Plan: I advised patient to follow up with Dr. Retta Diones as scheduled; I did not draw a PSA today since he will have that done when he sees Dr. Retta Diones.

## 2013-04-15 ENCOUNTER — Other Ambulatory Visit: Payer: Self-pay | Admitting: Internal Medicine

## 2013-09-20 ENCOUNTER — Ambulatory Visit (INDEPENDENT_AMBULATORY_CARE_PROVIDER_SITE_OTHER): Payer: Medicare HMO | Admitting: Internal Medicine

## 2013-09-20 ENCOUNTER — Encounter: Payer: Self-pay | Admitting: Internal Medicine

## 2013-09-20 VITALS — BP 163/87 | HR 51 | Temp 97.3°F | Wt 228.8 lb

## 2013-09-20 DIAGNOSIS — G4733 Obstructive sleep apnea (adult) (pediatric): Secondary | ICD-10-CM

## 2013-09-20 DIAGNOSIS — C61 Malignant neoplasm of prostate: Secondary | ICD-10-CM

## 2013-09-20 DIAGNOSIS — I10 Essential (primary) hypertension: Secondary | ICD-10-CM

## 2013-09-20 DIAGNOSIS — Z8546 Personal history of malignant neoplasm of prostate: Secondary | ICD-10-CM

## 2013-09-20 DIAGNOSIS — D126 Benign neoplasm of colon, unspecified: Secondary | ICD-10-CM

## 2013-09-20 DIAGNOSIS — Z86010 Personal history of colonic polyps: Secondary | ICD-10-CM

## 2013-09-20 LAB — CBC WITH DIFFERENTIAL/PLATELET
BASOS PCT: 0 % (ref 0–1)
Basophils Absolute: 0 10*3/uL (ref 0.0–0.1)
Eosinophils Absolute: 0.1 10*3/uL (ref 0.0–0.7)
Eosinophils Relative: 2 % (ref 0–5)
HEMATOCRIT: 47.9 % (ref 39.0–52.0)
HEMOGLOBIN: 16.1 g/dL (ref 13.0–17.0)
LYMPHS ABS: 2.4 10*3/uL (ref 0.7–4.0)
Lymphocytes Relative: 50 % — ABNORMAL HIGH (ref 12–46)
MCH: 31.1 pg (ref 26.0–34.0)
MCHC: 33.6 g/dL (ref 30.0–36.0)
MCV: 92.5 fL (ref 78.0–100.0)
MONO ABS: 0.4 10*3/uL (ref 0.1–1.0)
MONOS PCT: 9 % (ref 3–12)
NEUTROS ABS: 1.9 10*3/uL (ref 1.7–7.7)
NEUTROS PCT: 39 % — AB (ref 43–77)
Platelets: 197 10*3/uL (ref 150–400)
RBC: 5.18 MIL/uL (ref 4.22–5.81)
RDW: 13.9 % (ref 11.5–15.5)
WBC: 4.9 10*3/uL (ref 4.0–10.5)

## 2013-09-20 LAB — COMPLETE METABOLIC PANEL WITH GFR
ALBUMIN: 3.9 g/dL (ref 3.5–5.2)
ALT: 53 U/L (ref 0–53)
AST: 40 U/L — ABNORMAL HIGH (ref 0–37)
Alkaline Phosphatase: 44 U/L (ref 39–117)
BUN: 16 mg/dL (ref 6–23)
CALCIUM: 9.4 mg/dL (ref 8.4–10.5)
CHLORIDE: 104 meq/L (ref 96–112)
CO2: 25 meq/L (ref 19–32)
CREATININE: 1.23 mg/dL (ref 0.50–1.35)
GFR, EST AFRICAN AMERICAN: 66 mL/min
GFR, Est Non African American: 57 mL/min — ABNORMAL LOW
Glucose, Bld: 87 mg/dL (ref 70–99)
POTASSIUM: 3.9 meq/L (ref 3.5–5.3)
Sodium: 138 mEq/L (ref 135–145)
Total Bilirubin: 0.5 mg/dL (ref 0.2–1.2)
Total Protein: 6.9 g/dL (ref 6.0–8.3)

## 2013-09-20 LAB — LIPID PANEL
CHOLESTEROL: 151 mg/dL (ref 0–200)
HDL: 38 mg/dL — ABNORMAL LOW (ref >39)
LDL CALC: 90 mg/dL (ref 0–99)
TRIGLYCERIDES: 113 mg/dL (ref <150)
Total CHOL/HDL Ratio: 4 Ratio
VLDL: 23 mg/dL (ref 0–40)

## 2013-09-20 MED ORDER — LOSARTAN POTASSIUM 25 MG PO TABS: 75.0000 mg | ORAL_TABLET | Freq: Every day | ORAL | Status: DC

## 2013-09-20 NOTE — Patient Instructions (Signed)
General Instructions: Change losartan to a 25 mg tablet and take 3 tablets once daily. Continue hydrochlorothiazide (HCTZ) 25 mg once daily. A referral has been made to Dr. Michail Sermon for screening colonoscopy; this is important given your history of an adenomatous colon polyp. Please followup with Dr. Diona Fanti as scheduled.   Progress Toward Treatment Goals:  Treatment Goal 09/20/2013  Blood pressure deteriorated    Self Care Goals & Plans:  Self Care Goal 09/20/2013  Manage my medications take my medicines as prescribed; refill my medications on time  Monitor my health bring my blood pressure log to each visit  Eat healthy foods eat foods that are low in salt; eat baked foods instead of fried foods; drink diet soda or water instead of juice or soda  Be physically active find an activity I enjoy  Meeting treatment goals -      Care Management & Community Referrals:  Referral 09/20/2013  Referrals made for care management support none needed  Referrals made to community resources none

## 2013-09-20 NOTE — Assessment & Plan Note (Signed)
Assessment: As before, I advised patient to followup with Dr. Michail Sermon for surveillance colonoscopy, and I again explained to him the importance of doing so given his history of adenomatous colonic polyp and the risk of developing colon cancer.  Plan: Refer to Dr. Michail Sermon for colonoscopy.

## 2013-09-20 NOTE — Assessment & Plan Note (Signed)
PSA  Date Value Ref Range Status  04/19/2013 28.31   Final     Abstract entry from Dr. Alan Ripper record  09/18/2012 16.82* <=4.00 ng/mL Final     Test Methodology: ECLIA PSA (Electrochemiluminescence Immunoassay)  10/07/2011 5.76* <=4.00 ng/mL Final     Test Methodology: ECLIA PSA (Electrochemiluminescence Immunoassay)  05/15/2011 3.53  <=4.00 ng/mL Final     Test Methodology: ECLIA PSA (Electrochemiluminescence Immunoassay)     Assessment: Patient was last seen on 05/29/2013 by Dr. Diona Fanti, who noted that patient's elevated PSA was evidence of recurrent prostate cancer and he discussed this with patient.  At that time, Dr. Diona Fanti did not think patient needed to start adjuvant treatment.  Patient is scheduled to see Dr. Diona Fanti later this month.  He will have blood drawn tomorrow for Dr. Diona Fanti, so I will not obtain a PSA today since it will be drawn tomorrow.    Plan: I advised patient to follow up with Dr. Diona Fanti as scheduled.

## 2013-09-20 NOTE — Assessment & Plan Note (Signed)
Assessment: Patient says he is not using his CPAP regularly.  He does have a machine at home.  Plan: He will have the providing agency service the machine and he will try using it regularly.

## 2013-09-20 NOTE — Progress Notes (Signed)
   Subjective:    Patient ID: [name], male    DOB: [birth year], 78 y.o.   MRN: [id number]  HPI Patient returns for followup of his hypertension, prostate cancer, obstructive sleep apnea, and other chronic medical problems.  He reports that he is doing well, with no acute complaints.  He reports that he is compliant with his medications.  His prostate cancer is followed by his urologist Dr. Diona Fanti, and he is scheduled to have blood work drawn tomorrow and has an appointment with Dr. Diona Fanti at on 09/27/2013.  He still has some urinary urgency, but says that he goes more frequently during the day than at night.  He has not been wearing his CPAP regularly by his report.   Review of Systems  Constitutional: Negative for fever and chills.  Respiratory: Positive for cough (Occasional). Negative for shortness of breath.   Cardiovascular: Negative for chest pain and leg swelling.  Gastrointestinal: Negative for nausea, vomiting, abdominal pain, blood in stool and anal bleeding.  Genitourinary: Positive for urgency (Occasional). Negative for dysuria.       Objective:   Physical Exam  Constitutional: No distress.  Cardiovascular: Regular rhythm and normal heart sounds.  Exam reveals no gallop.   No murmur heard. Pulmonary/Chest: Effort normal and breath sounds normal. No respiratory distress. He has no wheezes. He has no rales.  Abdominal: Soft. Bowel sounds are normal. He exhibits no distension. There is no tenderness. There is no rebound and no guarding.  Musculoskeletal: He exhibits no edema.        Assessment & Plan:

## 2013-09-20 NOTE — Assessment & Plan Note (Signed)
BP Readings from Last 3 Encounters:  09/20/13 163/87  04/12/13 132/76  10/24/12 136/73    Lab Results  Component Value Date   NA 139 04/12/2013   K 3.9 04/12/2013   CREATININE 1.24 04/12/2013    Assessment: Blood pressure control: moderately elevated Progress toward BP goal:  deteriorated Comments: Blood pressure is moderately elevated on hydrochlorothiazide 25 mg daily and losartan 50 mg daily.  Plan: Medications:  Increase losartan to a dose of 75 mg daily; continue hydrochlorothiazide 25 mg daily Educational resources provided: brochure Self management tools provided: home blood pressure logbook Other plans: Check labs today including a comprehensive metabolic panel, lipid panel, and CBC

## 2013-09-28 ENCOUNTER — Other Ambulatory Visit: Payer: Self-pay | Admitting: Urology

## 2013-09-28 DIAGNOSIS — C61 Malignant neoplasm of prostate: Secondary | ICD-10-CM

## 2013-10-09 ENCOUNTER — Encounter (HOSPITAL_COMMUNITY)
Admission: RE | Admit: 2013-10-09 | Discharge: 2013-10-09 | Disposition: A | Discharge status: Home / Self Care | Payer: Medicare HMO | Source: Ambulatory Visit | Attending: Urology | Admitting: Urology

## 2013-10-09 DIAGNOSIS — C7951 Secondary malignant neoplasm of bone: Secondary | ICD-10-CM | POA: Insufficient documentation

## 2013-10-09 DIAGNOSIS — C61 Malignant neoplasm of prostate: Secondary | ICD-10-CM | POA: Insufficient documentation

## 2013-10-09 DIAGNOSIS — C7952 Secondary malignant neoplasm of bone marrow: Secondary | ICD-10-CM

## 2013-10-09 DIAGNOSIS — R599 Enlarged lymph nodes, unspecified: Secondary | ICD-10-CM | POA: Insufficient documentation

## 2013-10-09 DIAGNOSIS — R911 Solitary pulmonary nodule: Secondary | ICD-10-CM | POA: Insufficient documentation

## 2013-10-09 MED ORDER — FLUDEOXYGLUCOSE F - 18 (FDG) INJECTION
11.0000 | Freq: Once | INTRAVENOUS | Status: AC | PRN
  Administered 2013-10-09: 11 via INTRAVENOUS

## 2014-01-04 ENCOUNTER — Other Ambulatory Visit: Payer: Self-pay | Admitting: Internal Medicine

## 2014-01-04 DIAGNOSIS — C61 Malignant neoplasm of prostate: Secondary | ICD-10-CM

## 2014-01-08 ENCOUNTER — Other Ambulatory Visit: Payer: Self-pay | Admitting: Internal Medicine

## 2014-01-16 ENCOUNTER — Other Ambulatory Visit: Payer: Self-pay | Admitting: Internal Medicine

## 2014-04-12 ENCOUNTER — Encounter: Payer: Self-pay | Admitting: Internal Medicine

## 2014-04-12 ENCOUNTER — Ambulatory Visit (INDEPENDENT_AMBULATORY_CARE_PROVIDER_SITE_OTHER): Payer: Medicare HMO | Admitting: Internal Medicine

## 2014-04-12 VITALS — BP 131/70 | HR 70 | Temp 98.4°F | Ht 70.5 in | Wt 234.3 lb

## 2014-04-12 DIAGNOSIS — J398 Other specified diseases of upper respiratory tract: Secondary | ICD-10-CM

## 2014-04-12 DIAGNOSIS — I10 Essential (primary) hypertension: Secondary | ICD-10-CM

## 2014-04-12 DIAGNOSIS — Z23 Encounter for immunization: Secondary | ICD-10-CM

## 2014-04-12 DIAGNOSIS — J399 Disease of upper respiratory tract, unspecified: Secondary | ICD-10-CM

## 2014-04-12 DIAGNOSIS — L259 Unspecified contact dermatitis, unspecified cause: Secondary | ICD-10-CM

## 2014-04-12 DIAGNOSIS — R229 Localized swelling, mass and lump, unspecified: Secondary | ICD-10-CM

## 2014-04-12 MED ORDER — SALINE SPRAY 0.65 % NA SOLN: 2.0000 | Freq: Four times a day (QID) | NASAL | Status: DC

## 2014-04-12 MED ORDER — CLOBETASOL PROPIONATE 0.05 % EX OINT: 1.0000 "application " | TOPICAL_OINTMENT | Freq: Every day | CUTANEOUS | Status: DC

## 2014-04-12 MED ORDER — FEXOFENADINE HCL 180 MG PO TABS: 180.0000 mg | ORAL_TABLET | Freq: Every day | ORAL | Status: DC

## 2014-04-12 NOTE — Patient Instructions (Signed)
General Instructions: -Start using saline nasal spray 4 times per day to keep your nasal passages moist.  -Start taking Allegra 180mg  once tablet per day for your allergies.  -Try to avoid dust or allergens that may trigger worsening of your allergies.  -You may use lozenges such as Ricola to sooth the dry cough.  -You have been referred to Dermatology for further evaluation of the skin nodules and of psoriasis.  -Return to the clinic if you start having fever/chills, or increased green/thick nasal discharge.  -Follow up with Dr. Marinda Elk in 3 months.   Thank you for bringing your medicines today. This helps [place] keep you safe from mistakes.  Treatment Goals:  Goals (1 Years of Data) as of 04/12/14         As of Today 09/20/13 09/20/13 09/20/13 04/12/13     Blood Pressure    . Blood Pressure < 140/90  131/70 163/87 151/86 151/81 132/76      Progress Toward Treatment Goals:  Treatment Goal 04/12/2014  Blood pressure at goal    Self Care Goals & Plans:  Self Care Goal 04/12/2014  Manage my medications take my medicines as prescribed; bring my medications to every visit; refill my medications on time  Monitor my health -  Eat healthy foods drink diet soda or water instead of juice or soda; eat more vegetables; eat foods that are low in salt; eat baked foods instead of fried foods; eat fruit for snacks and desserts  Be physically active -  Meeting treatment goals -    No flowsheet data found.   Care Management & Community Referrals:  Referral 04/12/2014  Referrals made for care management support none needed  Referrals made to community resources -      Allergic Rhinitis Allergic rhinitis is when the mucous membranes in the nose respond to allergens. Allergens are particles in the air that cause your body to have an allergic reaction. This causes you to release allergic antibodies. Through a chain of events, these eventually cause you to release histamine into the blood stream. Although  meant to protect the body, it is this release of histamine that causes your discomfort, such as frequent sneezing, congestion, and an itchy, runny nose.  CAUSES  Seasonal allergic rhinitis (hay fever) is caused by pollen allergens that may come from grasses, trees, and weeds. Year-round allergic rhinitis (perennial allergic rhinitis) is caused by allergens such as house dust mites, pet dander, and mold spores.  SYMPTOMS   Nasal stuffiness (congestion).  Itchy, runny nose with sneezing and tearing of the eyes. DIAGNOSIS  Your health care provider can help you determine the allergen or allergens that trigger your symptoms. If you and your health care provider are unable to determine the allergen, skin or blood testing may be used. TREATMENT  Allergic rhinitis does not have a cure, but it can be controlled by:  Medicines and allergy shots (immunotherapy).  Avoiding the allergen. Hay fever may often be treated with antihistamines in pill or nasal spray forms. Antihistamines block the effects of histamine. There are over-the-counter medicines that may help with nasal congestion and swelling around the eyes. Check with your health care provider before taking or giving this medicine.  If avoiding the allergen or the medicine prescribed do not work, there are many new medicines your health care provider can prescribe. Stronger medicine may be used if initial measures are ineffective. Desensitizing injections can be used if medicine and avoidance does not work. Desensitization is when a  patient is given ongoing shots until the body becomes less sensitive to the allergen. Make sure you follow up with your health care provider if problems continue. HOME CARE INSTRUCTIONS It is not possible to completely avoid allergens, but you can reduce your symptoms by taking steps to limit your exposure to them. It helps to know exactly what you are allergic to so that you can avoid your specific triggers. SEEK MEDICAL  CARE IF:   You have a fever.  You develop a cough that does not stop easily (persistent).  You have shortness of breath.  You start wheezing.  Symptoms interfere with normal daily activities. Document Released: 04/21/2001 Document Revised: 08/01/2013 Document Reviewed: 04/03/2013 [facility] Patient Information 2015 [place], [place]. This information is not intended to replace advice given to you by your health care provider. Make sure you discuss any questions you have with your health care provider.

## 2014-04-13 DIAGNOSIS — R062 Wheezing: Secondary | ICD-10-CM | POA: Insufficient documentation

## 2014-04-13 DIAGNOSIS — R229 Localized swelling, mass and lump, unspecified: Secondary | ICD-10-CM | POA: Insufficient documentation

## 2014-04-13 NOTE — Assessment & Plan Note (Addendum)
Refilled clobetasol cream for his hand tx but referred pt to Dermatology for treatment of other areas involved.

## 2014-04-13 NOTE — Progress Notes (Signed)
Subjective:    Patient ID: [name], male    DOB: 1936/04/03, 78 y.o.   MRN: [id number]  Psoriasis  Cough Associated symptoms include postnasal drip and rhinorrhea. Pertinent negatives include no chest pain, chills, ear pain, fever, headaches, rash, sore throat, shortness of breath or wheezing.   [name] is a pleasant 78 year man with PMH of HTN, prostate cancer, OSA, who presents for evaluation of a cough, skin nodules on his left buttocks, and medication refill. He states that he has been cleaning out a relative's house and has been exposed to a dust for the past 2-3 weeks. Since then he has developed nasal congestion with pressure in his right nose/forehead, post-nasal drip, sneezing, watery eyes, and a cough productive of scant clear/yellow sputum. He denies contact with person with URI symptoms. He also denies fever/chills, N/V/D, ear pain, or decreased appetite. He took Benadryl once at night and noted that he slept better but continues to have nasal congestion and cough.  He has also noted "bumps" on his left buttocks that are non pruritic and non tender and have been present for weeks to a month. He has been applying an OTC cream to the bumps but they have not changed in size.  Finally, he complains of persistent psoriasis on his right palm, bilateral side burns and inside his ears. He used clobetasol cream on hs hands last year with improvement of his skin and wants a refill for this medication.     Review of Systems  Constitutional: Negative for fever, chills, appetite change, fatigue and unexpected weight change.  HENT: Positive for congestion, postnasal drip, rhinorrhea and sneezing. Negative for dental problem, ear pain, hearing loss, nosebleeds, sinus pressure, sore throat, trouble swallowing and voice change.   Eyes: Positive for itching. Negative for pain and discharge.  Respiratory: Positive for cough. Negative for shortness of breath and wheezing.   Cardiovascular:  Negative for chest pain, palpitations and leg swelling.  Gastrointestinal: Negative for nausea, vomiting, abdominal pain and diarrhea.  Endocrine: Negative for polydipsia.  Genitourinary: Negative for dysuria and frequency.  Skin: Negative for color change, pallor, rash and wound.       Skin nodules on left buttocks  Neurological: Negative for dizziness, weakness, light-headedness and headaches.  Psychiatric/Behavioral: Negative for agitation.       Objective:   Physical Exam  Nursing note and vitals reviewed. Constitutional: He is oriented to person, place, and time. He appears well-developed and well-nourished. No distress.  HENT:  Head: Normocephalic.  Mouth/Throat: Oropharynx is clear and moist. No oropharyngeal exudate.  Psoriasis changes bilaterally in exterior ears but no edema/erythema.  Bilateral pale and boggy nasal turbinates with no discharge. No polyps or septum deviation noted.  No frontal/maxiallry sinus tenderness   Eyes: Conjunctivae are normal. Right eye exhibits no discharge. Left eye exhibits no discharge. No scleral icterus.  Neck: Neck supple.  Cardiovascular: Normal rate and regular rhythm.   Pulmonary/Chest: Effort normal and breath sounds normal. No respiratory distress. He has no wheezes. He has no rales.  Abdominal: Soft. There is no tenderness.  Musculoskeletal: He exhibits no edema and no tenderness.  Lymphadenopathy:    He has no cervical adenopathy.  Neurological: He is alert and oriented to person, place, and time.  Skin: Skin is warm and dry. He is not diaphoretic.  2 skin nodules present on the left buttocks. They are ~1cm in diameter, firm, non tender, with no surrounding erythema, no discharge.  1 skin present on  the left hip ~1cm in diameter, firm, non TTP, with no fluctuance, with no surrounding edema, no discharge but with skin abrasion with no active bleeding(pt reports that his underwear might have scrapped the area)  Psychiatric: He has a  normal mood and affect. His behavior is normal.          Assessment & Plan:

## 2014-04-13 NOTE — Assessment & Plan Note (Signed)
He has skin nodules in his left hip/buttocks with no fluctuance, no TTP, does not appear to be infectious process. Given his hx of prostate cancer with metastatic disease, neoplasm is possible though not as likely.  -Referred to Dermatology for further evaluation and for possible biopsy

## 2014-04-13 NOTE — Assessment & Plan Note (Signed)
He has upper respiratory disease likely caused by exposure to dust while cleaning out a relative's house (described as a hoarder's house). He has history of seasonal allergies but is no on chronic antihistamine tx.  -Rx saline nasal spray 4-5 times daily for post-nasal drip and congestion -Rx Allegra 180mg  daily, non-drowsy anti-histamine -Pt advised to use OTC lozenges to sooth cough and to stay hydrated.  -Pt counseled to return to clinic if he has fever/chills, purulent nasal discharge, increased sputum.

## 2014-04-13 NOTE — Assessment & Plan Note (Signed)
BP Readings from Last 3 Encounters:  04/12/14 131/70  09/20/13 163/87  04/12/13 132/76    Lab Results  Component Value Date   NA 138 09/20/2013   K 3.9 09/20/2013   CREATININE 1.23 09/20/2013    Assessment: Blood pressure control: controlled Progress toward BP goal:  at goal Comments: He is on Cozaar 75mg  daily and HCTZ 25mg  daily Plan: Medications:  continue current medications Educational resources provided: brochure Self management tools provided:   Other plans: follow up in 3 months with his PCP

## 2014-04-14 NOTE — Progress Notes (Signed)
Case discussed with Dr. Kennerly soon after the resident saw the patient.  We reviewed the resident's history and exam and pertinent patient test results.  I agree with the assessment, diagnosis, and plan of care documented in the resident's note. 

## 2014-05-14 ENCOUNTER — Ambulatory Visit (INDEPENDENT_AMBULATORY_CARE_PROVIDER_SITE_OTHER): Payer: Commercial Managed Care - HMO | Admitting: Internal Medicine

## 2014-05-14 ENCOUNTER — Encounter: Payer: Self-pay | Admitting: Internal Medicine

## 2014-05-14 VITALS — BP 166/77 | HR 71 | Temp 97.9°F | Ht 70.0 in | Wt 229.8 lb

## 2014-05-14 DIAGNOSIS — R053 Chronic cough: Secondary | ICD-10-CM

## 2014-05-14 DIAGNOSIS — R05 Cough: Secondary | ICD-10-CM

## 2014-05-14 MED ORDER — HYDROCODONE-HOMATROPINE 5-1.5 MG/5ML PO SYRP: 5.0000 mL | ORAL_SOLUTION | Freq: Four times a day (QID) | ORAL | Status: DC | PRN

## 2014-05-14 NOTE — Assessment & Plan Note (Addendum)
Persistent dry cough. Last seen by Dr. Hayes Ludwig 1 mo ago for similar symptoms. Possible that he has a URI at that time and cough is sequelae. Pt has not been taking the Allegra 180mg  daily as prescribed by Dr. Hayes Ludwig. Afebrile, lungs clear on exam.  Recommended restarting the Allegra, esp in the setting of the "itchy" feeling in his chest with his cough. Reccommended continuing saline nasal spray as well. Starting cough medicine.  - Continue Allegra 180mg  daily - Continue saline nasal spray 2 sprays each nostril BID - Start Hycodan cough syrup, 55mL q6h PRN cough.  - If the cough persists, consider stopping his ARB, which could be contributing/causing his cough

## 2014-05-14 NOTE — Patient Instructions (Signed)
**  You can take the hycodan cough syrup, 5 mL every 6 hours as needed for your cough. If you begin to develop fevers, or if the cough worsens, please call the clinic.   General Instructions:   Please bring your medicines with you each time you come to clinic.  Medicines may include prescription medications, over-the-counter medications, herbal remedies, eye drops, vitamins, or other pills.   Progress Toward Treatment Goals:  Treatment Goal 04/12/2014  Blood pressure at goal    Self Care Goals & Plans:  Self Care Goal 04/12/2014  Manage my medications take my medicines as prescribed; bring my medications to every visit; refill my medications on time  Monitor my health -  Eat healthy foods drink diet soda or water instead of juice or soda; eat more vegetables; eat foods that are low in salt; eat baked foods instead of fried foods; eat fruit for snacks and desserts  Be physically active -  Meeting treatment goals -    No flowsheet data found.   Care Management & Community Referrals:  Referral 04/12/2014  Referrals made for care management support none needed  Referrals made to community resources -

## 2014-05-14 NOTE — Progress Notes (Addendum)
Patient ID: [name], male   DOB: July 12, 1936, 78 y.o.   MRN: [id number]  Subjective:   Patient ID: [name] male   DOB: 1935/10/17 78 y.o.   MRN: [id number]  HPI: Mr.[name] is a 78 y.o. M w/ PMH HTN, prostate cancer, OSA, who presents for evaluation of a cough.   He states that he has a dry cough with "itching in my chest." His wife said that she had similar symptoms recently. The patient was seen on 04/13/14 with a cough which was thought 2/2 allergies after cleaning. He was prescribed Allegra 180mg  daily and saline nasal spray, which he is not using. His cough seemed to improve initially but is now fairly persistent and remains dry for the most part, occasionally coughing up clear mucus. The cough is present throughout the day and night, and is no worse any particular time of the day.    Past Medical History  Diagnosis Date  . Hypertension   . Dysuria   . Urge incontinence   . Adenomatous colon polyp 12/14/2005    5 mm polyp in descending colon, found on colonoscopy by Dr. Wilford Corner  . Occult blood in stools   . Insomnia   . Allergic rhinitis   . ED (erectile dysfunction)   . History of prostate cancer     S/P EXTERNAL RADIATION, SEED IMPLANTS  2009  . History of bladder cancer     S/P TURBT 2010  . Bladder calculi   . Bladder neck contracture   . OSA on CPAP     Severe by diagnostic polysomnogram 06/22/2004.  Marland Kitchen Hepatitis, unspecified PT STATES HAD HEPATITIS APPROX 1980'S ; UNSURE WHAT TYPE    Hepatitis C RNA Quant on July 06, 2007 showed no detectable virus.  . Hand eczema   . Arthritis   . Nocturia    Current Outpatient Prescriptions  Medication Sig Dispense Refill  . clobetasol ointment (TEMOVATE) 3.66 % Apply 1 application topically at bedtime. Apply to affected areas on hands.  30 g  1  . fexofenadine (ALLEGRA) 180 MG tablet Take 1 tablet (180 mg total) by mouth daily.  30 tablet  3  . hydrochlorothiazide (HYDRODIURIL) 25 MG tablet Take 1  tablet (25 mg total) by mouth daily.  30 tablet  9  . losartan (COZAAR) 25 MG tablet Take 3 tablets (75 mg total) by mouth daily.  90 tablet  6  . sodium chloride (OCEAN) 0.65 % SOLN nasal spray Place 2 sprays into both nostrils 4 (four) times daily.  30 mL  3   No current facility-administered medications for this visit.   Family History  Problem Relation Age of Onset  . Cancer Neg Hx   . Diabetes Neg Hx   . Heart attack Mother 28   History   Social History  . Marital Status: Married    Spouse Name: N/A    Number of Children: N/A  . Years of Education: N/A   Social History Main Topics  . Smoking status: Former Smoker -- 0.25 packs/day for 25 years    Types: Cigarettes    Quit date: 11/25/2004  . Smokeless tobacco: Never Used  . Alcohol Use: None  . Drug Use: No  . Sexual Activity: None   Other Topics Concern  . None   Social History Narrative  . None   Review of Systems: He denies fevers, chills, chest pain. +SOB with dry cough.   Objective:  Physical Exam: Filed Vitals:  05/14/14 1612  BP: 166/77  Pulse: 71  Temp: 97.9 F (36.6 C)  TempSrc: Oral  Height: 5\' 10"  (1.778 m)  Weight: 229 lb 12.8 oz (104.237 kg)  SpO2: 99%   Constitutional: Vital signs reviewed.  Patient is a well-developed and well-nourished male in no acute distress and cooperative with exam. Alert and oriented x3.  Head: Normocephalic and atraumatic Eyes: PERRL, EOMI.  Nose: MMM, mild areas of mucus, no significant edema Mouth: MMM, unable to visualize posterior oropharynx Cardiovascular: RRR, no MRG Pulmonary/Chest: Normal respiratory effort, CTAB, no wheezes, rales, or rhonchi Abdominal: Soft. Non-tender, non-distended Musculoskeletal: Moves all 4 extremities Neurological: A&O x3, cranial nerve II-XII are grossly intact, no focal deficits Skin: Warm, dry and intact.   Psychiatric: Normal mood and affect. speech and behavior is normal.   Assessment & Plan:   Please refer to Problem  List based Assessment and Plan

## 2014-05-16 NOTE — Progress Notes (Signed)
Case discussed with Dr. Eulas Post soon after the resident saw the patient.  We reviewed the resident's history and exam and pertinent patient test results.  I agree with the assessment, diagnosis and plan of care documented in the resident's note.  If cough persists the ARB must be discontinued to see if it is the cause.

## 2014-05-19 ENCOUNTER — Other Ambulatory Visit: Payer: Self-pay | Admitting: Internal Medicine

## 2014-05-21 ENCOUNTER — Other Ambulatory Visit: Payer: Self-pay | Admitting: Internal Medicine

## 2014-05-21 NOTE — Telephone Encounter (Signed)
I changed his losartan dose to 25 mg tablets take three tablets daily (total of 75 mg once daily) in February of this year.  This refill request is for the 50 mg tablet once daily.  Please find out which dose he has been taking.

## 2014-05-22 NOTE — Telephone Encounter (Signed)
Pt's wife checked - pt is taking 50mg  daily. Also states Hycodan cough syrup ordered last week by Dr Eulas Post is too expensive too buy again and pt is still coughing. Can u order something else? Thanks

## 2014-05-22 NOTE — Telephone Encounter (Signed)
Pt called; no answer - left message to called me back.

## 2014-05-23 ENCOUNTER — Telehealth: Payer: Self-pay | Admitting: *Deleted

## 2014-05-23 NOTE — Telephone Encounter (Signed)
I would advise that he be seen in clinic and reassessed since he has persistent cough.

## 2014-05-23 NOTE — Telephone Encounter (Signed)
Called pt - not at home; wife stated his cough is a little better but will let him know to call the clinic to schedule an appt if cough persists, becomes worse.

## 2014-05-23 NOTE — Telephone Encounter (Signed)
Pt's wife states pt is still coughing and needs something else; states Hycodan syrup is too expensive to purchase again. Can something else be ordered?  Thanks

## 2014-05-29 ENCOUNTER — Encounter: Payer: Self-pay | Admitting: Internal Medicine

## 2014-05-29 ENCOUNTER — Ambulatory Visit (INDEPENDENT_AMBULATORY_CARE_PROVIDER_SITE_OTHER): Payer: Commercial Managed Care - HMO | Admitting: Internal Medicine

## 2014-05-29 VITALS — BP 154/89 | HR 66 | Temp 97.9°F | Ht 70.5 in | Wt 229.2 lb

## 2014-05-29 DIAGNOSIS — Z23 Encounter for immunization: Secondary | ICD-10-CM

## 2014-05-29 DIAGNOSIS — J4530 Mild persistent asthma, uncomplicated: Secondary | ICD-10-CM

## 2014-05-29 DIAGNOSIS — I10 Essential (primary) hypertension: Secondary | ICD-10-CM

## 2014-05-29 MED ORDER — ALBUTEROL SULFATE HFA 108 (90 BASE) MCG/ACT IN AERS: 2.0000 | INHALATION_SPRAY | Freq: Four times a day (QID) | RESPIRATORY_TRACT | Status: DC | PRN

## 2014-05-29 MED ORDER — GUAIFENESIN-DM 100-10 MG/5ML PO SYRP: 5.0000 mL | ORAL_SOLUTION | ORAL | Status: DC | PRN

## 2014-05-29 NOTE — Progress Notes (Signed)
Patient ID: [name], male   DOB: 1936/06/01, 78 y.o.   MRN: [id number]   Subjective:   HPI: Mr.[name] is a 78 y.o. M w/ PMH HTN, prostate cancer, OSA, who presents 4 clinic follow up for his cough.    Reason(s) for this visit: Cough: Patient was last seen by Dr. Eulas Post in this office on 05/14/2014 for cough. He was prescribed Hycodan cough syrup, 69mL q6h PRN cough, saline nasal sprays and encouraged to continue with the Allegra 180 mg daily as his cough had some allergic component. He reports that this treatment has not helped. The cough has continued, productive of yellow sputum, without hemoptysis. Has chest pain, when he coughs, but denies chest pain on deep breath. His breathing has been stable. However, he does have wheezing, especially at night time. Patient does not have a history of asthma and is not a current smoker. He has never used inhalers. He describes a lot of upper airway symptoms including postnasal drips and sore throat with rhinorrhea and and facial pain. He denies muscle aches, fevers, chills, or appetite, or increased fatigue.he has not had contact with person with similar symptoms. He and his wife are very concerned about persistence of symptoms and lack of significant improvement with treatment.  ROS: Constitutional: Denies fever, chills, diaphoresis, appetite change and fatigue.  CVS: No chest pain, palpitations and leg swelling.  GI: No abdominal pain, nausea, vomiting, bloody stools GU: No dysuria, frequency, hematuria, or flank pain.  MSK: No myalgias, back pain, joint swelling, arthralgias  Psych: No depression symptoms. No SI or SA.    Objective:  Physical Exam: Filed Vitals:   05/29/14 0859 05/29/14 0937  BP: 153/74 154/89  Pulse: 61 66  Temp: 97.9 F (36.6 C)   TempSrc: Oral   Height: 5' 10.5" (1.791 m)   Weight: 229 lb 3.2 oz (103.964 kg)   SpO2: 100%    General: Well nourished. No acute distress.  HEENT: Normal oral mucosa. MMM.  Oropharynx clear without hyperemia or exudates. Upper airway otherwise widely patent. Lungs: normal work of breathing, however, has bilateral mild wheezing. No rales are appreciated. Heart: RRR; no extra sounds or murmurs  Abdomen: Non-distended, normal bowel sounds, soft, nontender; no hepatosplenomegaly  Extremities: No pedal edema. No joint swelling or tenderness. Neurologic: Normal EOM,  Alert and oriented x3. No obvious neurologic/cranial nerve deficits.  Assessment & Plan:  Discussed case with my attending in the clinic, Dr. Eppie Gibson. See problem based charting.

## 2014-05-29 NOTE — Assessment & Plan Note (Signed)
BP Readings from Last 3 Encounters:  05/29/14 154/89  05/14/14 166/77  04/12/14 131/70    Lab Results  Component Value Date   NA 138 09/20/2013   K 3.9 09/20/2013   CREATININE 1.23 09/20/2013    Assessment: Blood pressure control: mildly elevated Progress toward BP goal:  deteriorated Comments: compliant with losartan, 50 mg daily.  Plan: Medications:  continue current medications Educational resources provided:   Self management tools provided: other (see comments) Other plans: will not increase his current dose of losartan until when he gets over his acute illness and reassess blood pressure.

## 2014-05-29 NOTE — Patient Instructions (Signed)
General Instructions: Please take your medications as prescribed  If the cough persists longer than 2 months, please come back to be seen here  Thank you for bringing your medicines today. This helps [place] keep you safe from mistakes.  Acute Bronchitis Bronchitis is inflammation of the airways that extend from the windpipe into the lungs (bronchi). The inflammation often causes mucus to develop. This leads to a cough, which is the most common symptom of bronchitis.  In acute bronchitis, the condition usually develops suddenly and goes away over time, usually in a couple weeks. Smoking, allergies, and asthma can make bronchitis worse. Repeated episodes of bronchitis may cause further lung problems.  CAUSES Acute bronchitis is most often caused by the same virus that causes a cold. The virus can spread from person to person (contagious) through coughing, sneezing, and touching contaminated objects. SIGNS AND SYMPTOMS   Cough.   Fever.   Coughing up mucus.   Body aches.   Chest congestion.   Chills.   Shortness of breath.   Sore throat.  DIAGNOSIS  Acute bronchitis is usually diagnosed through a physical exam. Your health care provider will also ask you questions about your medical history. Tests, such as chest X-rays, are sometimes done to rule out other conditions.  TREATMENT  Acute bronchitis usually goes away in a couple weeks. Oftentimes, no medical treatment is necessary. Medicines are sometimes given for relief of fever or cough. Antibiotic medicines are usually not needed but may be prescribed in certain situations. In some cases, an inhaler may be recommended to help reduce shortness of breath and control the cough. A cool mist vaporizer may also be used to help thin bronchial secretions and make it easier to clear the chest.  HOME CARE INSTRUCTIONS  Get plenty of rest.   Drink enough fluids to keep your urine clear or pale yellow (unless you have a medical condition  that requires fluid restriction). Increasing fluids may help thin your respiratory secretions (sputum) and reduce chest congestion, and it will prevent dehydration.   Take medicines only as directed by your health care provider.  If you were prescribed an antibiotic medicine, finish it all even if you start to feel better.  Avoid smoking and secondhand smoke. Exposure to cigarette smoke or irritating chemicals will make bronchitis worse. If you are a smoker, consider using nicotine gum or skin patches to help control withdrawal symptoms. Quitting smoking will help your lungs heal faster.   Reduce the chances of another bout of acute bronchitis by washing your hands frequently, avoiding people with cold symptoms, and trying not to touch your hands to your mouth, nose, or eyes.   Keep all follow-up visits as directed by your health care provider.  SEEK MEDICAL CARE IF: Your symptoms do not improve after 1 week of treatment.  SEEK IMMEDIATE MEDICAL CARE IF:  You develop an increased fever or chills.   You have chest pain.   You have severe shortness of breath.  You have bloody sputum.   You develop dehydration.  You faint or repeatedly feel like you are going to pass out.  You develop repeated vomiting.  You develop a severe headache. MAKE SURE YOU:   Understand these instructions.  Will watch your condition.  Will get help right away if you are not doing well or get worse. Document Released: 09/03/2004 Document Revised: 12/11/2013 Document Reviewed: 01/17/2013 [facility] Patient Information 2015 [place], [place]. This information is not intended to replace advice given to you by  your health care provider. Make sure you discuss any questions you have with your health care provider.  Progress Toward Treatment Goals:  Treatment Goal 05/29/2014  Blood pressure deteriorated    Self Care Goals & Plans:  Self Care Goal 05/29/2014  Manage my medications take my medicines as  prescribed; bring my medications to every visit; refill my medications on time  Monitor my health -  Eat healthy foods drink diet soda or water instead of juice or soda; eat more vegetables; eat foods that are low in salt; eat baked foods instead of fried foods; eat fruit for snacks and desserts  Be physically active -  Meeting treatment goals -    No flowsheet data found.   Care Management & Community Referrals:  Referral 04/12/2014  Referrals made for care management support none needed  Referrals made to community resources -

## 2014-05-29 NOTE — Progress Notes (Signed)
I saw and evaluated the patient. I personally confirmed the key portions of Dr. Arsenio Katz history and exam and reviewed pertinent patient test results. The assessment, diagnosis, and plan were formulated together and I agree with the documentation in the resident's note.  The cough is now technically sub-acute and is likely persistent related to a viral URI.  I explained to him that for some individuals the cough takes a little longer to resolve after an acute infection.  If it were to persist for > 8 weeks he will have a chronic cough.  At that point I would hold the losartan and assess cough and subsequently consider an upper airway cough syndrome as the cause and treat accordingly if it were to continue despite holding the losartan.  With some obstruction on examination, PRN albuterol is appropriate.

## 2014-05-29 NOTE — Assessment & Plan Note (Signed)
Patient's cough has somehow become persistent for at least 3 weeks. Review of his chart reveals that he was first seen on 04/13/2014 for cough, but this episode seems to have improved with treatment. He was again seen on 05/14/2014 for another episode of cough, which has persisted, despite cough, regimens and thus his visit today. He is frustrated by his symptoms. Most likely he has a viral bronchitis with a reactive airway disease component accounting for his shortness of breath with wheezing and lung exam which reveals fine bilateral wheezes. I do not suspect pneumonia this point without supporting symptoms or physical examination findings. I discussed with the patient at length that his symptoms may last for up to 6-8 weeks and we have no specific treatment for acute bronchitis, except symptom management. I don't recommends antibiotics as this is a viral illness. I instructed him to seek medical attention if he develops fevers, chest pain on deep breathing, shortness of breath, or if he just feels too sicker.  Plan -Will try Robitussin-DM. -Albuterol inhaler for wheezing and shortness of breath. This will likely help his reactive airway component -Would expect his symptoms to improve in 6-8 weeks and if they persist beyond that, patient can be evaluated for other causes of chronic cough. -Consider stopping losartan if cough persists for longer period. -He will be seen on an as-needed basis based on his symptomatology.

## 2014-06-01 NOTE — Addendum Note (Signed)
Addended by: Hulan Fray on: 06/01/2014 05:16 PM   Modules accepted: Orders

## 2014-07-18 ENCOUNTER — Encounter: Payer: Self-pay | Admitting: Internal Medicine

## 2014-07-18 ENCOUNTER — Ambulatory Visit (INDEPENDENT_AMBULATORY_CARE_PROVIDER_SITE_OTHER): Payer: Commercial Managed Care - HMO | Admitting: Internal Medicine

## 2014-07-18 VITALS — BP 152/80 | HR 64 | Temp 97.9°F | Wt 235.5 lb

## 2014-07-18 DIAGNOSIS — R197 Diarrhea, unspecified: Secondary | ICD-10-CM | POA: Insufficient documentation

## 2014-07-18 DIAGNOSIS — C7951 Secondary malignant neoplasm of bone: Secondary | ICD-10-CM | POA: Insufficient documentation

## 2014-07-18 DIAGNOSIS — C61 Malignant neoplasm of prostate: Secondary | ICD-10-CM

## 2014-07-18 DIAGNOSIS — I10 Essential (primary) hypertension: Secondary | ICD-10-CM

## 2014-07-18 DIAGNOSIS — L309 Dermatitis, unspecified: Secondary | ICD-10-CM

## 2014-07-18 DIAGNOSIS — D126 Benign neoplasm of colon, unspecified: Secondary | ICD-10-CM

## 2014-07-18 MED ORDER — LOSARTAN POTASSIUM 50 MG PO TABS: 75.0000 mg | ORAL_TABLET | Freq: Every day | ORAL | Status: DC

## 2014-07-18 MED ORDER — HYDROCHLOROTHIAZIDE 25 MG PO TABS: 25.0000 mg | ORAL_TABLET | Freq: Every day | ORAL | Status: DC

## 2014-07-18 NOTE — Assessment & Plan Note (Signed)
Assessment: I again advised patient to followup with Dr. Michail Sermon for surveillance colonoscopy, and I explained to him the importance of doing so given his history of adenomatous colonic polyp and the risk of developing colon cancer.  Plan: Refer to Dr. Michail Sermon for colonoscopy.

## 2014-07-18 NOTE — Assessment & Plan Note (Signed)
BP Readings from Last 3 Encounters:  07/18/14 152/80  05/29/14 154/89  05/14/14 166/77    Lab Results  Component Value Date   NA 138 09/20/2013   K 3.9 09/20/2013   CREATININE 1.23 09/20/2013    Assessment: Blood pressure control: mildly elevated Progress toward BP goal:  unchanged Comments: Patient's blood pressure is mildly elevated on hydrochlorothiazide 25 mg daily and losartan 50 mg daily.  I previously had increased his losartan to a dose of 75 mg daily in February of this year, but after 1 or 2 months patient apparently refilled losartan at his prior dose of 50 mg daily and has been taking 50 mg daily since.  Plan: Medications:  Increase losartan to a dose of 75 mg daily; continue hydrochlorothiazide 25 mg daily Educational resources provided: brochure Self management tools provided: home blood pressure logbook Other plans: Check a comprehensive metabolic panel and lipid panel today

## 2014-07-18 NOTE — Progress Notes (Signed)
   Subjective:    Patient ID: [name], male    DOB: 21-Aug-1935, 77 y.o.   MRN: [id number]  HPI Patient returns for follow-up of his hypertension, prostate cancer, eczema, and other chronic medical problems.  His main complaint today is diarrhea for the past 2 weeks with frequent loose but not watery stools.  He has some occasional mild abdominal cramping with the diarrhea, but no other abdominal pain and none today.  He reports that no other household members are sick with diarrhea.  He has had no fever, chills, sweats, nausea, vomiting, or blood in his stool.  Patient reports that he did have teeth extracted about 2 weeks ago and was treated with a short course of amoxicillin at that time.  He reports that he has been compliant with his medications.  His hand eczema is reasonably well controlled with Temovate at bedtime on an as-needed basis.  Patient's prostate cancer was found to be metastatic to bone based upon a bone scan done by his urologist Dr. Diona Fanti in March of this year, and he was subsequently started on androgen deprivation therapy by Dr. Diona Fanti.  He denies any bone pain.   Review of Systems  Constitutional: Negative for fever, chills and diaphoresis.  Respiratory: Negative for cough, shortness of breath and wheezing.   Cardiovascular: Negative for chest pain, palpitations and leg swelling.  Gastrointestinal: Negative for nausea, vomiting and blood in stool.  Endocrine: Negative for polyuria.  Genitourinary: Negative for dysuria, scrotal swelling, difficulty urinating, penile pain and testicular pain.  Musculoskeletal: Negative for myalgias, back pain and arthralgias.  Neurological: Negative for dizziness, weakness, numbness and headaches.    I reviewed and updated the medication list, allergies, past medical history, past surgical history, family history, and social history.      Objective:   Physical Exam  Constitutional: No distress.  Cardiovascular: Normal  rate, regular rhythm and normal heart sounds.  Exam reveals no gallop and no friction rub.   No murmur heard. Pulmonary/Chest: Effort normal and breath sounds normal. No respiratory distress. He has no wheezes. He has no rales.  Abdominal: Soft. Bowel sounds are normal. He exhibits no distension. There is no tenderness. There is no rebound and no guarding.  Musculoskeletal: He exhibits no edema.       Assessment & Plan:

## 2014-07-18 NOTE — Assessment & Plan Note (Signed)
Assessment: Patient reports loose stools for the past 2 weeks, but has no frankly watery bowel movements; he denies systemic symptoms, and also denies abdominal pain, nausea, vomiting, or blood in his stools.  This is likely a self-limited gastroenteritis, or may possibly be related to his recent short course of antibiotics.  Plan: Supplies and instructions were provided today so the patient can collect a stool specimen and bring in to clinic.  Plan is to send for fecal lactoferrin, qualitative fecal fat, stool culture, and C. difficile assay.  I discussed with him the option of using Pepto-Bismol or Imodium for symptomatic treatment.  I also advised him to call or return if his symptoms persist or worsen.

## 2014-07-18 NOTE — Assessment & Plan Note (Signed)
Assessment: Patient was initially diagnosed with stage T1c prostate cancer by Dr. Alona Bene in 2009, and treated with Lupron followed by external beam radiation and brachytherapy.  He is followed by urologist Dr. Diona Fanti.  A bone scan on 10/09/2013 (done because of increased PSA) showed skeletal metastasis within the lateral aspect of the second rib, and small lesions at T2 and T10 concerning for thoracic spinal metastasis within the vertebral bodies.  Patient's urologist Dr. Diona Fanti started androgen deprivation therapy with Firmagon on 11/01/2013, followed by Lupron Depot injections which patient is currently receiving every 4 months.  The last dose of Lupron Depot was given 06/04/2014.  Per Dr. Diona Fanti, patient has had an excellent PSA response to the androgen deprivation therapy.  He also recently had teeth pulled in preparation for bone therapy with XGeva planned by Dr. Diona Fanti.  Plan: Check PSA today; patient will follow-up with Dr. Diona Fanti as scheduled.

## 2014-07-18 NOTE — Assessment & Plan Note (Signed)
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  He does not use the ointment on a regular basis, only when he is experiencing an increase in symptoms.    Plan: Continue current treatment regimen.  Patient was referred to dermatology by Dr. Hayes Ludwig for management of eczema in September, but I do not see where this referral was approved or completed.  Plan is to try to determine the status of the referral.

## 2014-07-18 NOTE — Patient Instructions (Signed)
General Instructions: Increase losartan (Cozaar) 50 mg tablet to a dose of 1-1/2 tablets daily. Please follow up with Dr. Diona Fanti for management of your prostate cancer. A referral has been made to Dr. Michail Sermon for screening colonoscopy due to your history of colon polyps.   Progress Toward Treatment Goals:  Treatment Goal 07/18/2014  Blood pressure unchanged    Self Care Goals & Plans:  Self Care Goal 07/18/2014  Manage my medications take my medicines as prescribed; refill my medications on time  Monitor my health keep track of my blood pressure  Eat healthy foods eat foods that are low in salt; eat baked foods instead of fried foods  Be physically active find an activity I enjoy  Meeting treatment goals -

## 2014-07-19 ENCOUNTER — Other Ambulatory Visit: Payer: Self-pay | Admitting: Internal Medicine

## 2014-07-19 ENCOUNTER — Other Ambulatory Visit: Payer: Commercial Managed Care - HMO

## 2014-07-19 LAB — COMPLETE METABOLIC PANEL WITH GFR
ALK PHOS: 57 U/L (ref 39–117)
ALT: 85 U/L — ABNORMAL HIGH (ref 0–53)
AST: 57 U/L — AB (ref 0–37)
Albumin: 4 g/dL (ref 3.5–5.2)
BUN: 13 mg/dL (ref 6–23)
CALCIUM: 9.7 mg/dL (ref 8.4–10.5)
CHLORIDE: 106 meq/L (ref 96–112)
CO2: 23 mEq/L (ref 19–32)
Creat: 0.86 mg/dL (ref 0.50–1.35)
GFR, Est African American: >89 mL/min
GFR, Est Non African American: 84 mL/min
Glucose, Bld: 95 mg/dL (ref 70–99)
Potassium: 3.8 mEq/L (ref 3.5–5.3)
Sodium: 139 mEq/L (ref 135–145)
Total Bilirubin: 0.5 mg/dL (ref 0.2–1.2)
Total Protein: 7.4 g/dL (ref 6.0–8.3)

## 2014-07-19 LAB — LIPID PANEL
Cholesterol: 165 mg/dL (ref 0–200)
HDL: 31 mg/dL — AB (ref >39)
LDL CALC: 90 mg/dL (ref 0–99)
Total CHOL/HDL Ratio: 5.3 Ratio
Triglycerides: 219 mg/dL — ABNORMAL HIGH (ref <150)
VLDL: 44 mg/dL — AB (ref 0–40)

## 2014-07-19 LAB — CBC WITH DIFFERENTIAL/PLATELET
BASOS ABS: 0 10*3/uL (ref 0.0–0.1)
Basophils Relative: 0 % (ref 0–1)
Eosinophils Absolute: 0.2 10*3/uL (ref 0.0–0.7)
Eosinophils Relative: 3 % (ref 0–5)
HCT: 42.6 % (ref 39.0–52.0)
Hemoglobin: 14 g/dL (ref 13.0–17.0)
LYMPHS PCT: 52 % — AB (ref 12–46)
Lymphs Abs: 2.6 10*3/uL (ref 0.7–4.0)
MCH: 30.2 pg (ref 26.0–34.0)
MCHC: 32.9 g/dL (ref 30.0–36.0)
MCV: 92 fL (ref 78.0–100.0)
MONO ABS: 0.5 10*3/uL (ref 0.1–1.0)
MPV: 11.4 fL (ref 9.4–12.4)
Monocytes Relative: 10 % (ref 3–12)
Neutro Abs: 1.8 10*3/uL (ref 1.7–7.7)
Neutrophils Relative %: 35 % — ABNORMAL LOW (ref 43–77)
PLATELETS: 167 10*3/uL (ref 150–400)
RBC: 4.63 MIL/uL (ref 4.22–5.81)
RDW: 14.3 % (ref 11.5–15.5)
WBC: 5 10*3/uL (ref 4.0–10.5)

## 2014-07-19 LAB — PSA: PSA: 0.07 ng/mL (ref <=4.00)

## 2014-07-20 LAB — C. DIFFICILE GDH AND TOXIN A/B
C. difficile GDH: NOT DETECTED
C. difficile Toxin A/B: NOT DETECTED

## 2014-07-20 LAB — FECAL LACTOFERRIN, QUANT: Lactoferrin: POSITIVE

## 2014-07-23 LAB — STOOL CULTURE

## 2014-07-24 LAB — FECAL FAT, QUALITATIVE: Fecal Fat Qualitative: NORMAL

## 2014-08-16 ENCOUNTER — Telehealth: Payer: Self-pay | Admitting: *Deleted

## 2014-08-16 ENCOUNTER — Encounter: Payer: Self-pay | Admitting: *Deleted

## 2014-08-16 NOTE — Telephone Encounter (Signed)
CALLED PATIENT SPOKE WITH WIFE. LEFT MESSAGE FOR MR. Stahnke TO CALL DR SCHOOLER'S OFFICE. THIS NEEDS TO BE DONE BEFORE WE AR ABLE TO MAKE APPOINTMENT FOR HIM.

## 2014-10-17 ENCOUNTER — Other Ambulatory Visit: Payer: Self-pay | Admitting: Internal Medicine

## 2014-10-26 ENCOUNTER — Encounter: Payer: Self-pay | Admitting: *Deleted

## 2014-10-30 ENCOUNTER — Ambulatory Visit (INDEPENDENT_AMBULATORY_CARE_PROVIDER_SITE_OTHER): Payer: Commercial Managed Care - HMO | Admitting: Internal Medicine

## 2014-10-30 ENCOUNTER — Encounter: Payer: Self-pay | Admitting: Internal Medicine

## 2014-10-30 VITALS — BP 176/86 | HR 57 | Temp 97.8°F | Wt 239.2 lb

## 2014-10-30 DIAGNOSIS — Z8619 Personal history of other infectious and parasitic diseases: Secondary | ICD-10-CM

## 2014-10-30 DIAGNOSIS — C7951 Secondary malignant neoplasm of bone: Secondary | ICD-10-CM | POA: Diagnosis not present

## 2014-10-30 DIAGNOSIS — L988 Other specified disorders of the skin and subcutaneous tissue: Secondary | ICD-10-CM | POA: Diagnosis not present

## 2014-10-30 DIAGNOSIS — C61 Malignant neoplasm of prostate: Secondary | ICD-10-CM | POA: Diagnosis not present

## 2014-10-30 DIAGNOSIS — L989 Disorder of the skin and subcutaneous tissue, unspecified: Secondary | ICD-10-CM

## 2014-10-30 DIAGNOSIS — D126 Benign neoplasm of colon, unspecified: Secondary | ICD-10-CM

## 2014-10-30 DIAGNOSIS — Z23 Encounter for immunization: Secondary | ICD-10-CM

## 2014-10-30 DIAGNOSIS — L739 Follicular disorder, unspecified: Secondary | ICD-10-CM | POA: Diagnosis not present

## 2014-10-30 DIAGNOSIS — I10 Essential (primary) hypertension: Secondary | ICD-10-CM

## 2014-10-30 LAB — CBC WITH DIFFERENTIAL/PLATELET
BASOS PCT: 0 % (ref 0–1)
Basophils Absolute: 0 10*3/uL (ref 0.0–0.1)
EOS PCT: 4 % (ref 0–5)
Eosinophils Absolute: 0.2 10*3/uL (ref 0.0–0.7)
HCT: 42.8 % (ref 39.0–52.0)
HEMOGLOBIN: 14.3 g/dL (ref 13.0–17.0)
Lymphocytes Relative: 51 % — ABNORMAL HIGH (ref 12–46)
Lymphs Abs: 2.8 10*3/uL (ref 0.7–4.0)
MCH: 30.8 pg (ref 26.0–34.0)
MCHC: 33.4 g/dL (ref 30.0–36.0)
MCV: 92.2 fL (ref 78.0–100.0)
MPV: 10.8 fL (ref 8.6–12.4)
Monocytes Absolute: 0.5 10*3/uL (ref 0.1–1.0)
Monocytes Relative: 9 % (ref 3–12)
NEUTROS PCT: 36 % — AB (ref 43–77)
Neutro Abs: 1.9 10*3/uL (ref 1.7–7.7)
Platelets: 200 10*3/uL (ref 150–400)
RBC: 4.64 MIL/uL (ref 4.22–5.81)
RDW: 14.6 % (ref 11.5–15.5)
WBC: 5.4 10*3/uL (ref 4.0–10.5)

## 2014-10-30 LAB — COMPLETE METABOLIC PANEL WITH GFR
ALK PHOS: 53 U/L (ref 39–117)
ALT: 83 U/L — ABNORMAL HIGH (ref 0–53)
AST: 74 U/L — ABNORMAL HIGH (ref 0–37)
Albumin: 4.2 g/dL (ref 3.5–5.2)
BUN: 12 mg/dL (ref 6–23)
CO2: 28 mEq/L (ref 19–32)
Calcium: 9.3 mg/dL (ref 8.4–10.5)
Chloride: 106 mEq/L (ref 96–112)
Creat: 0.98 mg/dL (ref 0.50–1.35)
GFR, EST NON AFRICAN AMERICAN: 75 mL/min
GFR, Est African American: 86 mL/min
Glucose, Bld: 96 mg/dL (ref 70–99)
POTASSIUM: 4.1 meq/L (ref 3.5–5.3)
SODIUM: 140 meq/L (ref 135–145)
Total Bilirubin: 0.5 mg/dL (ref 0.2–1.2)
Total Protein: 7.3 g/dL (ref 6.0–8.3)

## 2014-10-30 MED ORDER — MUPIROCIN 2 % EX OINT: 1.0000 "application " | TOPICAL_OINTMENT | Freq: Three times a day (TID) | CUTANEOUS | Status: DC

## 2014-10-30 MED ORDER — LOSARTAN POTASSIUM 50 MG PO TABS: 100.0000 mg | ORAL_TABLET | Freq: Every day | ORAL | Status: DC

## 2014-10-30 NOTE — Progress Notes (Signed)
   Subjective:    Patient ID: [name], male    DOB: 01-Sep-1935, 79 y.o.   MRN: [id number]  HPI Patient returns for management of his hypertension, prostate cancer, and other chronic problems.  His main complaint today is a few small tender bumps that are currently present on his upper thighs and scrotum; he has had these in the past in his axillary areas but they resolved.  He also reports a palpable lesion on his right scalp above the ear.   Review of Systems  Constitutional: Negative for fever, chills and diaphoresis.  Respiratory: Negative for cough and shortness of breath.   Cardiovascular: Negative for chest pain and leg swelling.  Gastrointestinal: Negative for nausea, vomiting, abdominal pain, blood in stool and anal bleeding.  Genitourinary: Positive for frequency (Since prostate surgery). Negative for dysuria and difficulty urinating.  Musculoskeletal: Negative for myalgias and arthralgias.  Neurological: Negative for dizziness and syncope.    I reviewed and updated the medication list, allergies, past medical history, past surgical history, family history, and social history.     Objective:   Physical Exam  Constitutional: No distress.  Cardiovascular: Normal rate, regular rhythm and normal heart sounds.  Exam reveals no gallop and no friction rub.   No murmur heard. No lower extremity edema.  Pulmonary/Chest: Effort normal and breath sounds normal. No respiratory distress. He has no wheezes. He has no rales.  Abdominal: Soft. Bowel sounds are normal. He exhibits no distension. There is no tenderness. There is no rebound and no guarding.  Skin:          Assessment & Plan:

## 2014-10-30 NOTE — Patient Instructions (Addendum)
General Instructions: Increase losartan (Cozaar) 50 mg tablet to a dose of two tablets daily. Please follow up with Dr. Diona Fanti for management of your prostate cancer. Please follow-up with Dr. Michail Sermon for screening colonoscopy due to your history of colon polyps. A referral has been made to dermatology for evaluation of the lesion on your right scalp.   Apply mupirocin ointment to the affected area of skin (bumps) 3 times a day for up to 10 days; call or return if these do not improve.

## 2014-10-31 LAB — HEPATITIS B SURFACE ANTIGEN: HEP B S AG: NEGATIVE

## 2014-10-31 LAB — HEPATITIS B SURFACE ANTIBODY,QUALITATIVE: Hep B S Ab: POSITIVE — AB

## 2014-10-31 LAB — HEPATITIS B CORE ANTIBODY, TOTAL: Hep B Core Total Ab: REACTIVE — AB

## 2014-10-31 LAB — HEPATITIS C ANTIBODY: HCV Ab: NEGATIVE

## 2014-10-31 LAB — PSA: PSA: 0.07 ng/mL (ref <=4.00)

## 2014-10-31 NOTE — Assessment & Plan Note (Signed)
BP Readings from Last 3 Encounters:  10/30/14 176/86  07/18/14 152/80  05/29/14 154/89    Lab Results  Component Value Date   NA 140 10/30/2014   K 4.1 10/30/2014   CREATININE 0.98 10/30/2014    Assessment: Blood pressure control: moderately elevated Progress toward BP goal:  deteriorated Comments: Blood pressure is moderately elevated on hydrochlorothiazide 25 mg daily and losartan 75 mg daily.  Plan: Medications:  Increase losartan to a dose of 100 mg daily; continue Hydrocort thiazide 25 mg daily. Educational resources provided: brochure Self management tools provided: home blood pressure logbook

## 2014-10-31 NOTE — Assessment & Plan Note (Signed)
Assessment: Patient is currently being managed by his urologist Dr Diona Fanti with androgen deprivation therapy.  He appears to be doing well symptomatically.    Plan: Check a comprehensive metabolic panel, CBC with differential, and PSA; will forward results to Dr. Diona Fanti.  Patient has an appointment with Dr. Diona Fanti in April.

## 2014-10-31 NOTE — Assessment & Plan Note (Signed)
Assessment: Patient has a few scattered erythematous small tender papules on his medial thighs, and one lesion on his scrotum.  This appears to be a mild case of folliculitis.  He reports some improvement with topical Neosporin cream.  Plan: Treat with mupirocin ointment to the affected area 3 times a day for up to 10 days; I advised patient to call or return if these do not improve

## 2014-10-31 NOTE — Assessment & Plan Note (Signed)
Assessment: Patient has not yet scheduled an appointment with his gastroneurologist Dr. Hubbard Hartshorn for surveillance colonoscopy, and I again explained to him the importance of doing so given his history of adenomatous colonic polyp and the risk of developing colon cancer.  Plan: Referred to Dr. Michail Sermon for colonoscopy.

## 2014-10-31 NOTE — Assessment & Plan Note (Signed)
Assessment: Patient has a small raised crusted skin lesion of the scalp above the right ear.  Plan: Dermatology referral.

## 2014-11-01 ENCOUNTER — Telehealth: Payer: Self-pay | Admitting: Internal Medicine

## 2014-11-01 DIAGNOSIS — Z8619 Personal history of other infectious and parasitic diseases: Secondary | ICD-10-CM

## 2014-11-01 DIAGNOSIS — R7401 Elevation of levels of liver transaminase levels: Secondary | ICD-10-CM | POA: Insufficient documentation

## 2014-11-01 DIAGNOSIS — R740 Nonspecific elevation of levels of transaminase and lactic acid dehydrogenase [LDH]: Principal | ICD-10-CM

## 2014-11-01 LAB — HEPATITIS C RNA QUANTITATIVE: HCV QUANT: NOT DETECTED [IU]/mL (ref <15)

## 2014-11-01 NOTE — Progress Notes (Signed)
Quick Note:  Hepatitis C serology shows no evidence of hepatitis C. ______

## 2014-11-01 NOTE — Telephone Encounter (Signed)
Patient has asymptomatic mild elevation of transaminase levels.  Hepatitis B serology indicates the patient is immune due to natural infection; there was no evidence of hepatitis C.  Plan is obtain right upper quadrant ultrasound.  I called patient and discussed his lab results and the plans for ultrasound with him.

## 2014-11-01 NOTE — Progress Notes (Signed)
Quick Note:  Patient has asymptomatic mild elevation of transaminase levels. Hepatitis B serology indicates the patient is immune due to natural infection; there was no evidence of hepatitis C. Plan is obtain right upper quadrant ultrasound. I called patient and discussed his lab results and the plans for ultrasound with him. ______

## 2014-11-01 NOTE — Progress Notes (Signed)
Quick Note:  PSA is low; patient has follow-up with his urologist next month for management of his prostate cancer. ______

## 2014-11-01 NOTE — Progress Notes (Signed)
Quick Note:  Hepatitis B serology indicates that patient is immune due to natural infection. ______

## 2014-11-01 NOTE — Assessment & Plan Note (Signed)
Telephone Contact Note  . Lab Results  Component Value Date   AST 74* 10/30/2014   AST 57* 07/18/2014   AST 40* 09/20/2013   ALT 83* 10/30/2014   ALT 85* 07/18/2014   ALT 53 09/20/2013    Patient has asymptomatic mild elevation of transaminase levels.  Hepatitis B serology indicates the patient is immune due to natural infection; there was no evidence of hepatitis C.  Plan is obtain right upper quadrant ultrasound.  I called patient and discussed his lab results and the plans for ultrasound with him.

## 2014-11-08 ENCOUNTER — Other Ambulatory Visit: Payer: Self-pay | Admitting: *Deleted

## 2014-11-08 MED ORDER — HYDROCHLOROTHIAZIDE 25 MG PO TABS: 25.0000 mg | ORAL_TABLET | Freq: Every day | ORAL | Status: DC

## 2014-11-08 NOTE — Telephone Encounter (Signed)
The HCTZ is one 25 mg tablet daily.  I increased losartan to two of the 50 mg tablets daily and sent a new prescription for the losartan.

## 2014-11-08 NOTE — Telephone Encounter (Signed)
Pt's wife called and thought you increased HCTZ to 2 tabs daily from 1 1/2 daily. I reviewed your notes and told her you increased Losartan to 2 tablets daily.  No changes in HCTZ.   Is this correct? He needs refill on HCTZ.

## 2014-11-26 NOTE — Addendum Note (Signed)
Addended by: Hulan Fray on: 11/26/2014 11:47 AM   Modules accepted: Orders

## 2014-11-30 ENCOUNTER — Ambulatory Visit (HOSPITAL_COMMUNITY)
Admission: RE | Admit: 2014-11-30 | Discharge: 2014-11-30 | Disposition: A | Discharge status: Home / Self Care | Payer: Commercial Managed Care - HMO | Source: Ambulatory Visit | Attending: Internal Medicine | Admitting: Internal Medicine

## 2014-11-30 ENCOUNTER — Encounter: Payer: Self-pay | Admitting: Internal Medicine

## 2014-11-30 DIAGNOSIS — K8020 Calculus of gallbladder without cholecystitis without obstruction: Secondary | ICD-10-CM

## 2014-11-30 DIAGNOSIS — R740 Nonspecific elevation of levels of transaminase and lactic acid dehydrogenase [LDH]: Secondary | ICD-10-CM

## 2014-11-30 DIAGNOSIS — R7401 Elevation of levels of liver transaminase levels: Secondary | ICD-10-CM

## 2014-11-30 HISTORY — DX: Calculus of gallbladder without cholecystitis without obstruction: K80.20

## 2014-12-03 ENCOUNTER — Telehealth: Payer: Self-pay | Admitting: Internal Medicine

## 2014-12-03 NOTE — Telephone Encounter (Signed)
US Abdomen Limited Ruq  11/30/2014   CLINICAL DATA:  Increase transaminase level  EXAM: US ABDOMEN LIMITED - RIGHT UPPER QUADRANT  COMPARISON:  CT scan 12/02/2005  FINDINGS: Gallbladder:  Multiple gallstones are noted within gallbladder. The largest measures 6 mm. No thickening of gallbladder wall. No sonographic Murphy's sign  Common bile duct:  Diameter: 3 mm in diameter within normal limits.  Liver:  No focal hepatic mass. Diffuse increased echogenicity of the liver suspicious for fatty infiltration.  IMPRESSION: 1. Multiple gallstones are noted within gallbladder. No thickening of gallbladder wall. Normal CBD. 2. Diffuse increased echogenicity of the liver suspicious for fatty infiltration.   Electronically Signed   By: Lahoma Crocker M.D.   On: 11/30/2014 10:29    Telephone Contact Note  Abdominal ultrasound showed multiple gallstones within the gallbladder which are asymptomatic, and diffuse increased echogenicity of the liver suspicious for fatty infiltration.  I called patient and discussed these findings with him and, at his request, with his wife.  I went over the symptoms of gallstone disease, and advised him to come in right away if he develops any symptoms in the future.  I also advised him that his mildly elevated transaminases should be rechecked at the time of his next visit.  I advised him to make an appointment within the next month for follow-up of his hypertension and his liver enzymes.

## 2014-12-03 NOTE — Progress Notes (Signed)
Quick Note:  I called patient and discussed results; see telephone note 12/03/2014. ______

## 2015-01-31 ENCOUNTER — Other Ambulatory Visit: Payer: Self-pay | Admitting: Urology

## 2015-01-31 DIAGNOSIS — C61 Malignant neoplasm of prostate: Secondary | ICD-10-CM

## 2015-03-04 ENCOUNTER — Other Ambulatory Visit: Payer: Self-pay | Admitting: Internal Medicine

## 2015-03-18 ENCOUNTER — Telehealth: Payer: Self-pay | Admitting: *Deleted

## 2015-03-18 NOTE — Telephone Encounter (Signed)
WIFE CALLED REQUESTING UROLOGY REFERRAL FOR HUSBAND. HAS APPOINTMENT NEXT WEEK. DR Software engineer IS ON PATIENT'S INSURANCE (PER WIFE)/ REFERRAL SENT TO DR Software engineer.

## 2015-03-21 ENCOUNTER — Ambulatory Visit (HOSPITAL_COMMUNITY)
Admission: RE | Admit: 2015-03-21 | Discharge: 2015-03-21 | Disposition: A | Discharge status: Home / Self Care | Payer: Commercial Managed Care - HMO | Source: Ambulatory Visit | Attending: Urology | Admitting: Urology

## 2015-03-21 DIAGNOSIS — M25562 Pain in left knee: Secondary | ICD-10-CM | POA: Diagnosis not present

## 2015-03-21 DIAGNOSIS — C61 Malignant neoplasm of prostate: Secondary | ICD-10-CM | POA: Diagnosis present

## 2015-03-21 MED ORDER — TECHNETIUM TC 99M MEDRONATE IV KIT
27.0000 | PACK | Freq: Once | INTRAVENOUS | Status: AC | PRN
  Administered 2015-03-21: 27 via INTRAVENOUS

## 2015-03-27 ENCOUNTER — Telehealth: Payer: Self-pay | Admitting: Student in an Organized Health Care Education/Training Program

## 2015-03-27 DIAGNOSIS — C61 Malignant neoplasm of prostate: Secondary | ICD-10-CM

## 2015-03-27 NOTE — Telephone Encounter (Signed)
-----   Message from [place] sent at 03/27/2015 12:16 PM EDT ----- DR Evette Doffing SORRY FOR GETTING THIS TO YOU A LIL LATE. PATIENT NEEDS RE[facility]ERRAL [facility]. HAS APPOINTMENT THIS COMING Thursday. I HAD SENT THIS TO DR BUTCHER, BUT ARE THE PCP. PATIENT ALSO WILL NEED TO COME IN TO CLINIC TO SEE YOU.  IF YOU HAVE ANY QUESTIONS PLEASE CALL ME . LELA. SORRY !!! [place]

## 2015-03-28 ENCOUNTER — Telehealth: Payer: Self-pay | Admitting: Student in an Organized Health Care Education/Training Program

## 2015-03-28 DIAGNOSIS — C61 Malignant neoplasm of prostate: Secondary | ICD-10-CM

## 2015-03-28 NOTE — Addendum Note (Signed)
Addended by: Hulan Fray on: 03/28/2015 04:40 PM   Modules accepted: Orders

## 2015-03-28 NOTE — Telephone Encounter (Signed)
-----   Message from [place] sent at 03/28/2015 10:02 AM EDT ----- Regarding: CORRECTION ON REFERRAL DR [name], AGAIN, I'M SORRY, REFERRAL NEEDING TO BE UROLOGY! WITH DR Harlen Labs AT [facility]. [place]

## 2015-04-01 ENCOUNTER — Other Ambulatory Visit: Payer: Self-pay | Admitting: Urology

## 2015-04-04 NOTE — Patient Instructions (Signed)
[name]  04/04/2015   Your procedure is scheduled on: 04/08/2015    Report to [facility] Main  Entrance take [place]  elevators to 3rd floor to  [place] at    [place] AM.  Call this number if you have problems the morning of surgery [phone]   Remember: ONLY 1 PERSON MAY GO WITH YOU TO SHORT STAY TO GET  READY MORNING OF YOUR SURGERY.  Do not eat food or drink liquids :After Midnight.     Take these medicines the morning of surgery with A SIP OF WATER: none                                You may not have any metal on your body including hair pins and              piercings  Do not wear jewelry, , lotions, powders or perfumes, deodorant                        Men may shave face and neck.   Do not bring valuables to the hospital. [place].  Contacts, dentures or bridgework may not be worn into surgery.       Patients discharged the day of surgery will not be allowed to drive home.  Name and phone number of your driver:  Special Instructions: coughing and deep breathing exercises, leg exercises               Please read over the following fact sheets you were given: _____________________________________________________________________             [facility] - Preparing for Surgery Before surgery, you can play an important role.  Because skin is not sterile, your skin needs to be as free of germs as possible.  You can reduce the number of germs on your skin by washing with CHG (chlorahexidine gluconate) soap before surgery.  CHG is an antiseptic cleaner which kills germs and bonds with the skin to continue killing germs even after washing. Please DO NOT use if you have an allergy to CHG or antibacterial soaps.  If your skin becomes reddened/irritated stop using the CHG and inform your nurse when you arrive at Short Stay. Do not shave (including legs and underarms) for at least 48 hours prior to  the first CHG shower.  You may shave your face/neck. Please follow these instructions carefully:  1.  Shower with CHG Soap the night before surgery and the  morning of Surgery.  2.  If you choose to wash your hair, wash your hair first as usual with your  normal  shampoo.  3.  After you shampoo, rinse your hair and body thoroughly to remove the  shampoo.                           4.  Use CHG as you would any other liquid soap.  You can apply chg directly  to the skin and wash                       Gently with a scrungie or clean washcloth.  5.  Apply the CHG Soap to your body ONLY FROM THE NECK DOWN.   Do not use on face/ open                           Wound or open sores. Avoid contact with eyes, ears mouth and genitals (private parts).                       Wash face,  Genitals (private parts) with your normal soap.             6.  Wash thoroughly, paying special attention to the area where your surgery  will be performed.  7.  Thoroughly rinse your body with warm water from the neck down.  8.  DO NOT shower/wash with your normal soap after using and rinsing off  the CHG Soap.                9.  Pat yourself dry with a clean towel.            10.  Wear clean pajamas.            11.  Place clean sheets on your bed the night of your first shower and do not  sleep with pets. Day of Surgery : Do not apply any lotions/deodorants the morning of surgery.  Please wear clean clothes to the hospital/surgery center.  FAILURE TO FOLLOW THESE INSTRUCTIONS MAY RESULT IN THE CANCELLATION OF YOUR SURGERY PATIENT SIGNATURE_________________________________  NURSE SIGNATURE__________________________________  ________________________________________________________________________

## 2015-04-05 ENCOUNTER — Encounter (HOSPITAL_COMMUNITY): Payer: Self-pay

## 2015-04-05 ENCOUNTER — Encounter (HOSPITAL_COMMUNITY)
Admission: RE | Admit: 2015-04-05 | Discharge: 2015-04-05 | Disposition: A | Discharge status: Home / Self Care | Payer: Commercial Managed Care - HMO | Source: Ambulatory Visit | Attending: Urology | Admitting: Urology

## 2015-04-05 DIAGNOSIS — M1990 Unspecified osteoarthritis, unspecified site: Secondary | ICD-10-CM | POA: Diagnosis not present

## 2015-04-05 DIAGNOSIS — Z6835 Body mass index (BMI) 35.0-35.9, adult: Secondary | ICD-10-CM | POA: Diagnosis not present

## 2015-04-05 DIAGNOSIS — N481 Balanitis: Secondary | ICD-10-CM | POA: Diagnosis not present

## 2015-04-05 DIAGNOSIS — N471 Phimosis: Secondary | ICD-10-CM | POA: Diagnosis not present

## 2015-04-05 DIAGNOSIS — Z8551 Personal history of malignant neoplasm of bladder: Secondary | ICD-10-CM | POA: Diagnosis not present

## 2015-04-05 DIAGNOSIS — N359 Urethral stricture, unspecified: Secondary | ICD-10-CM | POA: Diagnosis not present

## 2015-04-05 DIAGNOSIS — I10 Essential (primary) hypertension: Secondary | ICD-10-CM | POA: Diagnosis not present

## 2015-04-05 DIAGNOSIS — Z8546 Personal history of malignant neoplasm of prostate: Secondary | ICD-10-CM | POA: Diagnosis not present

## 2015-04-05 DIAGNOSIS — G4733 Obstructive sleep apnea (adult) (pediatric): Secondary | ICD-10-CM | POA: Diagnosis not present

## 2015-04-05 DIAGNOSIS — Z87891 Personal history of nicotine dependence: Secondary | ICD-10-CM | POA: Diagnosis not present

## 2015-04-05 LAB — CBC
HEMATOCRIT: 38.7 % — AB (ref 39.0–52.0)
Hemoglobin: 13.1 g/dL (ref 13.0–17.0)
MCH: 31.1 pg (ref 26.0–34.0)
MCHC: 33.9 g/dL (ref 30.0–36.0)
MCV: 91.9 fL (ref 78.0–100.0)
Platelets: 156 10*3/uL (ref 150–400)
RBC: 4.21 MIL/uL — ABNORMAL LOW (ref 4.22–5.81)
RDW: 13.5 % (ref 11.5–15.5)
WBC: 5.1 10*3/uL (ref 4.0–10.5)

## 2015-04-05 LAB — BASIC METABOLIC PANEL
Anion gap: 8 (ref 5–15)
BUN: 17 mg/dL (ref 6–20)
CALCIUM: 9.9 mg/dL (ref 8.9–10.3)
CO2: 27 mmol/L (ref 22–32)
CREATININE: 0.95 mg/dL (ref 0.61–1.24)
Chloride: 107 mmol/L (ref 101–111)
GFR calc non Af Amer: >60 mL/min (ref >60)
GLUCOSE: 94 mg/dL (ref 65–99)
Potassium: 3.9 mmol/L (ref 3.5–5.1)
Sodium: 142 mmol/L (ref 135–145)

## 2015-04-05 NOTE — Progress Notes (Addendum)
10-30-14- LOV - Dr. Marinda Elk (int.med.) - EPIC 2012 - Stress Test - EPIC 10-16-12 - EKG - EPIC

## 2015-04-08 ENCOUNTER — Encounter (HOSPITAL_COMMUNITY): Payer: Self-pay | Admitting: *Deleted

## 2015-04-08 ENCOUNTER — Ambulatory Visit (HOSPITAL_COMMUNITY): Payer: Commercial Managed Care - HMO

## 2015-04-08 ENCOUNTER — Ambulatory Visit (HOSPITAL_COMMUNITY): Payer: Commercial Managed Care - HMO | Admitting: Certified Registered Nurse Anesthetist

## 2015-04-08 ENCOUNTER — Ambulatory Visit (HOSPITAL_COMMUNITY)
Admission: RE | Admit: 2015-04-08 | Discharge: 2015-04-08 | Disposition: A | Discharge status: Home / Self Care | Payer: Commercial Managed Care - HMO | Source: Ambulatory Visit | Attending: Urology | Admitting: Urology

## 2015-04-08 ENCOUNTER — Encounter (HOSPITAL_COMMUNITY)
Admission: RE | Disposition: A | Discharge status: Home / Self Care | Payer: Self-pay | Source: Ambulatory Visit | Attending: Urology

## 2015-04-08 DIAGNOSIS — N471 Phimosis: Secondary | ICD-10-CM | POA: Diagnosis not present

## 2015-04-08 DIAGNOSIS — Z8551 Personal history of malignant neoplasm of bladder: Secondary | ICD-10-CM | POA: Insufficient documentation

## 2015-04-08 DIAGNOSIS — N99111 Postprocedural bulbous urethral stricture: Secondary | ICD-10-CM

## 2015-04-08 DIAGNOSIS — N359 Urethral stricture, unspecified: Secondary | ICD-10-CM | POA: Insufficient documentation

## 2015-04-08 DIAGNOSIS — Z8546 Personal history of malignant neoplasm of prostate: Secondary | ICD-10-CM | POA: Insufficient documentation

## 2015-04-08 DIAGNOSIS — I10 Essential (primary) hypertension: Secondary | ICD-10-CM | POA: Insufficient documentation

## 2015-04-08 DIAGNOSIS — G4733 Obstructive sleep apnea (adult) (pediatric): Secondary | ICD-10-CM | POA: Insufficient documentation

## 2015-04-08 DIAGNOSIS — Z6835 Body mass index (BMI) 35.0-35.9, adult: Secondary | ICD-10-CM | POA: Insufficient documentation

## 2015-04-08 DIAGNOSIS — Z87891 Personal history of nicotine dependence: Secondary | ICD-10-CM | POA: Insufficient documentation

## 2015-04-08 DIAGNOSIS — N481 Balanitis: Secondary | ICD-10-CM | POA: Insufficient documentation

## 2015-04-08 DIAGNOSIS — M1990 Unspecified osteoarthritis, unspecified site: Secondary | ICD-10-CM | POA: Insufficient documentation

## 2015-04-08 HISTORY — PX: CIRCUMCISION: SHX1350

## 2015-04-08 HISTORY — PX: CYSTOSCOPY WITH URETHRAL DILATATION: SHX5125

## 2015-04-08 SURGERY — CYSTOSCOPY, WITH URETHRAL DILATION
Anesthesia: General

## 2015-04-08 MED ORDER — CEFAZOLIN SODIUM-DEXTROSE 2-3 GM-% IV SOLR
INTRAVENOUS | Status: AC
  Filled 2015-04-08: qty 50

## 2015-04-08 MED ORDER — DIPHENHYDRAMINE HCL 50 MG/ML IJ SOLN
INTRAMUSCULAR | Status: AC
  Filled 2015-04-08: qty 1

## 2015-04-08 MED ORDER — ONDANSETRON HCL 4 MG/2ML IJ SOLN
INTRAMUSCULAR | Status: DC | PRN
  Administered 2015-04-08: 4 mg via INTRAVENOUS

## 2015-04-08 MED ORDER — LACTATED RINGERS IV SOLN
INTRAVENOUS | Status: DC
  Administered 2015-04-08: 1000 mL via INTRAVENOUS

## 2015-04-08 MED ORDER — PROMETHAZINE HCL 25 MG/ML IJ SOLN: 6.2500 mg | INTRAMUSCULAR | Status: DC | PRN

## 2015-04-08 MED ORDER — PROPOFOL 10 MG/ML IV BOLUS
INTRAVENOUS | Status: AC
  Filled 2015-04-08: qty 20

## 2015-04-08 MED ORDER — FENTANYL CITRATE (PF) 250 MCG/5ML IJ SOLN
INTRAMUSCULAR | Status: AC
Start: 2015-04-08 — End: 2015-04-08
  Filled 2015-04-08: qty 25

## 2015-04-08 MED ORDER — HYDROMORPHONE HCL 1 MG/ML IJ SOLN: 0.2500 mg | INTRAMUSCULAR | Status: DC | PRN

## 2015-04-08 MED ORDER — ONDANSETRON HCL 4 MG/2ML IJ SOLN
INTRAMUSCULAR | Status: AC
  Filled 2015-04-08: qty 2

## 2015-04-08 MED ORDER — DEXAMETHASONE SODIUM PHOSPHATE 10 MG/ML IJ SOLN
INTRAMUSCULAR | Status: DC | PRN
  Administered 2015-04-08: 10 mg via INTRAVENOUS

## 2015-04-08 MED ORDER — SODIUM CHLORIDE 0.9 % IR SOLN
Status: DC | PRN
  Administered 2015-04-08: 3000 mL via INTRAVESICAL

## 2015-04-08 MED ORDER — BUPIVACAINE HCL (PF) 0.25 % IJ SOLN
INTRAMUSCULAR | Status: AC
  Filled 2015-04-08: qty 30

## 2015-04-08 MED ORDER — BUPIVACAINE HCL (PF) 0.25 % IJ SOLN
INTRAMUSCULAR | Status: DC | PRN
  Administered 2015-04-08: 10 mL

## 2015-04-08 MED ORDER — LIDOCAINE HCL (CARDIAC) 20 MG/ML IV SOLN
INTRAVENOUS | Status: DC | PRN
  Administered 2015-04-08: 100 mg via INTRAVENOUS

## 2015-04-08 MED ORDER — HYDROCODONE-ACETAMINOPHEN 5-325 MG PO TABS
1.0000 | ORAL_TABLET | ORAL | Status: DC | PRN
  Administered 2015-04-08: 1 via ORAL
  Filled 2015-04-08: qty 1

## 2015-04-08 MED ORDER — CEFAZOLIN SODIUM-DEXTROSE 2-3 GM-% IV SOLR
2.0000 g | INTRAVENOUS | Status: AC
  Administered 2015-04-08: 2 g via INTRAVENOUS

## 2015-04-08 MED ORDER — HYDROCODONE-ACETAMINOPHEN 5-325 MG PO TABS: 1.0000 | ORAL_TABLET | ORAL | Status: DC | PRN

## 2015-04-08 MED ORDER — PHENYLEPHRINE HCL 10 MG/ML IJ SOLN
INTRAMUSCULAR | Status: DC | PRN
  Administered 2015-04-08: 40 ug via INTRAVENOUS

## 2015-04-08 MED ORDER — DIPHENHYDRAMINE HCL 50 MG/ML IJ SOLN
INTRAMUSCULAR | Status: DC | PRN
  Administered 2015-04-08: 12.5 mg via INTRAVENOUS

## 2015-04-08 MED ORDER — IOHEXOL 300 MG/ML  SOLN
INTRAMUSCULAR | Status: DC | PRN
  Administered 2015-04-08: 10 mL

## 2015-04-08 MED ORDER — LIDOCAINE HCL (CARDIAC) 20 MG/ML IV SOLN
INTRAVENOUS | Status: AC
  Filled 2015-04-08: qty 5

## 2015-04-08 MED ORDER — PHENYLEPHRINE 40 MCG/ML (10ML) SYRINGE FOR IV PUSH (FOR BLOOD PRESSURE SUPPORT)
PREFILLED_SYRINGE | INTRAVENOUS | Status: AC
  Filled 2015-04-08: qty 10

## 2015-04-08 MED ORDER — EPHEDRINE SULFATE 50 MG/ML IJ SOLN
INTRAMUSCULAR | Status: AC
  Filled 2015-04-08: qty 1

## 2015-04-08 MED ORDER — FENTANYL CITRATE (PF) 100 MCG/2ML IJ SOLN
INTRAMUSCULAR | Status: DC | PRN
  Administered 2015-04-08 (×3): 50 ug via INTRAVENOUS

## 2015-04-08 MED ORDER — PROPOFOL 10 MG/ML IV BOLUS
INTRAVENOUS | Status: DC | PRN
  Administered 2015-04-08: 200 mg via INTRAVENOUS
  Administered 2015-04-08: 40 mg via INTRAVENOUS

## 2015-04-08 MED ORDER — CEPHALEXIN 500 MG PO CAPS
500.0000 mg | ORAL_CAPSULE | Freq: Two times a day (BID) | ORAL | Status: DC
Start: 2015-04-08 — End: 2015-05-20

## 2015-04-08 MED ORDER — SODIUM CHLORIDE 0.9 % IJ SOLN
INTRAMUSCULAR | Status: AC
  Filled 2015-04-08: qty 10

## 2015-04-08 SURGICAL SUPPLY — 25 items
BALLN NEPHROSTOMY (BALLOONS) ×2
BALLOON NEPHROSTOMY (BALLOONS) IMPLANT
BLADE SURG 15 STRL LF DISP TIS (BLADE) ×1 IMPLANT
BLADE SURG 15 STRL SS (BLADE) ×2
BNDG COHESIVE 1X5 TAN STRL LF (GAUZE/BANDAGES/DRESSINGS) ×2 IMPLANT
BNDG CONFORM 2 STRL LF (GAUZE/BANDAGES/DRESSINGS) ×2 IMPLANT
COVER SURGICAL LIGHT HANDLE (MISCELLANEOUS) ×2 IMPLANT
DRAPE LAPAROTOMY T 102X78X121 (DRAPES) ×1 IMPLANT
ELECT NDL TIP 2.8 STRL (NEEDLE) IMPLANT
ELECT NEEDLE TIP 2.8 STRL (NEEDLE) IMPLANT
ELECT PENCIL ROCKER SW 15FT (MISCELLANEOUS) ×2 IMPLANT
ELECT REM PT RETURN 9FT ADLT (ELECTROSURGICAL) ×2
ELECTRODE REM PT RTRN 9FT ADLT (ELECTROSURGICAL) ×1 IMPLANT
GAUZE PETROLATUM 1 X8 (GAUZE/BANDAGES/DRESSINGS) ×2 IMPLANT
GAUZE SPONGE 4X4 16PLY XRAY LF (GAUZE/BANDAGES/DRESSINGS) ×2 IMPLANT
GLOVE BIOGEL M 8.0 STRL (GLOVE) ×4 IMPLANT
GOWN STRL REUS W/TWL XL LVL3 (GOWN DISPOSABLE) ×3 IMPLANT
GUIDEWIRE STR DUAL SENSOR (WIRE) ×1 IMPLANT
KIT BASIN OR (CUSTOM PROCEDURE TRAY) ×2 IMPLANT
NEEDLE HYPO 22GX1.5 SAFETY (NEEDLE) ×1 IMPLANT
NS IRRIG 1000ML POUR BTL (IV SOLUTION) ×1 IMPLANT
PACK BASIC VI WITH GOWN DISP (CUSTOM PROCEDURE TRAY) ×1 IMPLANT
PACK CYSTO (CUSTOM PROCEDURE TRAY) ×1 IMPLANT
SUT CHROMIC 4 0 RB 1X27 (SUTURE) ×4 IMPLANT
SYR CONTROL 10ML LL (SYRINGE) ×1 IMPLANT

## 2015-04-08 NOTE — Transfer of Care (Signed)
Immediate Anesthesia Transfer of Care Note  Patient: [name]  Procedure(s) Performed: Procedure(s): CYSTOSCOPY WITH BALLOON DILATATION OF BLADDER NECK CONTRACTURE (N/A) CIRCUMCISION ADULT (N/A)  Patient Location: PACU  Anesthesia Type:General  Level of Consciousness:  sedated, patient cooperative and responds to stimulation  Airway & Oxygen Therapy:Patient Spontanous Breathing and Patient connected to face mask oxgen  Post-op Assessment:  Report given to PACU RN and Post -op Vital signs reviewed and stable  Post vital signs:  Reviewed and stable  Last Vitals:  Filed Vitals:   04/08/15 1033  BP:   Pulse:   Temp:   Resp: 11    Complications: No apparent anesthesia complications

## 2015-04-08 NOTE — Op Note (Signed)
Preoperative diagnosis: Phimosis, BNC Postoperative diagnosis: Same  Procedure: Circumcision, cystoscopy, balloon dilation of urethral stricture Surgeon: Lillette Boxer. Meshach Perry, MD. Anesthesia: Gen. with penile block(20 cc 0.25% marcaine) Specimen: foreskin Complications: none [id number]  Indications: The patient was recently evaluated for phimosis. All risks and benefits of circumcision discussed. Full informed consent obtained. The patient now presents for definitive procedure.  Technique and findings: The patient was brought to the operating room. Successful induction of general anesthesia.  The patient was then prepped and draped in usual manner. Appropriate surgical timeout was performed.  A 23 French panendoscope was advanced under direct vision through his urethra. A very dense bladder neck contracture was seen. This was passed with a 0.038 inch sensor-tip guidewire. Fluoroscopically a curl a guidewire was seen within the bladder. The scope was removed. I then passed a 17 [place] NephroMax balloon over top of the guidewire. With some significant pressure, this was eventually advanced past the stricture with the distal tip of the balloon seen in the bladder. I then inflated the balloon to 20 atm of pressure for 5 minutes. The balloon was then deflated and removed. I then passed the 23 French panendoscope through the stricture which was easily passed and the bladder was inspected circumferentially. No tumors trabeculations or foreign bodies were noted. Ureteral orifices were normal. The scope was then removed. Over top of the guidewire then placed an 18 French Foley catheter.   A penile block was then performed with 20cc of quarter percent plain Marcaine. Proximal and distal incision sites were marked with a pen, and appropriate circumferential incisions were created. The sleeve of redundant skin was removed with the bovie. Hemostatis was achieved using electrocautery.Quadrant sutures were placed  with interrupted 4-0 chromic, with the phrenular suture being a "U" stitch. In between, the same 4-0 chromic was used to re approximate the skin edges with a running simple stitch.  The incision was wrapped with Xeroform gauze, a plain guaze wrap and Coban dressing. The patient was brought to recovery room in stable condition having had no obvious complications or problems. Sponge and needle counts were correct.

## 2015-04-08 NOTE — Discharge Instructions (Signed)
Postoperative instructions for circumcision  Wound:  Remove the dressing the morning after surgery. In most cases your incision will have absorbable sutures that run along the course of your incision and will dissolve within the first 10-20 days. Some will fall out even earlier. Expect some redness as the sutures dissolved but this should occur only around the sutures. If there is generalized redness, especially with increasing pain or swelling, let us know. The penis will possibly get "black and blue" as the blood in the tissues spread. Sometimes the whole penis will turn colors. The black and blue is followed by a yellow and brown color. In time, all the discoloration will go away.  Diet:  You may return to your normal diet within 24 hours following your surgery. You may note some mild nausea and possibly vomiting the first 6-8 hours following surgery. This is usually due to the side effects of anesthesia, and will disappear quite soon. I would suggest clear liquids and a very light meal the first evening following your surgery.  Activity:  Your physical activity should be restricted the first 48 hours. During that time you should remain relatively inactive, moving about only when necessary. During the first 7-10 days following surgery he should avoid lifting any heavy objects (anything greater than 15 pounds), and avoid strenuous exercise. If you work, ask [place] specifically about your restrictions, both for work and home. We will write a note to your employer if needed.  Ice packs can be placed on and off over the penis for the first 48 hours to help relieve the pain and keep the swelling down. Frozen peas or corn in a ZipLock bag can be frozen, used and re-frozen. Fifteen minutes on and 15 minutes off is a reasonable schedule.  No sexual activity for 1 month.  Hygiene:  You may shower 24 hours after your surgery. Make sure wound is clean and dry afterwards. Tub bathing should be restricted  until the seventh day.  Medication:  You will be sent home with some type of pain medication. In many cases you will be sent home with a narcotic pain pill (Vicodin or Tylox). If the pain is not too bad, you may take either Tylenol (acetaminophen) or Advil (ibuprofen) which contain no narcotic agents, and might be tolerated a little better, with fewer side effects. If the pain medication you are sent home with does not control the pain, you will have to let us know. Some narcotic pain medications cannot be given or refilled by a phone call to a pharmacy.  Problems you should report to [place]:   Fever of 101.0 degrees Fahrenheit or greater.  Moderate or severe swelling under the skin incision or involving the scrotum.  Drug reaction such as hives, a rash, nausea or vomiting.  Difficulty voidingYou may see some blood in the urine and may have some burning with urination for 48-72 hours. You also may notice that you have to urinate more frequently or urgently after your procedure which is normal.  You should call should you develop an inability urinate, fever > 101, persistent nausea and vomiting that prevents you from eating or drinking to stay hydrated.  If you have a stent, you will likely urinate more frequently and urgently until the stent is removed and you may experience some discomfort/pain in the lower abdomen and flank especially when urinating. You may take pain medication prescribed to you if needed for pain. You may also intermittently have blood in the urine until  the stent is removed. If you have a catheter, you will be taught how to take care of the catheter by the nursing staff prior to discharge from the hospital. OK to remove catheter on Tues morning. You may periodically feel a strong urge to void with the catheter in place.  This is a bladder spasm and most often can occur when having a bowel movement or moving around. It is typically self-limited and usually will stop after a few  minutes.  You may use some Vaseline or Neosporin around the tip of the catheter to reduce friction at the tip of the penis. You may also see some blood in the urine.  A very small amount of blood can make the urine look quite red.  As long as the catheter is draining well, there usually is not a problem.  However, if the catheter is not draining well and is bloody, you should call the office [phone]) to notify us.           General Anesthesia, Care After Refer to this sheet in the next few weeks. These instructions provide you with information on caring for yourself after your procedure. Your health care provider may also give you more specific instructions. Your treatment has been planned according to current medical practices, but problems sometimes occur. Call your health care provider if you have any problems or questions after your procedure. WHAT TO EXPECT AFTER THE PROCEDURE After the procedure, it is typical to experience:  Sleepiness.  Nausea and vomiting. HOME CARE INSTRUCTIONS  For the first 24 hours after general anesthesia:  Have a responsible person with you.  Do not drive a car. If you are alone, do not take public transportation.  Do not drink alcohol.  Do not take medicine that has not been prescribed by your health care provider.  Do not sign important papers or make important decisions.  You may resume a normal diet and activities as directed by your health care provider.  Change bandages (dressings) as directed.  If you have questions or problems that seem related to general anesthesia, call the hospital and ask for the anesthetist or anesthesiologist on call. SEEK MEDICAL CARE IF:  You have nausea and vomiting that continue the day after anesthesia.  You develop a rash. SEEK IMMEDIATE MEDICAL CARE IF:   You have difficulty breathing.  You have chest pain.  You have any allergic problems. Document Released: 11/02/2000 Document Revised:  08/01/2013 Document Reviewed: 02/09/2013 [facility] Patient Information 2015 [place], [place]. This information is not intended to replace advice given to you by your health care provider. Make sure you discuss any questions you have with your health care provider. Foley Catheter Care A Foley catheter is a soft, flexible tube. This tube is placed into your bladder to drain pee (urine). If you go home with this catheter in place, follow the instructions below. TAKING CARE OF THE CATHETER Wash your hands with soap and water. Put soap and water on a clean washcloth. Clean the skin where the tube goes into your body. Clean away from the tube site. Never wipe toward the tube. Clean the area using a circular motion. Remove all the soap. Pat the area dry with a clean towel. For males, reposition the skin that covers the end of the penis (foreskin). Attach the tube to your leg with tape or a leg strap. Do not stretch the tube tight. If you are using tape, remove any stickiness left behind by past tape you  used. Keep the drainage bag below your hips. Keep it off the floor. Check your tube during the day. Make sure it is working and draining. Make sure the tube does not curl, twist, or bend. Do not pull on the tube or try to take it out. TAKING CARE OF THE DRAINAGE BAGS You will have a large overnight drainage bag and a small leg bag. You may wear the overnight bag any time. Never wear the small bag at night. Follow the directions below. Emptying the Drainage Bag Empty your drainage bag when it is  - full or at least 2-3 times a day. Wash your hands with soap and water. Keep the drainage bag below your hips. Hold the dirty bag over the toilet or clean container. Open the pour spout at the bottom of the bag. Empty the pee into the toilet or container. Do not let the pour spout touch anything. Clean the pour spout with a gauze pad or cotton ball that has rubbing alcohol on it. Close the pour  spout. Attach the bag to your leg with tape or a leg strap. Wash your hands well. Changing the Drainage Bag Change your bag once a month or sooner if it starts to smell or look dirty.  Wash your hands with soap and water. Pinch the rubber tube so that pee does not spill out. Disconnect the catheter tube from the drainage tube at the connection valve. Do not let the tubes touch anything. Clean the end of the catheter tube with an alcohol wipe. Clean the end of a the drainage tube with a different alcohol wipe. Connect the catheter tube to the drainage tube of the clean drainage bag. Attach the new bag to the leg with tape or a leg strap. Avoid attaching the new bag too tightly. Wash your hands well. Cleaning the Drainage Bag Wash your hands with soap and water. Wash the bag in warm, soapy water. Rinse the bag with warm water. Fill the bag with a mixture of white vinegar and water (1 cup vinegar to 1 quart warm water [.2 liter vinegar to 1 liter warm water]). Close the bag and soak it for 30 minutes in the solution. Rinse the bag with warm water. Hang the bag to dry with the pour spout open and hanging downward. Store the clean bag (once it is dry) in a clean plastic bag. Wash your hands well. PREVENT INFECTION Wash your hands before and after touching your tube. Take showers every day. Wash the skin where the tube enters your body. Do not take baths. Replace wet leg straps with dry ones, if this applies. Do not use powders, sprays, or lotions on the genital area. Only use creams, lotions, or ointments as told by your doctor. For females, wipe from front to back after going to the bathroom. Drink enough fluids to keep your pee clear or pale yellow unless you are told not to have too much fluid (fluid restriction). Do not let the drainage bag or tubing touch or lie on the floor. Wear cotton underwear to keep the area dry. GET HELP IF: Your pee is cloudy or smells unusually bad. Your tube  becomes clogged. You are not draining pee into the bag or your bladder feels full. Your tube starts to leak. GET HELP RIGHT AWAY IF: You have pain, puffiness (swelling), redness, or yellowish-white fluid (pus) where the tube enters the body. You have pain in the belly (abdomen), legs, lower back, or bladder. You have a  fever. You see blood fill the tube, or your pee is pink or red. You feel sick to your stomach (nauseous), throw up (vomit), or have chills. Your tube gets pulled out. MAKE SURE YOU:  Understand these instructions. Will watch your condition. Will get help right away if you are not doing well or get worse. Document Released: 11/21/2012 Document Revised: 12/11/2013 Document Reviewed: 11/21/2012 [facility] Patient Information 2015 [place], [place]. This information is not intended to replace advice given to you by your health care provider. Make sure you discuss any questions you have with your health care provider.

## 2015-04-08 NOTE — Anesthesia Preprocedure Evaluation (Addendum)
Anesthesia Evaluation  Patient identified by MRN, date of birth, ID band Patient awake    Reviewed: Allergy & Precautions, NPO status , Patient's Chart, lab work & pertinent test results  History of Anesthesia Complications Negative for: history of anesthetic complications  Airway Mallampati: I  TM Distance: >3 FB Neck ROM: Full    Dental  (+) Partial Upper, Missing, Dental Advisory Given   Pulmonary sleep apnea and Continuous Positive Airway Pressure Ventilation , former smoker,    Pulmonary exam normal       Cardiovascular hypertension, Normal cardiovascular exam    Neuro/Psych negative neurological ROS  negative psych ROS   GI/Hepatic negative GI ROS, (+) Hepatitis -, Unspecified  Endo/Other  Morbid obesity  Renal/GU negative Renal ROS     Musculoskeletal   Abdominal   Peds  Hematology   Anesthesia Other Findings   Reproductive/Obstetrics                           Anesthesia Physical Anesthesia Plan  ASA: III  Anesthesia Plan: General   Post-op Pain Management:    Induction: Intravenous  Airway Management Planned: LMA  Additional Equipment:   Intra-op Plan:   Post-operative Plan: Extubation in OR  Informed Consent: I have reviewed the patients History and Physical, chart, labs and discussed the procedure including the risks, benefits and alternatives for the proposed anesthesia with the patient or authorized representative who has indicated his/her understanding and acceptance.   Dental advisory given  Plan Discussed with: CRNA, Anesthesiologist and Surgeon  Anesthesia Plan Comments:         Anesthesia Quick Evaluation

## 2015-04-08 NOTE — Anesthesia Postprocedure Evaluation (Signed)
Anesthesia Post Note  Patient: [name]  Procedure(s) Performed: Procedure(s) (LRB): CYSTOSCOPY WITH BALLOON DILATATION OF BLADDER NECK CONTRACTURE (N/A) CIRCUMCISION ADULT (N/A)  Anesthesia type: general  Patient location: PACU  Post pain: Pain level controlled  Post assessment: Patient's Cardiovascular Status Stable  Last Vitals:  Filed Vitals:   04/08/15 1100  BP: 178/76  Pulse: 50  Temp:   Resp: 13    Post vital signs: Reviewed and stable  Level of consciousness: sedated  Complications: No apparent anesthesia complications

## 2015-04-08 NOTE — H&P (Signed)
Urology History and Physical Exam  CC: Urethral stricture, bothersome penile/foreskin pain  HPI: 79 year old male with a h/o recurrent PCa, s/p XRT presents for treatment of a recurrent urethral stricture as well as a circumcision for treatment of mild phimosis and associated balanitis due to recurrent UUI.  PMH: Past Medical History  Diagnosis Date  . Hypertension   . Dysuria   . Urge incontinence   . Adenomatous colon polyp 12/14/2005    5 mm polyp in descending colon, found on colonoscopy by Dr. Wilford Corner  . Occult blood in stools   . Insomnia   . Allergic rhinitis   . ED (erectile dysfunction)   . History of prostate cancer     S/P EXTERNAL RADIATION, SEED IMPLANTS  2009  . History of bladder cancer     S/P TURBT 2010  . Bladder calculi   . Bladder neck contracture   . OSA on CPAP     Severe by diagnostic polysomnogram 06/22/2004.  Marland Kitchen Hepatitis, unspecified PT STATES HAD HEPATITIS APPROX 1980'S ; UNSURE WHAT TYPE    Hepatitis C RNA Quant on July 06, 2007 showed no detectable virus.  . Hand eczema   . Arthritis   . Nocturia   . Asymptomatic cholelithiasis 11/30/2014  . Shortness of breath dyspnea     with excertion  . Cancer     prostate    PSH: Past Surgical History  Procedure Laterality Date  . Cardiovascular stress test  07-14-2011  DR NISHAN    NORMAL NUCLEAR STUDY/ EF 62%  . Transurethral resection of bladder neck  12-19-2008  . Transurethral resection of bladder tumor  02-19-2009    AND TUR BLADDER NECK  . Radioactive prostate seed implants  04-11-2008  . Knee arthroscopy Bilateral 1970'S  . Cystoscopy with litholapaxy N/A 10/24/2012    Procedure: CYSTOSCOPY WITH LITHOLAPAXY;  Surgeon: Franchot Gallo, MD;  Location: [facility];  Service: Urology;  Laterality: N/A;  TRANSURETHRAL RESECTION OF BLADDER NECK CONTRACTURE WITH COLLINS KNIFE       Allergies: Allergies  Allergen Reactions  . Aspirin Other (See Comments)    GI  symptoms - nausea    Medications: No prescriptions prior to admission     Social History: Social History   Social History  . Marital Status: Married    Spouse Name: N/A  . Number of Children: N/A  . Years of Education: N/A   Occupational History  . Not on file.   Social History Main Topics  . Smoking status: Former Smoker -- 0.25 packs/day for 25 years    Types: Cigarettes    Quit date: 11/25/2004  . Smokeless tobacco: Never Used  . Alcohol Use: No  . Drug Use: No  . Sexual Activity: Not on file   Other Topics Concern  . Not on file   Social History Narrative    Family History: Family History  Problem Relation Age of Onset  . Cancer Neg Hx   . Diabetes Neg Hx   . Heart attack Mother 30    Review of Systems: Positive: LUTS, UUI Negative:  A further 10 point review of systems was negative except what is listed in the HPI.                  Physical Exam: @VITALS2 @ General: No acute distress.  Awake. Head:  Normocephalic.  Atraumatic. ENT:  EOMI.  Mucous membranes moist Neck:  Supple.  No lymphadenopathy. CV:  S1 present. S2 present. Regular  rate. Pulmonary: Equal effort bilaterally.  Clear to auscultation bilaterally. Abdomen: Soft.  Non tender to palpation. Skin:  Normal turgor.  No visible rash. Extremity: No gross deformity of bilateral upper extremities.  No gross deformity of                             lower extremities. Neurologic: Alert. Appropriate mood.  Penis:  Uncircumcised.  Phimosis/balanitis Urethra: Orthotopic meatus. Scrotum: No lesions.  No ecchymosis.  No erythema. Testicles: Descended bilaterally.  No masses bilaterally. Epididymis: Palpable bilaterally. Nontender to palpation.  Studies:  Recent Labs     04/05/15  0835  HGB  13.1  WBC  5.1  PLT  156    Recent Labs     04/05/15  0835  NA  142  K  3.9  CL  107  CO2  27  BUN  17  CREATININE  0.95  CALCIUM  9.9  GFRNONAA  >60  GFRAA  >60     No results for  input(s): INR, APTT in the last 72 hours.  Invalid input(s): PT   Invalid input(s): ABG    Assessment:  Urethral stricture, phimosis/balanitis  Plan: Urethrotomy, circumcision

## 2015-04-08 NOTE — Anesthesia Procedure Notes (Signed)
Procedure Name: LMA Insertion Date/Time: 04/08/2015 9:20 AM Performed by: Montel Clock Pre-anesthesia Checklist: Patient identified, Emergency Drugs available, Suction available, Patient being monitored and Timeout performed Patient Re-evaluated:Patient Re-evaluated prior to inductionOxygen Delivery Method: Circle system utilized Preoxygenation: Pre-oxygenation with 100% oxygen Intubation Type: IV induction Ventilation: Mask ventilation without difficulty LMA: LMA with gastric port inserted LMA Size: 4.0 Number of attempts: 1 Dental Injury: Teeth and Oropharynx as per pre-operative assessment

## 2015-04-09 ENCOUNTER — Encounter (HOSPITAL_COMMUNITY): Payer: Self-pay | Admitting: Urology

## 2015-05-20 ENCOUNTER — Ambulatory Visit (INDEPENDENT_AMBULATORY_CARE_PROVIDER_SITE_OTHER): Payer: Commercial Managed Care - HMO | Admitting: Student in an Organized Health Care Education/Training Program

## 2015-05-20 ENCOUNTER — Encounter: Payer: Self-pay | Admitting: Student in an Organized Health Care Education/Training Program

## 2015-05-20 VITALS — BP 167/83 | HR 66 | Temp 97.0°F | Wt 246.4 lb

## 2015-05-20 DIAGNOSIS — I10 Essential (primary) hypertension: Secondary | ICD-10-CM | POA: Diagnosis not present

## 2015-05-20 DIAGNOSIS — C7951 Secondary malignant neoplasm of bone: Secondary | ICD-10-CM

## 2015-05-20 DIAGNOSIS — C61 Malignant neoplasm of prostate: Secondary | ICD-10-CM

## 2015-05-20 DIAGNOSIS — M1712 Unilateral primary osteoarthritis, left knee: Secondary | ICD-10-CM

## 2015-05-20 DIAGNOSIS — E785 Hyperlipidemia, unspecified: Secondary | ICD-10-CM | POA: Diagnosis not present

## 2015-05-20 DIAGNOSIS — Z23 Encounter for immunization: Secondary | ICD-10-CM | POA: Diagnosis not present

## 2015-05-20 DIAGNOSIS — J4520 Mild intermittent asthma, uncomplicated: Secondary | ICD-10-CM

## 2015-05-20 DIAGNOSIS — R062 Wheezing: Secondary | ICD-10-CM

## 2015-05-20 DIAGNOSIS — L309 Dermatitis, unspecified: Secondary | ICD-10-CM | POA: Diagnosis not present

## 2015-05-20 MED ORDER — AMLODIPINE BESYLATE 5 MG PO TABS: 5.0000 mg | ORAL_TABLET | Freq: Every day | ORAL | Status: DC

## 2015-05-20 MED ORDER — PRAVASTATIN SODIUM 20 MG PO TABS: 20.0000 mg | ORAL_TABLET | Freq: Every evening | ORAL | Status: DC

## 2015-05-20 NOTE — Assessment & Plan Note (Signed)
Symptomatically stable on the hands, new lesions in the left ear. Plan for topical steroids to both areas with triamcinolone for the hands and hydrocortisone for the outer ear. We can try steroid drops in the ear if his symptoms do not improve.

## 2015-05-20 NOTE — Assessment & Plan Note (Signed)
Left knee has findings of moderate osteoarthritis. Currently not impacting his functional status much, so I advised tylenol for anti-inflammatory effect. If his pain progresses, we can escalate pain medications to optimize functionality, consider steroid injections, or orthopedics referral.

## 2015-05-20 NOTE — Patient Instructions (Signed)
1. Your blood pressure is still too high. This puts you at higher risk for a heart attack or stroke over the next 10 years.   2. We want to lower your pressure by starting two new medicines. Amlodipine 5mg  one tablet once a day for your blood pressure. Pravastatin 20mg  one tablet once a day for your cholesterol. These will lower your risk.   3. Use tylenol as needed for your knee pain and let me know if it gets worse in the futre.

## 2015-05-20 NOTE — Assessment & Plan Note (Signed)
Currently doing well with androgen deprivation therapy. 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 Currently no pain in those areas. Recently had surgery to repair urethral stricture which went well. He has improved LUT symptoms. He will follow with Dr. Diona Fanti as necessary.

## 2015-05-20 NOTE — Assessment & Plan Note (Signed)
Intermittent dry cough for several years. Now much improved, had several days of nocturnal cough a few weeks ago that resolved. I doubt it is related to ARB. No spirometry in the past. The time course seems consistent with post-infectious or environmental reactive airway disease. If he has further symptoms, we will try albuterol and ICS empiric therapy with spirometry.

## 2015-05-20 NOTE — Assessment & Plan Note (Signed)
Based on lipids from 2015, his 10 year ASCVD risk is 32% and could be lowered to 10% with optimization or risk factors. We talked about the goals and risks of statin therapy for primary prevention of CVD. He has a very active life style, still works and is very mobile, despite his age I think he still has an excellent 10 year prognosis to see the benefits of risk reduction now. Plan to start pravastatin 20mg  daily now. He has tried aspirin daily in the past which causes dyspepsia if taken for prolonged period, so we decided not to use aspirin for primary prevention.

## 2015-05-20 NOTE — Progress Notes (Signed)
   See Encounters tab for problem-based medical decision making  __________________________________________________________  HPI:  79 year old man presents to the clinic today for follow-up of essential hypertension. He has had elevated blood pressures for several visits, last with Dr. Marinda Elk in March he increased losartan from 75 mg daily to 100 mg daily. Patient does not check his blood pressures at home. He reports doing very well symptomatically. He is enjoying a very full functional life. 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 Denies increased dyspnea on exertion or chest pain. Denies any presyncopal symptoms. No recent falls. Eating and drinking well. Weight is stable. Reports good compliance with his nutrition goals, including increased fruits and that vegetables. Reports good compliance with his medications including this morning.  Does endorse some increasing left knee pain. 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 He reports that he occasionally has dull aching pain in the left knee, especially when he rests after a long day. He says that it is not currently impacting his functionality, he can still walk around to complete a full day of work. He does not currently use any Tylenol or NSAIDs for the pain. Denies feeling unsteady on his feet. Also has occasional ankle pain.    __________________________________________________________  Problem List: Patient Active Problem List   Diagnosis Date Noted  . Hyperlipidemia 05/20/2015  . Reactive airway disease 04/13/2014  . Hand eczema 06/05/2009  . Prostate cancer metastatic to bone ([place]) 04/03/2009  . OBSTRUCTIVE SLEEP APNEA 07/06/2007  . Essential hypertension 07/09/2006  . Osteoarthritis of left knee 07/09/2006    Medications: Reconciled today in Epic __________________________________________________________  Physical Exam:  Vital Signs: Filed Vitals:   05/20/15  1015  BP: 167/83  Pulse: 66  Temp: 97 F (36.1 C)  TempSrc: Oral  Weight: 246 lb 6.4 oz (111.766 kg)  SpO2: 100%    Gen: Well appearing, NAD ENT: OP clear without erythema or exudate.  Neck: No cervical LAD, No thyromegaly or nodules, No JVD. CV: RRR, no murmurs Pulm: Normal effort, CTA throughout, no wheezing Abd: Soft, NT, ND. Ext: Warm, no edema, left knee with moderate crepitus, no joint laxity or instability, no locking, no tenderness with passive range of motion. Other joints are normal. Skin: No atypical appearing moles. No rashes

## 2015-05-20 NOTE — Assessment & Plan Note (Signed)
Blood pressure remains above goal despite good compliance with full dose ARB and thiazide. We talked about the benefits of better blood pressure control in reducing risk for CVD, which he wants. Plan to add amlodipine 5mg  daily to HCTZ 25mg  and Losartan 100mg . Follow up with me in 2 months. I advised that he check his BP at home and let me know if they are still elevated.

## 2015-05-22 ENCOUNTER — Other Ambulatory Visit: Payer: Self-pay | Admitting: Internal Medicine

## 2015-06-12 ENCOUNTER — Other Ambulatory Visit: Payer: Self-pay | Admitting: *Deleted

## 2015-06-13 MED ORDER — HYDROCHLOROTHIAZIDE 25 MG PO TABS: 25.0000 mg | ORAL_TABLET | Freq: Every day | ORAL | Status: DC

## 2015-07-03 ENCOUNTER — Other Ambulatory Visit: Payer: Self-pay | Admitting: Internal Medicine

## 2015-07-15 ENCOUNTER — Encounter: Payer: Self-pay | Admitting: Student in an Organized Health Care Education/Training Program

## 2015-07-15 ENCOUNTER — Ambulatory Visit (INDEPENDENT_AMBULATORY_CARE_PROVIDER_SITE_OTHER): Payer: Commercial Managed Care - HMO | Admitting: Student in an Organized Health Care Education/Training Program

## 2015-07-15 VITALS — BP 122/80 | HR 80

## 2015-07-15 DIAGNOSIS — E785 Hyperlipidemia, unspecified: Secondary | ICD-10-CM

## 2015-07-15 DIAGNOSIS — I10 Essential (primary) hypertension: Secondary | ICD-10-CM | POA: Diagnosis not present

## 2015-07-15 DIAGNOSIS — R062 Wheezing: Secondary | ICD-10-CM

## 2015-07-15 DIAGNOSIS — L309 Dermatitis, unspecified: Secondary | ICD-10-CM | POA: Diagnosis not present

## 2015-07-15 DIAGNOSIS — M1712 Unilateral primary osteoarthritis, left knee: Secondary | ICD-10-CM

## 2015-07-15 MED ORDER — OMEPRAZOLE 40 MG PO CPDR: 40.0000 mg | DELAYED_RELEASE_CAPSULE | Freq: Every day | ORAL | Status: DC

## 2015-07-15 MED ORDER — TRIAMCINOLONE ACETONIDE 0.025 % EX CREA: TOPICAL_CREAM | Freq: Two times a day (BID) | CUTANEOUS | Status: DC

## 2015-07-15 NOTE — Assessment & Plan Note (Signed)
We decided to start pravastatin at her last visit because of hyperlipidemia and elevated 10 year ASCVD risk. This is mostly being driven by his age. However he has excellent functional status and good life expectancy so we decided to be more aggressive. He is tolerating statin therapy well with no side effects. Continue pravastatin 20 mg daily.

## 2015-07-15 NOTE — Assessment & Plan Note (Signed)
Patient with diffuse osteoarthritis related to overuse and mildly overweight. Pain is well controlled, he is able to maintain his level of activity. Pain remains in his knees and bilateral ankles. I don't appreciate any effusions on exam today. We are going to continue supportive care which seems to be adequate for him now. He uses NSAIDs as needed, we can do steroid injections if pain worsens in the future.

## 2015-07-15 NOTE — Progress Notes (Signed)
   See Encounters tab for problem-based medical decision making  __________________________________________________________  HPI:  79 year old man here for follow-up of hypertension. He reports good compliance with starting amlodipine and his other antihypertensive medications. Denies any adverse side effects. No lightheadedness when standing. Denies any significant lower extremitiy edema. He does not check his blood pressure at home. Denies any shortness of breath or chest pain either at rest or with exertion. 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 His largest complaint is of arthritis type pain in his left knee and bilateral ankles. Says it's usually fine while he is working but if he works two days straight he will have a lot of stiffness the next day. Generally uses Advil with good improvement in his symptoms.   His wife reports that he has been "wheezing" more. He says that they can hear an audible wheeze at random times throughout the day from his breathing. Patient tells me this often happens when he is bending over at work working on something on the floor. Wife says that she will hear it's when sometimes he is watching TV and just sitting around. He denies feeling short of breath or having chest tightness. His cough is much improved and essentially resolved. No other recent upper respiratory infections. Denies any history of asthma. He is a very remote history of tobacco use. Never had spirometry. He's had a trial of albuterol in the past which she used and said had no benefit to this noise. Doesn't seem to bother him.  __________________________________________________________  Problem List: Patient Active Problem List   Diagnosis Date Noted  . Hyperlipidemia 05/20/2015  . Wheezing 04/13/2014  . Hand eczema 06/05/2009  . Prostate cancer metastatic to bone ([place]) 04/03/2009  . OBSTRUCTIVE SLEEP APNEA 07/06/2007  . Essential hypertension  07/09/2006  . Osteoarthritis of left knee 07/09/2006    Medications: Reconciled today in Epic __________________________________________________________  Physical Exam:  Vital Signs: Filed Vitals:   07/15/15 1140  BP: 122/80  Pulse: 80    Gen: Well appearing, NAD ENT: OP clear without erythema or exudate.  Neck: No cervical LAD, No thyromeg4aly or nodules, No JVD. CV: RRR, no murmurs Pulm: Normal effort, CTA throughout, he has a central wheeze over his vocal cords with clears with pursed lip blowing. Abd: Soft, NT, ND, normal BS.  Ext: Warm, no edema, normal joints Skin: No atypical appearing moles. Mild stasis changes in bilateral lower extremities.

## 2015-07-15 NOTE — Assessment & Plan Note (Signed)
Blood pressure at recheck is at goal today. There is mild LE edema since starting Amlodipine, but not enough to discontinue. Continue amlodipine, HCTZ, and losartan. Monitoring BMP due by August 2017.

## 2015-07-15 NOTE — Assessment & Plan Note (Signed)
Patient reports intermittent wheezing. On exam he has a central wheeze in his upper airway which stops with breathing maneuvers. I don't appreciate any peripheral wheezing on auscultation. I think this is most consistent with vocal cord dysfunction. Plan to start omeprazole 40mg  daily for trial of acid suppression over next three months. If it continues, we should perform spirometry with bronchodilator to evaluated for reactive airway disease.

## 2015-07-15 NOTE — Assessment & Plan Note (Signed)
He continues to have stuck on inflammatory plaques on his hands. It is either eczema or psoriasis., well controlled on current regimen. Continue treatment with triamcinolone topical bid, refilled today.

## 2015-11-17 ENCOUNTER — Other Ambulatory Visit: Payer: Self-pay | Admitting: Student in an Organized Health Care Education/Training Program

## 2015-11-18 NOTE — Telephone Encounter (Signed)
Asked to refill omeprazole.  Review of problem list and associated all notes for osteoarthritis of left knee fail to explicitly document indication for the omeprazole.  The medication was prescribed by the pt's PCP Dr. Evette Doffing so I assume it is indicated, likely as a gastric lining protectant given the patient's PRN ibuprofen use.  I refilled a 1 month supply with no further refills.  I will ask Dr. Evette Doffing to provide further refills, should it be indicated, upon his return.

## 2015-11-24 ENCOUNTER — Other Ambulatory Visit: Payer: Self-pay | Admitting: Student in an Organized Health Care Education/Training Program

## 2015-11-24 DIAGNOSIS — I10 Essential (primary) hypertension: Secondary | ICD-10-CM

## 2015-12-23 ENCOUNTER — Other Ambulatory Visit: Payer: Self-pay | Admitting: Student in an Organized Health Care Education/Training Program

## 2015-12-24 NOTE — Telephone Encounter (Signed)
It appears Dr. Evette Doffing started this medication as an acid suppressant for presumed vocal cord dysfunction that was causing a wheeze.  I will fill for 1 month, at which point Dr. Evette Doffing will have returned and can decide if further therapy is effective and indicated.

## 2016-01-01 ENCOUNTER — Telehealth: Payer: Self-pay | Admitting: Student in an Organized Health Care Education/Training Program

## 2016-01-01 NOTE — Telephone Encounter (Signed)
APT. REMINDER CALL, LMTCB °

## 2016-01-02 ENCOUNTER — Ambulatory Visit (INDEPENDENT_AMBULATORY_CARE_PROVIDER_SITE_OTHER): Payer: Commercial Managed Care - HMO | Admitting: Internal Medicine

## 2016-01-02 ENCOUNTER — Encounter: Payer: Self-pay | Admitting: Internal Medicine

## 2016-01-02 VITALS — BP 130/87 | HR 97 | Temp 97.9°F | Ht 70.5 in | Wt 250.3 lb

## 2016-01-02 DIAGNOSIS — M1712 Unilateral primary osteoarthritis, left knee: Secondary | ICD-10-CM | POA: Diagnosis not present

## 2016-01-02 DIAGNOSIS — R062 Wheezing: Secondary | ICD-10-CM | POA: Diagnosis not present

## 2016-01-02 NOTE — Assessment & Plan Note (Addendum)
Assessment He reports his wheezing is no better. It is worse as the day progresses and is associated with cough and congestion. 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 He denies any chest pain, prior PFTs, change in appetite, change in weight.    Physical exam is reassuring for no lung sounds though he certainly has wheezing while sitting. I agree with his PCPs assessment that this is more likely an upper airway problem which requires referral. Per review of the chart, he has gone through a trial of PPI, antihistamine, albuterol inhaler, and we still do not have an answer to the problem.  Plan -Refer to ENT for further evaluation and management

## 2016-01-02 NOTE — Progress Notes (Signed)
   Subjective:    Patient ID: [name], male    DOB: Apr 04, 1936, 80 y.o.   MRN: [id number]  HPI [name] is a 80 year old male who presents today for wheezing and left knee osteoarthritis. Please see assessment & plan for status of chronic medical problems.    Review of Systems  Constitutional: Negative for unexpected weight change.  Respiratory: Positive for cough, shortness of breath and wheezing.   Cardiovascular: Negative for chest pain.  Allergic/Immunologic: Negative for environmental allergies.       Objective:   Physical Exam  Constitutional: He is oriented to person, place, and time. He appears well-developed and well-nourished.  HENT:  Head: Normocephalic and atraumatic.  Eyes: Conjunctivae are normal. No scleral icterus.  Cardiovascular: Normal rate and regular rhythm.   Pulmonary/Chest: Effort normal and breath sounds normal. He has no wheezes.  Neurological: He is alert and oriented to person, place, and time.  Skin: Skin is warm and dry.          Assessment & Plan:

## 2016-01-02 NOTE — Assessment & Plan Note (Signed)
Assessment He reports that he has been noticing clicking and locking of his left knee though does not complain of any pain or discomfort. He would like to know what else he can do outside of medication and steroid injection. His wife underwent knee replacement recently, and he did not think it was a pleasurable experience.   Plan -Encouraged weight loss and provided him with a handout for meal planning. -Refer to the clinic health coach to which he is agreeable. I explained to him that every pound he loses from his abdomen is about 3-4 pounds less that his knee has to bear with activity.

## 2016-01-02 NOTE — Patient Instructions (Addendum)
We will work on getting you to ENT doctors. They can better look inside to figure out what's going on with your vocal cords.   Please see Dr. Evette Doffing back in August.

## 2016-01-03 NOTE — Progress Notes (Signed)
Internal Medicine Clinic Attending  Case discussed with Dr. Patel,Rushil at the time of the visit.  We reviewed the resident's history and exam and pertinent patient test results.  I agree with the assessment, diagnosis, and plan of care documented in the resident's note.  

## 2016-01-27 ENCOUNTER — Other Ambulatory Visit: Payer: Self-pay | Admitting: Internal Medicine

## 2016-01-28 ENCOUNTER — Telehealth: Payer: Self-pay | Admitting: Dietician

## 2016-01-28 ENCOUNTER — Ambulatory Visit: Payer: Commercial Managed Care - HMO | Admitting: Dietician

## 2016-01-28 NOTE — Telephone Encounter (Signed)
Called to follow up about missed appointment for Medical Nutrition Therapy. Wife to discuss rescheduling with patient and call back.

## 2016-02-03 NOTE — Addendum Note (Signed)
Addended by: Hulan Fray on: 02/03/2016 07:08 PM   Modules accepted: Orders, SmartSet

## 2016-03-04 ENCOUNTER — Other Ambulatory Visit: Payer: Self-pay | Admitting: Urology

## 2016-03-04 DIAGNOSIS — C61 Malignant neoplasm of prostate: Secondary | ICD-10-CM

## 2016-03-09 ENCOUNTER — Other Ambulatory Visit: Payer: Self-pay | Admitting: Urology

## 2016-03-09 DIAGNOSIS — Z7952 Long term (current) use of systemic steroids: Secondary | ICD-10-CM

## 2016-03-10 ENCOUNTER — Inpatient Hospital Stay
Admission: RE | Admit: 2016-03-10 | Discharge: 2016-03-10 | Disposition: A | Discharge status: Home / Self Care | Payer: Commercial Managed Care - HMO | Source: Ambulatory Visit | Attending: Urology | Admitting: Urology

## 2016-03-16 ENCOUNTER — Ambulatory Visit
Admission: RE | Admit: 2016-03-16 | Discharge: 2016-03-16 | Disposition: A | Discharge status: Home / Self Care | Payer: Commercial Managed Care - HMO | Source: Ambulatory Visit | Attending: Urology | Admitting: Urology

## 2016-03-16 DIAGNOSIS — Z7952 Long term (current) use of systemic steroids: Secondary | ICD-10-CM

## 2016-03-17 ENCOUNTER — Other Ambulatory Visit: Payer: Self-pay | Admitting: Student in an Organized Health Care Education/Training Program

## 2016-03-19 ENCOUNTER — Ambulatory Visit (HOSPITAL_COMMUNITY)
Admission: RE | Admit: 2016-03-19 | Discharge: 2016-03-19 | Disposition: A | Discharge status: Home / Self Care | Payer: Commercial Managed Care - HMO | Source: Ambulatory Visit | Attending: Urology | Admitting: Urology

## 2016-03-19 ENCOUNTER — Encounter (HOSPITAL_COMMUNITY)
Admission: RE | Admit: 2016-03-19 | Discharge: 2016-03-19 | Disposition: A | Discharge status: Home / Self Care | Payer: Commercial Managed Care - HMO | Source: Ambulatory Visit | Attending: Urology | Admitting: Urology

## 2016-03-19 DIAGNOSIS — C61 Malignant neoplasm of prostate: Secondary | ICD-10-CM | POA: Insufficient documentation

## 2016-03-19 DIAGNOSIS — R937 Abnormal findings on diagnostic imaging of other parts of musculoskeletal system: Secondary | ICD-10-CM | POA: Insufficient documentation

## 2016-03-19 MED ORDER — FLUDEOXYGLUCOSE F - 18 (FDG) INJECTION
26.4000 | Freq: Once | INTRAVENOUS | Status: AC | PRN
  Administered 2016-03-19: 26.4 via INTRAVENOUS

## 2016-06-01 ENCOUNTER — Encounter: Payer: Self-pay | Admitting: Student in an Organized Health Care Education/Training Program

## 2016-06-01 ENCOUNTER — Ambulatory Visit (INDEPENDENT_AMBULATORY_CARE_PROVIDER_SITE_OTHER): Payer: Commercial Managed Care - HMO | Admitting: Student in an Organized Health Care Education/Training Program

## 2016-06-01 VITALS — BP 148/75 | HR 56 | Temp 97.6°F | Ht 70.5 in | Wt 250.9 lb

## 2016-06-01 DIAGNOSIS — H538 Other visual disturbances: Secondary | ICD-10-CM | POA: Diagnosis not present

## 2016-06-01 DIAGNOSIS — G4733 Obstructive sleep apnea (adult) (pediatric): Secondary | ICD-10-CM | POA: Diagnosis not present

## 2016-06-01 DIAGNOSIS — Z23 Encounter for immunization: Secondary | ICD-10-CM

## 2016-06-01 DIAGNOSIS — I10 Essential (primary) hypertension: Secondary | ICD-10-CM

## 2016-06-01 DIAGNOSIS — R7303 Prediabetes: Secondary | ICD-10-CM | POA: Diagnosis not present

## 2016-06-01 DIAGNOSIS — Z79899 Other long term (current) drug therapy: Secondary | ICD-10-CM

## 2016-06-01 DIAGNOSIS — M1712 Unilateral primary osteoarthritis, left knee: Secondary | ICD-10-CM

## 2016-06-01 DIAGNOSIS — Z87891 Personal history of nicotine dependence: Secondary | ICD-10-CM

## 2016-06-01 DIAGNOSIS — E1169 Type 2 diabetes mellitus with other specified complication: Secondary | ICD-10-CM | POA: Insufficient documentation

## 2016-06-01 DIAGNOSIS — Z6835 Body mass index (BMI) 35.0-35.9, adult: Secondary | ICD-10-CM

## 2016-06-01 DIAGNOSIS — E669 Obesity, unspecified: Secondary | ICD-10-CM

## 2016-06-01 MED ORDER — AMLODIPINE BESYLATE 10 MG PO TABS: 10.0000 mg | ORAL_TABLET | Freq: Every day | ORAL | 3 refills | Status: DC

## 2016-06-01 MED ORDER — HYDROCHLOROTHIAZIDE 25 MG PO TABS: 25.0000 mg | ORAL_TABLET | Freq: Every day | ORAL | 3 refills | Status: DC

## 2016-06-01 NOTE — Patient Instructions (Addendum)
1. Increase amlodipine to 10mg  a day for your high blood pressure.   2. Stop taking omeprazole. If you have acid reflux then use Tums or Zantac when it happens.   3. We will get some blood work today and I will send you the results later this week.

## 2016-06-01 NOTE — Progress Notes (Signed)
Assessment and Plan:  See Encounters tab for problem-based medical decision making.   __________________________________________________________  HPI:  80 year old man here for follow-up of hypertension. Patient reports doing very well at home, he still works part time as a Games developer. Has good exertional capacity without chest pain or dyspnea on exertion, he can walk about one flight of stairs before feeling short of breath. He reports good compliance with all his medications without adverse side effects. He denies chest pain or lightheadedness upon standing. He's eating and drinking well with no unexpected weight loss. He reports arthritis in his knees is stable, does not require daily medications. It's been much improved since he has stopped climbing ladders. He continues to have modest lower urinary tract symptoms, awakens several times at night. He follows with a urologist for his locally invasive prostate cancer for which she is on androgen deprivation therapy. Urologist has warned her that his prostate disease might advanced to the point where he has to consider in and out catheterizations at home. His wife accompanies him today, they're doing well at home, they have a lot of family locally in [place] including a great-grandchild.  __________________________________________________________  Problem List: Patient Active Problem List   Diagnosis Date Noted  . Prostate cancer metastatic to bone ([place]) 04/03/2009    Priority: High  . Essential hypertension 07/09/2006    Priority: High  . Prediabetes 06/01/2016    Priority: Medium  . Hyperlipidemia 05/20/2015    Priority: Low  . Obstructive sleep apnea 07/06/2007    Priority: Low  . Osteoarthritis of left knee 07/09/2006    Priority: Low  . Blurry vision, right eye 06/01/2016    Medications: Reconciled today in Epic __________________________________________________________  Physical Exam:  Vital Signs: Vitals:   06/01/16 0954    BP: (!) 148/75  Pulse: (!) 56  Temp: 97.6 F (36.4 C)  TempSrc: Oral  SpO2: 100%  Weight: 250 lb 14.4 oz (113.8 kg)  Height: 5' 10.5" (1.791 m)    Gen: Well appearing, NAD CV: RRR, no murmurs Pulm: Normal effort, CTA throughout, no wheezing Abd: Soft, NT, ND, normal BS.  Ext: Warm, no edema, moderate crepitus in bilateral knees, Heberden's nodes on bilateral DIP joints. Skin: No atypical appearing moles. No rashes.

## 2016-06-01 NOTE — Assessment & Plan Note (Signed)
Patient is obese with heart to control hypertension. He is at risk for metabolic syndrome. Last A1c in our system was 5.7% in 2012. I'm going to recheck another A1c today to ensure he has not progressed to diabetes.

## 2016-06-01 NOTE — Assessment & Plan Note (Signed)
Blood pressure is above goal today. Plan is to increase amlodipine from 5 mg to 10 mg daily. Continue HCTZ 25 mg and losartan 100 mg daily. Plan to check BMP today. Follow up with me in 3 months for blood pressure recheck.

## 2016-06-01 NOTE — Assessment & Plan Note (Signed)
The patient has a very thick neck and large tongue, apparently he was diagnosed with severe obstructive sleep apnea in 2005. He wore CPAP for several months but discontinued it because he was unable to tolerate the mask. He says difficult for him because he gets up frequently at night to urinate. Currently he is not endorsing very significant daytime somnolence or fatigue. His blood pressure is a little difficult to control likely from the OSA. We are going to continue to monitor, if his symptoms worsen will have to repeat his sleep study as it over 28 years old.

## 2016-06-01 NOTE — Assessment & Plan Note (Signed)
Reports blurry vision of the right thigh over the last several weeks. Denies any pain of the eye or headache. He has seen his optometrist to recommended that he see an ophthalmologist. I have placed a referral for Dr. Katy Fitch.

## 2016-06-02 ENCOUNTER — Encounter: Payer: Self-pay | Admitting: Student in an Organized Health Care Education/Training Program

## 2016-06-02 LAB — BMP8+ANION GAP
ANION GAP: 15 mmol/L (ref 10.0–18.0)
BUN/Creatinine Ratio: 9 — ABNORMAL LOW (ref 10–24)
BUN: 10 mg/dL (ref 8–27)
CO2: 22 mmol/L (ref 18–29)
CREATININE: 1.09 mg/dL (ref 0.76–1.27)
Calcium: 9.8 mg/dL (ref 8.6–10.2)
Chloride: 103 mmol/L (ref 96–106)
GFR calc Af Amer: 75 mL/min/{1.73_m2} (ref >59)
GFR calc non Af Amer: 65 mL/min/{1.73_m2} (ref >59)
Glucose: 126 mg/dL — ABNORMAL HIGH (ref 65–99)
POTASSIUM: 3.8 mmol/L (ref 3.5–5.2)
SODIUM: 140 mmol/L (ref 134–144)

## 2016-06-02 LAB — HEMOGLOBIN A1C
ESTIMATED AVERAGE GLUCOSE: 134 mg/dL
HEMOGLOBIN A1C: 6.3 % — AB (ref 4.8–5.6)

## 2016-07-09 ENCOUNTER — Telehealth: Payer: Self-pay | Admitting: *Deleted

## 2016-07-09 ENCOUNTER — Other Ambulatory Visit: Payer: Self-pay | Admitting: *Deleted

## 2016-07-09 DIAGNOSIS — I10 Essential (primary) hypertension: Secondary | ICD-10-CM

## 2016-07-09 NOTE — Telephone Encounter (Signed)
Received rx refill requests from [facility]. Called pt to see if [facility]'s [facility] - no answer; left message.

## 2016-07-10 MED ORDER — PRAVASTATIN SODIUM 20 MG PO TABS: 20.0000 mg | ORAL_TABLET | Freq: Every evening | ORAL | 3 refills | Status: DC

## 2016-07-10 MED ORDER — AMLODIPINE BESYLATE 10 MG PO TABS: 10.0000 mg | ORAL_TABLET | Freq: Every day | ORAL | 3 refills | Status: DC

## 2016-07-10 MED ORDER — LOSARTAN POTASSIUM 100 MG PO TABS: 100.0000 mg | ORAL_TABLET | Freq: Every day | ORAL | 3 refills | Status: DC

## 2016-07-10 MED ORDER — HYDROCHLOROTHIAZIDE 25 MG PO TABS: 25.0000 mg | ORAL_TABLET | Freq: Every day | ORAL | 3 refills | Status: DC

## 2016-07-13 NOTE — Telephone Encounter (Signed)
Refills entered per Bonnita Nasuti A.

## 2016-08-01 ENCOUNTER — Other Ambulatory Visit: Payer: Self-pay | Admitting: Student in an Organized Health Care Education/Training Program

## 2016-08-06 ENCOUNTER — Other Ambulatory Visit: Payer: Self-pay

## 2016-08-07 MED ORDER — OMEPRAZOLE 40 MG PO CPDR: DELAYED_RELEASE_CAPSULE | ORAL | 5 refills | Status: DC

## 2016-08-11 NOTE — Telephone Encounter (Signed)
Received [facility] from [facility] for pt's omeprazole 40mg .  Rx was denied as pcp has already filled rx on 08/07/2016.  Rx was sent to [facility]. Call made to pt to confirm pharmacy, left message on recorder.Regenia Skeeter, Iline Buchinger Cassady1/2/201810:30 AM

## 2016-09-03 ENCOUNTER — Encounter: Payer: Self-pay | Admitting: Student in an Organized Health Care Education/Training Program

## 2016-09-03 DIAGNOSIS — H4921 Sixth [abducent] nerve palsy, right eye: Secondary | ICD-10-CM | POA: Insufficient documentation

## 2016-09-04 ENCOUNTER — Telehealth: Payer: Self-pay | Admitting: Student in an Organized Health Care Education/Training Program

## 2016-09-04 NOTE — Telephone Encounter (Signed)
APT. REMINDER CALL, LMTCB °

## 2016-09-07 ENCOUNTER — Ambulatory Visit (INDEPENDENT_AMBULATORY_CARE_PROVIDER_SITE_OTHER): Payer: Medicare PPO | Admitting: Student in an Organized Health Care Education/Training Program

## 2016-09-07 VITALS — BP 144/74 | HR 65 | Ht 70.0 in | Wt 256.0 lb

## 2016-09-07 DIAGNOSIS — E119 Type 2 diabetes mellitus without complications: Secondary | ICD-10-CM

## 2016-09-07 DIAGNOSIS — Z87891 Personal history of nicotine dependence: Secondary | ICD-10-CM

## 2016-09-07 DIAGNOSIS — H4921 Sixth [abducent] nerve palsy, right eye: Secondary | ICD-10-CM

## 2016-09-07 DIAGNOSIS — Z6836 Body mass index (BMI) 36.0-36.9, adult: Secondary | ICD-10-CM

## 2016-09-07 DIAGNOSIS — E669 Obesity, unspecified: Secondary | ICD-10-CM

## 2016-09-07 DIAGNOSIS — Z79899 Other long term (current) drug therapy: Secondary | ICD-10-CM

## 2016-09-07 DIAGNOSIS — H532 Diplopia: Secondary | ICD-10-CM | POA: Diagnosis not present

## 2016-09-07 DIAGNOSIS — I10 Essential (primary) hypertension: Secondary | ICD-10-CM

## 2016-09-07 DIAGNOSIS — E7800 Pure hypercholesterolemia, unspecified: Secondary | ICD-10-CM

## 2016-09-07 DIAGNOSIS — Z8546 Personal history of malignant neoplasm of prostate: Secondary | ICD-10-CM

## 2016-09-07 DIAGNOSIS — I6782 Cerebral ischemia: Secondary | ICD-10-CM | POA: Insufficient documentation

## 2016-09-07 DIAGNOSIS — R7303 Prediabetes: Secondary | ICD-10-CM | POA: Diagnosis not present

## 2016-09-07 DIAGNOSIS — R9402 Abnormal brain scan: Secondary | ICD-10-CM

## 2016-09-07 DIAGNOSIS — Z85830 Personal history of malignant neoplasm of bone: Secondary | ICD-10-CM

## 2016-09-07 LAB — GLUCOSE, CAPILLARY: Glucose-Capillary: 159 mg/dL — ABNORMAL HIGH (ref 65–99)

## 2016-09-07 LAB — POCT GLYCOSYLATED HEMOGLOBIN (HGB A1C): HEMOGLOBIN A1C: 7

## 2016-09-07 MED ORDER — METFORMIN HCL ER 500 MG PO TB24: 500.0000 mg | ORAL_TABLET | Freq: Every day | ORAL | 0 refills | Status: DC

## 2016-09-07 MED ORDER — ASPIRIN EC 81 MG PO TBEC: 81.0000 mg | DELAYED_RELEASE_TABLET | Freq: Every day | ORAL | 2 refills | Status: AC

## 2016-09-07 MED ORDER — METFORMIN HCL ER 500 MG PO TB24: 500.0000 mg | ORAL_TABLET | Freq: Every day | ORAL | 3 refills | Status: DC

## 2016-09-07 NOTE — Assessment & Plan Note (Signed)
MRI brain showed increased T2 signal intensity in the periventricular white matter and in the central pons consistent with chronic cerebral ischemia and/or lacunar infarctions. Likely this represents chronic cerebral vascular disease. I've had him on moderate intensity pravastatin for about 1 year. Plan to check lipids today and if his lipids are still uncontrolled we can increase to high intensity. Also plan to start aspirin 81 mg daily. I would like to start treating him as if he is secondary prevention given these imaging findings.

## 2016-09-07 NOTE — Patient Instructions (Signed)
1. Do not lose heart. 80% of people with sudden double vision like yours will get better within 6 months.   2. Start taking metformin, one tablet a day, for your new diabetes.   3. Start taking a baby aspirin 81mg  every day  4. Come back in three months and I will check your diabetes then.

## 2016-09-07 NOTE — Progress Notes (Signed)
Assessment and Plan:  See Encounters tab for problem-based medical decision making.   __________________________________________________________  HPI:  81 year old man here for follow-up of his hypertension. He has an acute complaint today of double vision that has become progressively worse over the last 2 weeks. He is oriented and evaluated by his ophthalmologist. He says that this started around the time that he had his right intraocular lens repaired and subsequently had a vitreous injection. Now he says he has television anytime he looks to his right side. It's causing him to feel off balance, he denies vertigo or dizziness. He was having a headache around the area of his right eye which is now much improved. Denies changes in his taste or smell sensation. The diplopia does not worsen as the day goes on. He says it is a horizontal diplopia, not vertical. It is making it difficult for him to work and do his activities of daily living. To compensate he says he's just keeping everything on his left side.  No fevers or chills. No chest pain, no dyspnea on exertion. Continues to have a good exertional capacity. 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  __________________________________________________________  Problem List: Patient Active Problem List   Diagnosis Date Noted  . Sixth cranial nerve palsy, right 09/03/2016    Priority: High  . Prostate cancer metastatic to bone ([place]) 04/03/2009    Priority: High  . Essential hypertension 07/09/2006    Priority: High  . Diabetes ([place]) 06/01/2016    Priority: Medium  . Hyperlipidemia 05/20/2015    Priority: Low  . Obstructive sleep apnea 07/06/2007    Priority: Low  . Osteoarthritis of left knee 07/09/2006    Priority: Low    Medications: Reconciled today in Epic __________________________________________________________  Physical Exam:  Vital  Signs: Vitals:   09/07/16 1109 09/07/16 1150  BP: (!) 146/67 (!) 144/74  Pulse: 67 65  Weight: 256 lb (116.1 kg)   Height: 5\' 10"  (1.778 m)     Gen: Well appearing, NAD Neuro: Right eye unable to abduct past the neutral position, other ocular movements are intact. He has a normal gait, normal strength and sensation throughout, no other cranial nerve deficits. CV: RRR, no murmurs Pulm: Normal effort, CTA throughout, no wheezing Abd: Soft, NT, ND, normal BS.  Ext: Warm, no edema, normal joints

## 2016-09-07 NOTE — Assessment & Plan Note (Signed)
New horizontal diplopia that started about 2 weeks ago and has been progressing. This is consistent with a right sided cranial nerve VI palsy. Today his right eye has no abduction past the neutral position. I reviewed his MRI brain that was done at [facility] last week, present and care everywhere, which ruled out mass lesion, shows chronic lacunar infarctions and vascular disease, no acute infarction or demyelinating disease. I spoke with Dr. Katy Fitch over the phone today, and it seems the abduction defect was subtle last week, now it is much worse. Inflammatory markers were normal in his office, so giant cell arteritis seems less likely. According to Dr. Katy Fitch it is unlikely that this defect is an injury related to the vitreous injection a few weeks ago. It may be an idiopathic CN 6 palsy, which according to UTD 80% of people will have spontaneous recovery within 6 months. I urged the patient to not drive while he has diplopia and he agrees, his wife can drive him. Also will need to take time off work given safety concerns on the job site. Dr. Zenia Resides office will call him to have him come back to his office this week so he can re-evaluate the deficit.

## 2016-09-07 NOTE — Assessment & Plan Note (Signed)
Historically the patient has had prediabetes since at least 2012. I seen his A1c's increasing over the last 1 year. Today's point of care A1c is 7.0%. Think this is now consistent with a diagnosis of type 2 diabetes due to insulin resistance coming from age and obesity. Plan is to initiate metformin 500 mg once daily. I talked to him about adverse side effects. We are going to start aspirin 81 mg and continue pravastatin 20 mg every day for primary prevention. Recheck his lipids today and see if we need to increase the statin to high intensity.

## 2016-09-07 NOTE — Assessment & Plan Note (Signed)
Blood pressure is a little above goal today, but not too bad. We are making other changes to his medications today, so I am going to continue his current regimen of HCTZ, amlodipine, and losartan. Follow-up in 3 months and if still elevated we can add on a beta blocker.

## 2016-09-08 LAB — LIPID PANEL
CHOL/HDL RATIO: 4.4 ratio (ref 0.0–5.0)
Cholesterol, Total: 159 mg/dL (ref 100–199)
HDL: 36 mg/dL — AB (ref >39)
LDL Calculated: 92 mg/dL (ref 0–99)
Triglycerides: 154 mg/dL — ABNORMAL HIGH (ref 0–149)
VLDL CHOLESTEROL CAL: 31 mg/dL (ref 5–40)

## 2016-09-08 LAB — TSH: TSH: 3.44 u[IU]/mL (ref 0.450–4.500)

## 2016-09-09 ENCOUNTER — Encounter (HOSPITAL_COMMUNITY): Payer: Self-pay | Admitting: General Practice

## 2016-09-09 ENCOUNTER — Observation Stay (HOSPITAL_COMMUNITY)
Admission: AD | Admit: 2016-09-09 | Discharge: 2016-09-10 | Disposition: A | Discharge status: Home / Self Care | Payer: Medicare PPO | Source: Ambulatory Visit | Attending: Student in an Organized Health Care Education/Training Program | Admitting: Student in an Organized Health Care Education/Training Program

## 2016-09-09 ENCOUNTER — Telehealth: Payer: Self-pay | Admitting: *Deleted

## 2016-09-09 DIAGNOSIS — Z87891 Personal history of nicotine dependence: Secondary | ICD-10-CM | POA: Insufficient documentation

## 2016-09-09 DIAGNOSIS — E119 Type 2 diabetes mellitus without complications: Secondary | ICD-10-CM | POA: Insufficient documentation

## 2016-09-09 DIAGNOSIS — Z8551 Personal history of malignant neoplasm of bladder: Secondary | ICD-10-CM | POA: Diagnosis not present

## 2016-09-09 DIAGNOSIS — E7800 Pure hypercholesterolemia, unspecified: Secondary | ICD-10-CM | POA: Insufficient documentation

## 2016-09-09 DIAGNOSIS — E669 Obesity, unspecified: Secondary | ICD-10-CM | POA: Diagnosis not present

## 2016-09-09 DIAGNOSIS — H4921 Sixth [abducent] nerve palsy, right eye: Secondary | ICD-10-CM | POA: Diagnosis present

## 2016-09-09 DIAGNOSIS — I10 Essential (primary) hypertension: Secondary | ICD-10-CM | POA: Insufficient documentation

## 2016-09-09 DIAGNOSIS — Z7984 Long term (current) use of oral hypoglycemic drugs: Secondary | ICD-10-CM | POA: Diagnosis not present

## 2016-09-09 DIAGNOSIS — Z7982 Long term (current) use of aspirin: Secondary | ICD-10-CM | POA: Insufficient documentation

## 2016-09-09 DIAGNOSIS — Z79899 Other long term (current) drug therapy: Secondary | ICD-10-CM | POA: Diagnosis not present

## 2016-09-09 DIAGNOSIS — C61 Malignant neoplasm of prostate: Secondary | ICD-10-CM | POA: Diagnosis not present

## 2016-09-09 DIAGNOSIS — E785 Hyperlipidemia, unspecified: Secondary | ICD-10-CM

## 2016-09-09 DIAGNOSIS — Z86010 Personal history of colonic polyps: Secondary | ICD-10-CM

## 2016-09-09 DIAGNOSIS — G4733 Obstructive sleep apnea (adult) (pediatric): Secondary | ICD-10-CM | POA: Diagnosis not present

## 2016-09-09 DIAGNOSIS — E1169 Type 2 diabetes mellitus with other specified complication: Secondary | ICD-10-CM

## 2016-09-09 DIAGNOSIS — Z8673 Personal history of transient ischemic attack (TIA), and cerebral infarction without residual deficits: Secondary | ICD-10-CM

## 2016-09-09 DIAGNOSIS — K219 Gastro-esophageal reflux disease without esophagitis: Secondary | ICD-10-CM | POA: Insufficient documentation

## 2016-09-09 DIAGNOSIS — Z886 Allergy status to analgesic agent status: Secondary | ICD-10-CM

## 2016-09-09 DIAGNOSIS — Z8249 Family history of ischemic heart disease and other diseases of the circulatory system: Secondary | ICD-10-CM

## 2016-09-09 DIAGNOSIS — Z923 Personal history of irradiation: Secondary | ICD-10-CM

## 2016-09-09 DIAGNOSIS — Z8619 Personal history of other infectious and parasitic diseases: Secondary | ICD-10-CM

## 2016-09-09 DIAGNOSIS — Z9841 Cataract extraction status, right eye: Secondary | ICD-10-CM

## 2016-09-09 HISTORY — DX: Personal history of urinary calculi: Z87.442

## 2016-09-09 HISTORY — DX: Gastro-esophageal reflux disease without esophagitis: K21.9

## 2016-09-09 HISTORY — DX: Sleep apnea, unspecified: G47.30

## 2016-09-09 HISTORY — DX: Pure hypercholesterolemia, unspecified: E78.00

## 2016-09-09 HISTORY — DX: Malignant neoplasm of bladder, unspecified: C67.9

## 2016-09-09 HISTORY — DX: Cardiomegaly: I51.7

## 2016-09-09 HISTORY — DX: Other specified health status: Z78.9

## 2016-09-09 LAB — BASIC METABOLIC PANEL
ANION GAP: 10 (ref 5–15)
BUN: 18 mg/dL (ref 6–20)
CO2: 23 mmol/L (ref 22–32)
Calcium: 10 mg/dL (ref 8.9–10.3)
Chloride: 104 mmol/L (ref 101–111)
Creatinine, Ser: 1.18 mg/dL (ref 0.61–1.24)
GFR calc Af Amer: >60 mL/min (ref >60)
GFR, EST NON AFRICAN AMERICAN: 57 mL/min — AB (ref >60)
GLUCOSE: 151 mg/dL — AB (ref 65–99)
POTASSIUM: 3.7 mmol/L (ref 3.5–5.1)
Sodium: 137 mmol/L (ref 135–145)

## 2016-09-09 LAB — GLUCOSE, CAPILLARY
GLUCOSE-CAPILLARY: 158 mg/dL — AB (ref 65–99)
Glucose-Capillary: 142 mg/dL — ABNORMAL HIGH (ref 65–99)

## 2016-09-09 MED ORDER — HYDROCHLOROTHIAZIDE 25 MG PO TABS
25.0000 mg | ORAL_TABLET | Freq: Every day | ORAL | Status: DC
  Administered 2016-09-10: 25 mg via ORAL
  Filled 2016-09-09: qty 1

## 2016-09-09 MED ORDER — INSULIN ASPART 100 UNIT/ML ~~LOC~~ SOLN
0.0000 [IU] | Freq: Three times a day (TID) | SUBCUTANEOUS | Status: DC
  Administered 2016-09-09: 2 [IU] via SUBCUTANEOUS
  Administered 2016-09-10: 1 [IU] via SUBCUTANEOUS
  Administered 2016-09-10: 2 [IU] via SUBCUTANEOUS

## 2016-09-09 MED ORDER — LOSARTAN POTASSIUM 50 MG PO TABS
100.0000 mg | ORAL_TABLET | Freq: Every day | ORAL | Status: DC
  Administered 2016-09-10: 100 mg via ORAL
  Filled 2016-09-09: qty 2

## 2016-09-09 MED ORDER — PANTOPRAZOLE SODIUM 40 MG PO TBEC
40.0000 mg | DELAYED_RELEASE_TABLET | Freq: Every day | ORAL | Status: DC
  Administered 2016-09-10: 40 mg via ORAL
  Filled 2016-09-09: qty 1

## 2016-09-09 MED ORDER — BICALUTAMIDE 50 MG PO TABS
50.0000 mg | ORAL_TABLET | Freq: Every day | ORAL | Status: DC
  Administered 2016-09-10: 50 mg via ORAL
  Filled 2016-09-09: qty 1

## 2016-09-09 MED ORDER — ASPIRIN EC 81 MG PO TBEC
81.0000 mg | DELAYED_RELEASE_TABLET | Freq: Every day | ORAL | Status: DC
  Administered 2016-09-09 – 2016-09-10 (×2): 81 mg via ORAL
  Filled 2016-09-09 (×2): qty 1

## 2016-09-09 MED ORDER — ENOXAPARIN SODIUM 60 MG/0.6ML ~~LOC~~ SOLN
55.0000 mg | SUBCUTANEOUS | Status: DC
  Administered 2016-09-09: 55 mg via SUBCUTANEOUS
  Filled 2016-09-09: qty 0.6

## 2016-09-09 MED ORDER — AMLODIPINE BESYLATE 10 MG PO TABS
10.0000 mg | ORAL_TABLET | Freq: Every day | ORAL | Status: DC
  Administered 2016-09-10: 10 mg via ORAL
  Filled 2016-09-09: qty 1

## 2016-09-09 MED ORDER — PRAVASTATIN SODIUM 10 MG PO TABS: 20.0000 mg | ORAL_TABLET | Freq: Every evening | ORAL | Status: DC

## 2016-09-09 NOTE — Consult Note (Signed)
NEURO HOSPITALIST CONSULT NOTE   Requestig physician: Dr. Evette Doffing  Reason for Consult: Right 6th nerve palsy  History obtained from:  Patient and Chart    HPI:                                                                                                                                          [name] is an 81 y.o. male who presented from his primary care 58 office after a new right abducens palsy was seen on exam. He was initially being seen in follow up for his HTN. However, he had a new complaint at the office of double vision that had progressively worsened over the past 2 weeks. The double vision began at about the time he had a right intraocular lens repaired with subsequent right vitreous injection. He was unable to see well when looking to the right. He has had right periorbital headache as well. The patient had a recent MRI head performed at an outside facility; images were personally reviewed, revealing mild chronic small vessel ischemic changes within the cerebral white matter, as well as a hazy, small central pontine hyperintensity on the FLAIR images. No findings to suggest a structural etiology for the right abducens palsy are seen on my review of the images.   Past Medical History:  Diagnosis Date  . Adenomatous colon polyp 12/14/2005   5 mm polyp in descending colon, found on colonoscopy by Dr. Wilford Corner  . Allergic rhinitis   . Asymptomatic cholelithiasis 11/30/2014  . Bladder calculi   . Bladder cancer ([place]) 2010   S/P TURBT   . Bladder neck contracture   . Enlarged heart    "slight" (09/09/2016)  . GERD (gastroesophageal reflux disease)   . Hand eczema   . Hepatitis, unspecified PT STATES HAD HEPATITIS APPROX 1980'S ; UNSURE WHAT TYPE   Hepatitis C RNA Quant on July 06, 2007 showed no detectable virus.  . High cholesterol   . History of kidney stones   . Hypertension   . Nocturia   . OSA on CPAP    Severe by  diagnostic polysomnogram 06/22/2004.  Marland Kitchen Prostate cancer ([place]) 2009   S/P EXTERNAL RADIATION, SEED IMPLANTS  . Self-catheterizes urinary bladder    "q couple weeks prn" (09/09/2016)  . Sleep apnea    "don't use the mask anymore" (09/09/2016)    Past Surgical History:  Procedure Laterality Date  . CARDIOVASCULAR STRESS TEST  07-14-2011  DR NISHAN   NORMAL NUCLEAR STUDY/ EF 62%  . CIRCUMCISION N/A 04/08/2015   Procedure: CIRCUMCISION ADULT;  Surgeon: Franchot Gallo, MD;  Location: [facility];  Service: Urology;  Laterality: N/A;  . CYSTOSCOPY WITH LITHOLAPAXY N/A 10/24/2012   Procedure: CYSTOSCOPY WITH LITHOLAPAXY;  Surgeon: Franchot Gallo, MD;  Location:  [facility];  Service: Urology;  Laterality: N/A;  TRANSURETHRAL RESECTION OF BLADDER NECK CONTRACTURE WITH COLLINS KNIFE     . CYSTOSCOPY WITH URETHRAL DILATATION N/A 04/08/2015   Procedure: CYSTOSCOPY WITH BALLOON DILATATION OF BLADDER NECK CONTRACTURE;  Surgeon: Franchot Gallo, MD;  Location: [facility];  Service: Urology;  Laterality: N/A;  . KNEE ARTHROSCOPY Bilateral 1970's  . RADIOACTIVE PROSTATE SEED IMPLANTS  04-11-2008  . TRANSURETHRAL RESECTION OF BLADDER NECK  12-19-2008  . TRANSURETHRAL RESECTION OF BLADDER TUMOR  02-19-2009   AND TUR BLADDER NECK    Family History  Problem Relation Age of Onset  . Heart attack Mother 74  . Cancer Neg Hx   . Diabetes Neg Hx    Social History:  reports that he quit smoking about 11 years ago. His smoking use included Cigarettes. He has a 6.25 pack-year smoking history. He has never used smokeless tobacco. He reports that he does not drink alcohol or use drugs.  Allergies  Allergen Reactions  . Aspirin Other (See Comments)    GI symptoms - nausea    MEDICATIONS:                                                                                                                     Scheduled: . amLODipine  10 mg Oral Daily  . aspirin EC  81 mg Oral Daily  . bicalutamide   50 mg Oral Daily  . enoxaparin (LOVENOX) injection  55 mg Subcutaneous Q24H  . hydrochlorothiazide  25 mg Oral Daily  . insulin aspart  0-9 Units Subcutaneous TID WC  . losartan  100 mg Oral Daily  . pantoprazole  40 mg Oral Daily  . pravastatin  20 mg Oral QPM    ROS:                                                                                                                                       History obtained from patient. Denies chest pain, SOB, fevers or chills.   Blood pressure (!) 162/83, pulse 76, temperature 97.7 F (36.5 C), temperature source Oral, resp. rate 18, height 5\' 10"  (1.778 m), weight 116.3 kg (256 lb 6.3 oz), SpO2 99 %.   General Examination:  HEENT-  Normocephalic/atraumatic.  Lungs- No gross wheezing. Respirations unlabored.  Extremities- No edema.   Neurological Examination Mental Status: Alert, oriented, thought content appropriate.  Speech fluent without evidence of aphasia.  Able to follow all commands without difficulty. Cranial Nerves: II: Visual fields intact to bedside confrontation. PERRL.  III,IV, VI: ptosis not present, Unable to abduct right eye more than 3 mm past the midline; extra-ocular motions otherwise normal. Notes worsened binocular double vision when gazing towards the right, which completely resolves when gazing to the left.  V,VII: smile symmetric, facial temperature sensation normal bilaterally VIII: hearing intact to conversation IX,X: no hypophonia XI: Symmetric XII: midline tongue extension Motor: Right : Upper extremity   5/5    Left:     Upper extremity   5/5  Lower extremity   5/5     Lower extremity   5/5 Normal tone throughout; no atrophy noted Sensory: Temperature and light touch sensation intact in all 4 extremities proximally Deep Tendon Reflexes: Hypoactive upper and lower extremity reflexes.  Cerebellar: No ataxia  with FNF bilaterally Gait: Deferred.    Lab Results: Basic Metabolic Panel:  Recent Labs Lab 09/09/16 1501  NA 137  K 3.7  CL 104  CO2 23  GLUCOSE 151*  BUN 18  CREATININE 1.18  CALCIUM 10.0    Liver Function Tests: No results for input(s): AST, ALT, ALKPHOS, BILITOT, PROT, ALBUMIN in the last 168 hours. No results for input(s): LIPASE, AMYLASE in the last 168 hours. No results for input(s): AMMONIA in the last 168 hours.  CBC: No results for input(s): WBC, NEUTROABS, HGB, HCT, MCV, PLT in the last 168 hours.  Cardiac Enzymes: No results for input(s): CKTOTAL, CKMB, CKMBINDEX, TROPONINI in the last 168 hours.  Lipid Panel:  Recent Labs Lab 09/07/16 1157  CHOL 159  TRIG 154*  HDL 36*  CHOLHDL 4.4  LDLCALC 92    CBG:  Recent Labs Lab 09/07/16 1110 09/09/16 1707  GLUCAP 159* 158*    Microbiology: Results for orders placed or performed in visit on 07/19/14  Fecal Fat, Qualitative     Status: None   Collection Time: 07/19/14  7:30 AM  Result Value Ref Range Status   Fecal Fat Qualitative Normal Normal Final    Comment: Normal - Tiny fat globules, <1 micron in diameter          and too difficult to count, were observed          microscopically under high power. Abnormal -  Fat globules, 1 to 8 microns in             diameter, and <100 globules per high             power field were observed micro-             scopically. Grossly Abnormal - Large fat globules, 9 to 75               microns in diameter, and so numerous               that there was very little fecal               background observed microscopically               under high power.     Coagulation Studies: No results for input(s): LABPROT, INR in the last 72 hours.  Imaging: No results found.  Assessment: 81 year old male with right 6th nerve  palsy 1. Exam shows findings most consistent with isolated right abducens palsy. Most likely secondary to nerve infarction given vascular risk  factors of HTN and DM. Will need to rule out a structural or inflammatory lesion with MRI brain and orbits +/- contrast.  2. HTN. 3. Newly diagnosed DM, per family. HgbA1C at recent office visit was 6.3.  4. Hypoactive reflexes. Most likely incidental, as he has no motor weakness and his TSH is normal.   Recommendations: 1. Will need to rule out a structural or inflammatory lesion with MRI brain and orbits +/- contrast.  2. If no lesion is seen on MRI, prognosis for gradual recovery and normalization of double vision is relatively good. 3. BP and glucose management.   Electronically signed: Dr. Kerney Elbe 09/09/2016, 7:30 PM

## 2016-09-09 NOTE — H&P (Signed)
Date: 09/09/2016               Patient Name:  [name] MRN: [id number]  DOB: 21-Jul-1936 Age / Sex: 81 y.o., male   PCP: [name], MD         Medical Service: Internal Medicine Teaching Service         Attending Physician: Dr. Lalla Brothers    First Contact: Dr. Asencion Partridge Pager: [phone]  Second Contact: Dr. Maryellen Pile Pager: [phone]       After Hours (After 5p/  First Contact Pager: [phone]  weekends / holidays): Second Contact Pager: [phone]   Chief Complaint: Diplopia  History of Present Illness: [name] is a 81 y.o. woman with PMH HTN, DM2, obesity, prostate cancer, OSA, GERD, cerebrovascular disease who presents for worsening difficulties with abduction of his right eye. He reports that he underwent a cataract surgery and lens replacement of his right eye in 07/2016. A few weeks later he developed floaters in his right eye and went to see his ophthalmologist and was prescribed eye drops which improved this symptom. 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 He had also noticed pain/headache in his sinuses and behind his right eye that remains intermittent and has gradually improved. He was seen by his ophthalmology one week ago and was found to have a subtle right CN 6 palsy with mildly decreased abduction of his right eye. On follow up visit today his ophthalmologist felt that his abduction had acutely worsened, and was near complete. He denies any vision loss or residual floaters, fevers, chills, or jaw claudication. He was admitted to the IMTS service directly for repeat neuroimaging and neuro evaluation.   Meds:  Current Meds  Medication Sig  . amLODipine (NORVASC) 10 MG  tablet Take 1 tablet (10 mg total) by mouth daily. (Patient taking differently: Take 5 mg by mouth daily. )  . bicalutamide (CASODEX) 50 MG tablet Take 50 mg by mouth daily.  . hydrochlorothiazide (HYDRODIURIL) 25 MG tablet Take 1 tablet (25 mg total) by mouth daily.  . Hypromellose (ARTIFICIAL TEARS OP) Place 1 drop into the left eye daily.  Marland Kitchen losartan (COZAAR) 100 MG tablet Take 1 tablet (100 mg total) by mouth daily. Please note change in tablet dose.  . metFORMIN (GLUCOPHAGE-XR) 500 MG 24 hr tablet Take 1 tablet (500 mg total) by mouth daily with breakfast.  . omeprazole (PRILOSEC) 40 MG capsule TAKE 1 CAPSULE (40 MG TOTAL) BY MOUTH DAILY.  . pravastatin (PRAVACHOL) 20 MG tablet Take 1 tablet (20 mg total) by mouth every evening. (Patient taking differently: Take 20 mg by mouth every morning. )  . prednisoLONE acetate (PRED FORTE) 1 % ophthalmic suspension Place 1 drop into the right eye daily.    Allergies: Allergies as of 09/09/2016 - Review Complete 09/09/2016  Allergen Reaction Noted  . Aspirin Other (See Comments) 07/14/2011   Past Medical History:  Diagnosis Date  . Adenomatous colon polyp 12/14/2005   5 mm polyp in descending colon, found on colonoscopy by Dr. Wilford Corner  . Allergic rhinitis   . Arthritis   . Asymptomatic cholelithiasis 11/30/2014  . Bladder calculi   . Bladder neck contracture   .  Hand eczema   . Hepatitis, unspecified PT STATES HAD HEPATITIS APPROX 1980'S ; UNSURE WHAT TYPE   Hepatitis C RNA Quant on July 06, 2007 showed no detectable virus.  . History of bladder cancer    S/P TURBT 2010  . History of prostate cancer    S/P EXTERNAL RADIATION, SEED IMPLANTS  2009  . Hypertension   . Nocturia   . OSA on CPAP    Severe by diagnostic polysomnogram 06/22/2004.    Family History:  Family History  Problem Relation Age of Onset  . Cancer Neg Hx   . Diabetes Neg Hx   . Heart attack Mother 62    Social History:  Social History   Social  History  . Marital status: Married    Spouse name: N/A  . Number of children: N/A  . Years of education: N/A   Occupational History  . Not on file.   Social History Main Topics  . Smoking status: Former Smoker    Packs/day: 0.25    Years: 25.00    Types: Cigarettes    Quit date: 11/25/2004  . Smokeless tobacco: Never Used  . Alcohol use No  . Drug use: No  . Sexual activity: Not on file   Other Topics Concern  . Not on file   Social History Narrative  . No narrative on file   Review of Systems: A complete ROS was negative except as per HPI.   Physical Exam: Blood pressure (!) 162/83, pulse 76, temperature 97.7 F (36.5 C), temperature source Oral, resp. rate 18, height _0  (1.778 m), weight 256 lb 6.3 oz (116.3 kg), SpO2 99 %.  General appearance: Well-appearing elderly African American gentleman resting comfortably in bed, in no distress, conversational HENT: Normocephalic, atraumatic, moist mucous membranes, neck supple Eyes: PERRL, non-icteric Cardiovascular: Regular rate and rhythm, no murmurs, rubs, gallops Respiratory: Clear to auscultation bilaterally, normal work of breathing Abdomen: Obese, BS+, soft, non-tender, non-distended Extremities: Normal bulk and range of motion, no edema, 2+ peripheral pulses Skin: Warm, dry, intact Neuro: Alert and oriented, vision grossly intact, unable to abduct his left eye more than 30 degrees lateral from midline, otherwise cranial nerves grossly intact, strength, sensation, and coordination grossly intact Psych: Appropriate affect, clear speech, thoughts linear and goal-directed  Assessment & Plan by Problem:  Right cranial nerve VI palsy, worsened from 1 week ago, previous MRI at [facility] showed chronic lacunar infarctions and vascular disease, no acute infarcts, mass, or demyelination. Followed by Dr. Katy Fitch / ophtho, inflammatory markers (ESR/CRP) were normal so GCA unlikely. 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 Diona Fanti - continue Bicalutamide 50 mg daily  HTN - continue home Amlodipine 10 mg, HCTZ 25 mg, Losartan 100 mg  GERD - continue home Protonix 40 mg  HLD - continue home Pravachol 20 mg  FEN/GI: CM diet, replete electrolytes as needed  DVT ppx: Lovenox  Code status: Full code  Dispo: Admit patient to Observation with expected length of stay less than 2 midnights.  Signed: Asencion Partridge, MD 09/09/2016, 3:37 PM  Pager: [phone]

## 2016-09-09 NOTE — Progress Notes (Signed)
Pt is a direct admit from a Dr's office. A&O x3, chief complaints of double vision to right eye for a week. Denies pain.

## 2016-09-09 NOTE — Telephone Encounter (Signed)
Dr boswell calls and request assistance in obtaining a bed for pt as a direct admit for R 6 nerve palsy/ abducens palsy, medical bed, attending dr Evette Doffing, Jeri Lager, 1st resident dr Criss Alvine w/ cindy in direct admit, she will call dr groat's office at [phone] and pt's # [phone]

## 2016-09-10 ENCOUNTER — Observation Stay (HOSPITAL_COMMUNITY): Payer: Medicare PPO

## 2016-09-10 ENCOUNTER — Encounter (HOSPITAL_COMMUNITY): Payer: Self-pay | Admitting: *Deleted

## 2016-09-10 DIAGNOSIS — H4921 Sixth [abducent] nerve palsy, right eye: Secondary | ICD-10-CM | POA: Diagnosis not present

## 2016-09-10 DIAGNOSIS — E785 Hyperlipidemia, unspecified: Secondary | ICD-10-CM | POA: Diagnosis not present

## 2016-09-10 DIAGNOSIS — Z79899 Other long term (current) drug therapy: Secondary | ICD-10-CM | POA: Diagnosis not present

## 2016-09-10 DIAGNOSIS — E119 Type 2 diabetes mellitus without complications: Secondary | ICD-10-CM | POA: Diagnosis not present

## 2016-09-10 LAB — CBC
HCT: 36.2 % — ABNORMAL LOW (ref 39.0–52.0)
HEMOGLOBIN: 12.5 g/dL — AB (ref 13.0–17.0)
MCH: 30.6 pg (ref 26.0–34.0)
MCHC: 34.5 g/dL (ref 30.0–36.0)
MCV: 88.5 fL (ref 78.0–100.0)
Platelets: 145 10*3/uL — ABNORMAL LOW (ref 150–400)
RBC: 4.09 MIL/uL — AB (ref 4.22–5.81)
RDW: 12.9 % (ref 11.5–15.5)
WBC: 5.4 10*3/uL (ref 4.0–10.5)

## 2016-09-10 LAB — SEDIMENTATION RATE: SED RATE: 27 mm/h — AB (ref 0–16)

## 2016-09-10 LAB — GLUCOSE, CAPILLARY
Glucose-Capillary: 139 mg/dL — ABNORMAL HIGH (ref 65–99)
Glucose-Capillary: 166 mg/dL — ABNORMAL HIGH (ref 65–99)

## 2016-09-10 MED ORDER — GADOBENATE DIMEGLUMINE 529 MG/ML IV SOLN
20.0000 mL | Freq: Once | INTRAVENOUS | Status: AC | PRN
  Administered 2016-09-10: 20 mL via INTRAVENOUS

## 2016-09-10 NOTE — Discharge Instructions (Signed)
Diplopia Introduction Diplopia is the condition of having double vision or seeing two of a single object. There are many causes of diplopia. Some are not dangerous and can be easily corrected. Diplopia may also be a symptom of a serious medical problem. There are two types of diplopia.  Monocular diplopia. This is double vision that affects only one eye. Monocular diplopia is often caused by a clouding of the lens in your eye (cataract) or by disruptions in the way that your eye focuses light.  Binocular diplopia. This is double vision that affects both eyes. However, when you shut one eye, the double vision will go away. Binocular diplopia may be more serious. It can be caused by:  Problems with the nerves or muscles that are responsible for eye movement.  Neurologic diseases.  Thyroid problems.  Tumors.  An infection near your eyes.  A stroke. You may need to see a health care provider who specializes in eye conditions (ophthalmologist) or a nerve specialist (neurologist) to find the cause. Follow these instructions at home:  Tell your health care provider about any changes in your vision.  Do not drive or operate heavy machinery if diplopia interferes with your vision.  Keep all follow-up visits as directed by your health care provider. This is important. Contact a health care provider if:  Your diplopia gets worse.  You develop any other symptoms along with your diplopia, such as:  Weakness.  Numbness.  Headache.  Eye pain.  Clumsiness.  Nausea.  Drooping eyelids.  Abnormal movement of one of your eyes. Get help right away if:  You have sudden vision loss.  You suddenly get a very bad headache.  You have sudden weakness or numbness.  You suddenly lose the ability to speak, understand speech, or both. This information is not intended to replace advice given to you by your health care provider. Make sure you discuss any questions you have with your health  care provider. Document Released: 05/28/2004 Document Revised: 01/02/2016 Document Reviewed: 06/20/2014  2017 Elsevier

## 2016-09-10 NOTE — Care Management Note (Signed)
Case Management Note  Patient Details  Name: [name] MRN: [id number] Date of Birth: 1936/06/06  Subjective/Objective:                    Action/Plan: Confirmed with Dr. Alphonzo Grieve, discharge is for today   Expected Discharge Date:  09/10/16               Expected Discharge Plan:  Home/Self Care  In-House Referral:     Discharge planning Services     Post Acute Care Choice:    Choice offered to:     DME Arranged:    DME Agency:     HH Arranged:    [place] Agency:     Status of Service:  Completed, signed off  If discussed at [employer] of Stay Meetings, dates discussed:    Additional Comments:  Marilu Favre, RN 09/10/2016, 2:40 PM

## 2016-09-10 NOTE — Discharge Summary (Signed)
Name: [name] MRN: [id number] DOB: 04/02/1936 81 y.o. PCP: [name], MD  Date of Admission: 09/09/2016  1:45 PM Date of Discharge: 09/10/2016 Attending Physician: [name], MD  Discharge Diagnosis: 1. Right 6th Cranial Nerve Palsy Principal Problem:   Sixth cranial nerve palsy, right Active Problems:   Diabetes ([place])   Cranial nerve VI palsy, right   Discharge Medications: Allergies as of 09/10/2016      Reactions   Aspirin Other (See Comments)   GI symptoms - nausea      Medication List    TAKE these medications   amLODipine 10 MG tablet Commonly known as:  NORVASC Take 1 tablet (10 mg total) by mouth daily. What changed:  how much to take   ARTIFICIAL TEARS OP Place 1 drop into the left eye daily.   aspirin EC 81 MG tablet Take 1 tablet (81 mg total) by mouth daily.   bicalutamide 50 MG tablet Commonly known as:  CASODEX Take 50 mg by mouth daily.   hydrochlorothiazide 25 MG tablet Commonly known as:  HYDRODIURIL Take 1 tablet (25 mg total) by mouth daily.   losartan 100 MG tablet Commonly known as:  COZAAR Take 1 tablet (100 mg total) by mouth daily. Please note change in tablet dose.   metFORMIN 500 MG 24 hr tablet Commonly known as:  GLUCOPHAGE-XR Take 1 tablet (500 mg total) by mouth daily with breakfast.   omeprazole 40 MG capsule Commonly known as:  PRILOSEC TAKE 1 CAPSULE (40 MG TOTAL) BY MOUTH DAILY.   pravastatin 20 MG tablet Commonly known as:  PRAVACHOL Take 1 tablet (20 mg total) by mouth every evening. What changed:  when to take this   prednisoLONE acetate 1 % ophthalmic suspension Commonly known as:  PRED FORTE Place 1 drop into the right eye daily.       Disposition and follow-up:   Mr.Ngoc I Reaney was discharged from [facility] in Stable condition.  At the hospital follow up visit please address:  1.   --please ensure patient has not driven, operated heavy machinery,  ect --is his diplopia improving? Does the eye patch help? --is he having any other focal neurological symptoms?  2.  Labs / imaging needed at time of follow-up: none  3.  Pending labs/ test needing follow-up: none  Follow-up Appointments: Follow-up Information    GROAT,ROBERT L, MD. Schedule an appointment as soon as possible for a visit.   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 Borges presented as a direct admission from [facility] clinic for progressive right CN 6 palsy from initial symptom onset 2 weeks prior. With this, patient was experiencing significant diplopia, but otherwise no other focal neurological symptoms. During his admission, he was evaluated by neuro, with recommendations for MRI brain and orbits, as well as risk factor modification. Imaging did not show focal lesion that would explain symptoms. Throughout hospitalization, he had improvement in lateral movement. Patient was instructed to avoid driving and operating heavy machinery. Patient was instructed to use eye patch to improve symptoms of diplopia. He is to follow up with his ophthalmologist Dr. Katy Fitch.   T2DM:  Patient with newly diagnosed T2DM, recently started on metformin 548m daily. This was continued.  HTN: Patient continued on his home amlodipine 125mdaily, HCTZ 2593maily, and Losartan 100m4mily. BP well controlled during hospitalization.  HLD: Patient continued on home Pravastatin 20mg17mly. Recent LDL 92.   Prostate Cancer: Patient continued on home Bicalutamide 50mg 32my.   GERD: Patient continued on home Protonix 40mg d68m.   Discharge Vitals:   BP (!) 142/77   Pulse 72   Temp 98 F (36.7 C) (Oral)   Resp 18    Ht '5\' 10"'  (1.778 m)   Wt 256 lb 6.3 oz (116.3 kg)   SpO2 99%   BMI 36.79 kg/m   Pertinent Labs, Studies, and Procedures:  CBC    Component Value Date/Time   WBC 5.4 09/10/2016 0436   RBC 4.09 (L) 09/10/2016 0436   HGB 12.5 (L) 09/10/2016 0436   HCT 36.2 (L) 09/10/2016 0436   PLT 145 (L) 09/10/2016 0436   MCV 88.5 09/10/2016 0436   MCH 30.6 09/10/2016 0436   MCHC 34.5 09/10/2016 0436   RDW 12.9 09/10/2016 0436   LYMPHSABS 2.8 10/30/2014 1006   MONOABS 0.5 10/30/2014 1006   EOSABS 0.2 10/30/2014 1006   EOSABS 0.2 K/UL 06/03/2006 1656   BASOSABS 0.0 10/30/2014 1006   ESR 09/10/2016: 27  MRI brain w/wo contrast 09/10/2016: No acute intracranial process. Mild-mod chronic small vessel ischemic disease.  MRI orbits 09/10/2016: No acute findings. S/p right ocular lens implant.   Discharge Instructions: Discharge Instructions    Call MD for:  difficulty breathing, headache or visual disturbances    Complete by:  As directed    Call MD for:  extreme fatigue    Complete by:  As directed    Call MD for:  persistant dizziness or light-headedness    Complete by:  As directed    Call MD for:  persistant nausea and vomiting    Complete by:  As directed    Call MD for:  redness, tenderness, or signs of infection (pain, swelling, redness, odor or green/yellow discharge around incision site)    Complete by:  As directed    Call MD for:  severe uncontrolled pain    Complete by:  As directed    Call MD for:  temperature >100.4    Complete by:  As directed    Diet - low sodium heart healthy    Complete by:  As directed    Discharge instructions    Complete by:  As directed    Due to your double vision, please do not drive, handle heavy machinery, or climb ladders.  Pick up an eye patch at your local pharmacy and wear it over one eye to help with double vision.   Follow up with Dr. Groat iKaty Fitch weeks.   Increase activity slowly    Complete by:  As directed       Signed: Raschelle Wisenbaker Alphonzo Grieve1/2018, 5:35 PM   Pager [phone]

## 2016-09-10 NOTE — Progress Notes (Signed)
   Subjective:  No acute events or complaints today. He is worried about not being able to drive and work. He is in agreement with discharge today; discussed with him imaging results and expectations for recovery. No further questions.  Objective:  Vital signs in last 24 hours: Vitals:   09/09/16 1412 09/09/16 2044 09/10/16 0642 09/10/16 1000  BP: (!) 162/83 (!) 142/75 125/63 (!) 142/77  Pulse: 76 74 72   Resp: 18 17 18    Temp: 97.7 F (36.5 C) 98.1 F (36.7 C) 98 F (36.7 C)   TempSrc: Oral Oral Oral   SpO2: 99% 98% 99%   Weight: 256 lb 6.3 oz (116.3 kg)     Height: 5\' 10"  (1.778 m)      Constitutional: NAD, VS reviewed Neuro: Right eye lateral movement limited to about 30-45 degrees past midline which appears to be improvement from yesterday; otherwise EOMI and CN intact CV: RRR Resp: CTAB, no increased work of breathing MSK: moves all extremities freely  Assessment/Plan:  Principal Problem:   Sixth cranial nerve palsy, right Active Problems:   Diabetes (HCC)   Cranial nerve VI palsy, right  Right CN VI palsy: MRI brain and orbit negative for lesions that could be contributing. In setting of new DM, might have had microinfarct that led to palsy. Neuro consulted - in leu of neg imaging, predict some recovery over time; no further neuro follow up indicated.  --will have patient get patch to help with diplopia - no driving --f/u with Dr. Katy Fitch  T2DM: --continue metformin 500mg  daily per PCP  Prostate cancer: --continue Bicalutamide 50mg  daily  HTN:  --continue amlodipine 10mg  daily, HCTZ 25mg  daily, Losartan 100mg  daily  HLD: --continue pravastatin 20mg  daily  GERD: --continue protonix 40mg  daily  Dispo: Anticipated discharge today.   Alphonzo Grieve, MD 09/10/2016, 1:01 PM Pager [phone]

## 2016-09-11 ENCOUNTER — Ambulatory Visit: Payer: Self-pay | Admitting: Neurology

## 2016-10-04 ENCOUNTER — Other Ambulatory Visit: Payer: Self-pay | Admitting: Student in an Organized Health Care Education/Training Program

## 2016-10-04 DIAGNOSIS — E119 Type 2 diabetes mellitus without complications: Secondary | ICD-10-CM

## 2016-10-29 ENCOUNTER — Encounter: Payer: Self-pay | Admitting: Oncology

## 2016-10-29 ENCOUNTER — Telehealth: Payer: Self-pay | Admitting: Oncology

## 2016-10-29 NOTE — Telephone Encounter (Signed)
Appt has been scheduled with the pt's spouse to see Dr. Alen Blew on 4/6 at [facility] to arrive 30 minutes early. Verified address. Letter mailed.

## 2016-11-12 ENCOUNTER — Telehealth: Payer: Self-pay | Admitting: *Deleted

## 2016-11-12 NOTE — Telephone Encounter (Signed)
Tried calling patient on home and cell phone but no answer. Was reminding him of his new patient appointment with Dr. Alen Blew tomorrow at 10:30 am. If patient calls back, he needs to get this message.

## 2016-11-13 ENCOUNTER — Ambulatory Visit (HOSPITAL_BASED_OUTPATIENT_CLINIC_OR_DEPARTMENT_OTHER): Payer: Medicare PPO | Admitting: Oncology

## 2016-11-13 ENCOUNTER — Telehealth: Payer: Self-pay | Admitting: Oncology

## 2016-11-13 VITALS — BP 144/69 | HR 69 | Temp 97.9°F | Resp 20 | Ht 70.0 in | Wt 254.2 lb

## 2016-11-13 DIAGNOSIS — C7951 Secondary malignant neoplasm of bone: Principal | ICD-10-CM

## 2016-11-13 DIAGNOSIS — Z87891 Personal history of nicotine dependence: Secondary | ICD-10-CM

## 2016-11-13 DIAGNOSIS — E291 Testicular hypofunction: Secondary | ICD-10-CM | POA: Diagnosis not present

## 2016-11-13 DIAGNOSIS — C61 Malignant neoplasm of prostate: Secondary | ICD-10-CM | POA: Diagnosis not present

## 2016-11-13 DIAGNOSIS — R350 Frequency of micturition: Secondary | ICD-10-CM

## 2016-11-13 DIAGNOSIS — R351 Nocturia: Secondary | ICD-10-CM | POA: Diagnosis not present

## 2016-11-13 NOTE — Progress Notes (Signed)
Reason for Referral: Prostate cancer.   HPI: 81 year old gentleman currently of [place] where he lived the majority of his life. He has history of prostate cancer dates back to 2009. At that time his PSA was 3.2 after increased from 0.9. The biopsy at that time showed a Gleason score 4+3 = 7 as well as 4+4 = 8 patterns. Given his high risk disease he was treated with androgen deprivation as well as definitive radiation therapy. His PSA nadir was 0.5 in 2009. His PSA continues to rise and in 2013 his PSA was 6.42. Biopsy taken at that time which showed no evidence of recurrent disease. Bone scan in June 2014 did not really show any evidence of metastatic disease. His PSA in February 2015 was up to 36.35 and a fluoride PET scan showed measurable disease in the second rib as well as the 10th thoracic vertebral body. He was started with Mills Koller and has been on Lupron since that time. He had an excellent PSA response initially with a nadir down to 0.08 in April 2016. His PSA was 0.8 in February 2017 and Casodex was added. In April 2017 his PSA was 0.28 and started to rise again in July 2017. His PSA was 0.44 and subsequently was 1.09 in October 2017. On 10/09/2016 his PSA was 3.39. His testosterone level was 30.4 at that time. His most recent bone scan in August 2017.  Clinically he is asymptomatic. He does report urinary frequency and nocturia but self catheterizes regularly without complications. He denied any back pain shoulder pain. He denied any decline in his energy her performance status. Continues to be reasonably active and attends activities of daily living. His appetite have also been normal and weight is stable.  He does not report any headaches, blurry vision, syncope or seizures. He does not report any fevers, chills or sweats. He does not report any cough, wheezing or hemoptysis. He is not reporting nausea, vomiting or abdominal pain. He does not report any chest pain, palpitation, orthopnea or  leg edema. He does not report any hematuria, dysuria. He does not report any flank pain, ecchymosis or rash. He does not report lymphadenopathy. Remaining review of systems unremarkable.   Past Medical History:  Diagnosis Date  . Adenomatous colon polyp 12/14/2005   5 mm polyp in descending colon, found on colonoscopy by Dr. Wilford Corner  . Allergic rhinitis   . Asymptomatic cholelithiasis 11/30/2014  . Bladder calculi   . Bladder cancer ([facility]) 2010   S/P TURBT   . Bladder neck contracture   . Enlarged heart    "slight" (09/09/2016)  . GERD (gastroesophageal reflux disease)   . Hand eczema   . Hepatitis, unspecified PT STATES HAD HEPATITIS APPROX 1980'S ; UNSURE WHAT TYPE   Hepatitis C RNA Quant on July 06, 2007 showed no detectable virus.  . High cholesterol   . History of kidney stones   . Hypertension   . Nocturia   . OSA on CPAP    Severe by diagnostic polysomnogram 06/22/2004.  Marland Kitchen Prostate cancer ([place]) 2009   S/P EXTERNAL RADIATION, SEED IMPLANTS  . Self-catheterizes urinary bladder    "q couple weeks prn" (09/09/2016)  . Sleep apnea    "don't use the mask anymore" (09/09/2016)  :  Past Surgical History:  Procedure Laterality Date  . CARDIOVASCULAR STRESS TEST  07-14-2011  DR NISHAN   NORMAL NUCLEAR STUDY/ EF 62%  . CIRCUMCISION N/A 04/08/2015   Procedure: CIRCUMCISION ADULT;  Surgeon: Franchot Gallo, MD;  Location: [facility];  Service: Urology;  Laterality: N/A;  . CYSTOSCOPY WITH LITHOLAPAXY N/A 10/24/2012   Procedure: CYSTOSCOPY WITH LITHOLAPAXY;  Surgeon: Franchot Gallo, MD;  Location: [facility];  Service: Urology;  Laterality: N/A;  TRANSURETHRAL RESECTION OF BLADDER NECK CONTRACTURE WITH COLLINS KNIFE     . CYSTOSCOPY WITH URETHRAL DILATATION N/A 04/08/2015   Procedure: CYSTOSCOPY WITH BALLOON DILATATION OF BLADDER NECK CONTRACTURE;  Surgeon: Franchot Gallo, MD;  Location: [facility];  Service: Urology;  Laterality: N/A;  . KNEE ARTHROSCOPY  Bilateral 1970's  . RADIOACTIVE PROSTATE SEED IMPLANTS  04-11-2008  . TRANSURETHRAL RESECTION OF BLADDER NECK  12-19-2008  . TRANSURETHRAL RESECTION OF BLADDER TUMOR  02-19-2009   AND TUR BLADDER NECK  :   Current Outpatient Prescriptions:  .  amLODipine (NORVASC) 10 MG tablet, Take 1 tablet (10 mg total) by mouth daily. (Patient taking differently: Take 5 mg by mouth daily. ), Disp: 90 tablet, Rfl: 3 .  aspirin EC 81 MG tablet, Take 1 tablet (81 mg total) by mouth daily., Disp: 150 tablet, Rfl: 2 .  bicalutamide (CASODEX) 50 MG tablet, Take 50 mg by mouth daily., Disp: , Rfl: 3 .  hydrochlorothiazide (HYDRODIURIL) 25 MG tablet, Take 1 tablet (25 mg total) by mouth daily., Disp: 90 tablet, Rfl: 3 .  Hypromellose (ARTIFICIAL TEARS OP), Place 1 drop into the left eye daily., Disp: , Rfl:  .  losartan (COZAAR) 100 MG tablet, Take 1 tablet (100 mg total) by mouth daily. Please note change in tablet dose., Disp: 90 tablet, Rfl: 3 .  metFORMIN (GLUCOPHAGE-XR) 500 MG 24 hr tablet, TAKE 1 TABLET (500 MG TOTAL) BY MOUTH DAILY WITH BREAKFAST., Disp: 30 tablet, Rfl: 5 .  omeprazole (PRILOSEC) 40 MG capsule, TAKE 1 CAPSULE (40 MG TOTAL) BY MOUTH DAILY., Disp: 30 capsule, Rfl: 5 .  pravastatin (PRAVACHOL) 20 MG tablet, Take 1 tablet (20 mg total) by mouth every evening. (Patient taking differently: Take 20 mg by mouth every morning. ), Disp: 90 tablet, Rfl: 3 .  prednisoLONE acetate (PRED FORTE) 1 % ophthalmic suspension, Place 1 drop into the right eye daily., Disp: , Rfl: :  Allergies  Allergen Reactions  . Aspirin Nausea Only    Patient stated,"I get nauseated with higher doses of Aspirin."  :  Family History  Problem Relation Age of Onset  . Heart attack Mother 18  . Cancer Neg Hx   . Diabetes Neg Hx   :  Social History   Social History  . Marital status: Married    Spouse name: N/A  . Number of children: N/A  . Years of education: N/A   Occupational History  . Not on file.    Social History Main Topics  . Smoking status: Former Smoker    Packs/day: 0.25    Years: 25.00    Types: Cigarettes    Quit date: 11/25/2004  . Smokeless tobacco: Never Used  . Alcohol use No  . Drug use: No  . Sexual activity: No   Other Topics Concern  . Not on file   Social History Narrative  . No narrative on file  :  Pertinent items are noted in HPI.  Exam: Blood pressure (!) 144/69, pulse 69, temperature 97.9 F (36.6 C), temperature source Oral, resp. rate 20, height 5\' 10"  (1.778 m), weight 254 lb 3.2 oz (115.3 kg), SpO2 98 %.  ECOG 0  General appearance: alert and cooperative appeared without distress. Head: Normocephalic, without obvious abnormality Throat: lips, mucosa,  and tongue normal; teeth and gums normal Neck: no adenopathy Back: negative Resp: clear to auscultation bilaterally rhonchi, wheezes or dullness to percussion. Chest wall: no tenderness Cardio: regular rate and rhythm, S1, S2 normal, no murmur, click, rub or gallop GI: soft, non-tender; bowel sounds normal; no masses,  no organomegaly Extremities: extremities normal, atraumatic, no cyanosis or edema  CBC    Component Value Date/Time   WBC 5.4 09/10/2016 0436   RBC 4.09 (L) 09/10/2016 0436   HGB 12.5 (L) 09/10/2016 0436   HCT 36.2 (L) 09/10/2016 0436   PLT 145 (L) 09/10/2016 0436   MCV 88.5 09/10/2016 0436   MCH 30.6 09/10/2016 0436   MCHC 34.5 09/10/2016 0436   RDW 12.9 09/10/2016 0436   LYMPHSABS 2.8 10/30/2014 1006   MONOABS 0.5 10/30/2014 1006   EOSABS 0.2 10/30/2014 1006   EOSABS 0.2 K/UL 06/03/2006 1656   BASOSABS 0.0 10/30/2014 1006     Chemistry      Component Value Date/Time   NA 137 09/09/2016 1501   NA 140 06/01/2016 1028   K 3.7 09/09/2016 1501   CL 104 09/09/2016 1501   CO2 23 09/09/2016 1501   BUN 18 09/09/2016 1501   BUN 10 06/01/2016 1028   CREATININE 1.18 09/09/2016 1501   CREATININE 0.98 10/30/2014 1006      Component Value Date/Time   CALCIUM 10.0  09/09/2016 1501   ALKPHOS 53 10/30/2014 1006   AST 74 (H) 10/30/2014 1006   ALT 83 (H) 10/30/2014 1006   BILITOT 0.5 10/30/2014 1006       Assessment and Plan:   81 year old gentleman with the following issues:  1. Prostate cancer diagnosed in March 2009. At that time his PSA was 3.2 and a Gleason score 4+4 = 8. He received definitive radiotherapy with androgen deprivation in addition to radiation therapy and seed implants. He had a PSA nadir to 0.5 in September 2009.  His PSA started to rise again slowly over the last 6 years and in February 2015 his PSA was 36.35. Androgen deprivation was started initially with Mills Koller and has been on Lupron since that time. Casodex was added in February 2017 for a small rise in his PSA. Recent PSA in 10/09/2016 was 3.39 after a rise from October 2017 of 1.09. His testosterone at that time was adequately castrate.  The natural course of this disease was discussed today with the patient and his wife. 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 He understands any treatment option in the future would be palliative and will not be curative. He appears to be asymptomatic at this time with rather slow progression in his PSA. Treatment options would include second line hormonal therapy with Nicki Reaper or immunotherapy in the form of Provenge. Systemic chemotherapy is also an option but no clearcut indication is needed at this time.  The first up at this time is to stop Casodex and proceed with antiandrogen withdrawal and restage him at that time. If his PSA continues to rise and depending on his bone scan results therapy will be determined. He'll be a good candidate for Xtandi if his PSA continues to rise despite stopping Casodex. Systemic chemotherapy can be used if he has widespread metastatic disease.  He is to stop Casodex immediately and we'll repeat his PSA  in 6 weeks. He will also obtain a bone scan at that time.  2. Androgen deprivation: He is to  continue that indefinitely.  3. Follow-up: Will be in 6 weeks after repeating his PSA and bone scan.

## 2016-11-13 NOTE — Telephone Encounter (Signed)
Appointments scheduled per 4.6.18 LOS. Patient given AVS report and calendars with future scheduled appointments. °

## 2016-11-30 ENCOUNTER — Ambulatory Visit (INDEPENDENT_AMBULATORY_CARE_PROVIDER_SITE_OTHER): Payer: Medicare PPO | Admitting: Student in an Organized Health Care Education/Training Program

## 2016-11-30 ENCOUNTER — Encounter: Payer: Self-pay | Admitting: Student in an Organized Health Care Education/Training Program

## 2016-11-30 VITALS — BP 140/74 | HR 65 | Temp 97.7°F | Ht 70.0 in | Wt 254.7 lb

## 2016-11-30 DIAGNOSIS — Z7984 Long term (current) use of oral hypoglycemic drugs: Secondary | ICD-10-CM

## 2016-11-30 DIAGNOSIS — Z87891 Personal history of nicotine dependence: Secondary | ICD-10-CM

## 2016-11-30 DIAGNOSIS — I10 Essential (primary) hypertension: Secondary | ICD-10-CM

## 2016-11-30 DIAGNOSIS — G4733 Obstructive sleep apnea (adult) (pediatric): Secondary | ICD-10-CM

## 2016-11-30 DIAGNOSIS — H9190 Unspecified hearing loss, unspecified ear: Secondary | ICD-10-CM

## 2016-11-30 DIAGNOSIS — R0609 Other forms of dyspnea: Secondary | ICD-10-CM | POA: Diagnosis not present

## 2016-11-30 DIAGNOSIS — Z79899 Other long term (current) drug therapy: Secondary | ICD-10-CM | POA: Diagnosis not present

## 2016-11-30 DIAGNOSIS — E119 Type 2 diabetes mellitus without complications: Secondary | ICD-10-CM | POA: Diagnosis not present

## 2016-11-30 DIAGNOSIS — H833X3 Noise effects on inner ear, bilateral: Secondary | ICD-10-CM

## 2016-11-30 DIAGNOSIS — H9193 Unspecified hearing loss, bilateral: Secondary | ICD-10-CM | POA: Diagnosis not present

## 2016-11-30 DIAGNOSIS — L84 Corns and callosities: Secondary | ICD-10-CM

## 2016-11-30 HISTORY — DX: Unspecified hearing loss, unspecified ear: H91.90

## 2016-11-30 LAB — POCT GLYCOSYLATED HEMOGLOBIN (HGB A1C): Hemoglobin A1C: 6.2

## 2016-11-30 LAB — GLUCOSE, CAPILLARY: GLUCOSE-CAPILLARY: 85 mg/dL (ref 65–99)

## 2016-11-30 NOTE — Assessment & Plan Note (Signed)
Patient with progressive dyspnea with exertion that is starting to impact his functional life. He also has moderate orthopnea at night. No history of heart disease but he does have risk factors of diabetes. On exam he has no crackles, some pitting in his lower extremities, and very mild JVD. Plan is to rule out heart failure with reduced ejection fraction with an echocardiogram.

## 2016-11-30 NOTE — Assessment & Plan Note (Signed)
Blood pressure is well controlled on exam today. Plan is to continue HCTZ, amlodipine, and losartan. I would over his medication bottles with him very carefully today.

## 2016-11-30 NOTE — Assessment & Plan Note (Signed)
Hemoglobin A1c 6.2% today which is at goal. Plan is to continue with metformin 500 mg once daily. On foot exam is noted to have a callus on the bottom of the left foot and a callus on the second toe of the right foot. Given this we will refer him to podiatry. Plan to continue aspirin and pravastatin for secondary prevention.

## 2016-11-30 NOTE — Assessment & Plan Note (Signed)
Severe sleep apnea diagnosed on polysomnography on 2005. Has been intolerant of CPAP for the last several years. Now he is having more nocturnal apneic events and would like to try to restart. I advised him to try to find his machine at home and see if it still works. Follow-up with the home health agency. If he needs a new machine or upgrades, he will likely need a new updated sleep study which I can order. Patient will be in touch with me for follow-up based on what the home health agency tells him.

## 2016-11-30 NOTE — Assessment & Plan Note (Signed)
Chronic and progressive bilateral hearing loss which is starting to negatively impact his functioning. He has reduce hearing on finger rub bilaterally in the office. No wax and normal TMs on exam. Likely age related hearing loss. Plan is to refer to ENT for audiology testing and hearing aids.
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
## 2016-11-30 NOTE — Patient Instructions (Signed)
1. Please double check your medications at home with the list I gave you today.  2. I have referred you to ENT who will test your hearing and fit you for hearing aids if you qualify.   3. I have ordered an ultrasound of your heart (Echo) to be done sometime in the next few weeks. This is too look for a cause of your shortness of breath.   4. Please try to find and use your CPAP machine. If it does not work or does not fit well, call the home health company for maintenance or replacement. Let me know if they need me to fill out any paperwork.

## 2016-12-01 ENCOUNTER — Telehealth: Payer: Self-pay | Admitting: *Deleted

## 2016-12-01 NOTE — Telephone Encounter (Signed)
CALLED AND SPOKE WITH PATIENT ,THEN SPOKE WITH MRS. TINA (WIFE), GAVE HER MR. Crowl APPOINTMENT WITH ENT-DR BATES / MAY 16.018 / ARRIVE 9:10AM / [facility] ? [phone]. WIFE WAS ABLE TO READ BACK TO ME THE ABOVE INFORMATION.

## 2016-12-16 ENCOUNTER — Ambulatory Visit: Payer: Self-pay | Admitting: Podiatry

## 2016-12-24 ENCOUNTER — Ambulatory Visit (HOSPITAL_COMMUNITY)
Admission: RE | Admit: 2016-12-24 | Discharge: 2016-12-24 | Disposition: A | Discharge status: Home / Self Care | Payer: Medicare PPO | Source: Ambulatory Visit | Attending: Student in an Organized Health Care Education/Training Program | Admitting: Student in an Organized Health Care Education/Training Program

## 2016-12-24 DIAGNOSIS — R0609 Other forms of dyspnea: Secondary | ICD-10-CM | POA: Diagnosis not present

## 2016-12-24 NOTE — Progress Notes (Signed)
  Echocardiogram 2D Echocardiogram has been performed.  Johny Chess 12/24/2016, 4:07 PM

## 2016-12-25 ENCOUNTER — Other Ambulatory Visit (HOSPITAL_BASED_OUTPATIENT_CLINIC_OR_DEPARTMENT_OTHER): Payer: Medicare PPO

## 2016-12-25 DIAGNOSIS — C61 Malignant neoplasm of prostate: Secondary | ICD-10-CM | POA: Diagnosis not present

## 2016-12-25 DIAGNOSIS — C7951 Secondary malignant neoplasm of bone: Principal | ICD-10-CM

## 2016-12-25 LAB — CBC WITH DIFFERENTIAL/PLATELET
BASO%: 0.5 % (ref 0.0–2.0)
BASOS ABS: 0 10*3/uL (ref 0.0–0.1)
EOS%: 3.9 % (ref 0.0–7.0)
Eosinophils Absolute: 0.2 10*3/uL (ref 0.0–0.5)
HEMATOCRIT: 39.9 % (ref 38.4–49.9)
HGB: 13.6 g/dL (ref 13.0–17.1)
LYMPH#: 2.8 10*3/uL (ref 0.9–3.3)
LYMPH%: 47 % (ref 14.0–49.0)
MCH: 31.2 pg (ref 27.2–33.4)
MCHC: 34 g/dL (ref 32.0–36.0)
MCV: 91.6 fL (ref 79.3–98.0)
MONO#: 0.6 10*3/uL (ref 0.1–0.9)
MONO%: 9.4 % (ref 0.0–14.0)
NEUT#: 2.3 10*3/uL (ref 1.5–6.5)
NEUT%: 39.2 % (ref 39.0–75.0)
PLATELETS: 172 10*3/uL (ref 140–400)
RBC: 4.36 10*6/uL (ref 4.20–5.82)
RDW: 13.3 % (ref 11.0–14.6)
WBC: 5.9 10*3/uL (ref 4.0–10.3)

## 2016-12-25 LAB — COMPREHENSIVE METABOLIC PANEL
ALT: 64 U/L — AB (ref 0–55)
ANION GAP: 12 meq/L — AB (ref 3–11)
AST: 45 U/L — ABNORMAL HIGH (ref 5–34)
Albumin: 4 g/dL (ref 3.5–5.0)
Alkaline Phosphatase: 66 U/L (ref 40–150)
BUN: 14.5 mg/dL (ref 7.0–26.0)
CALCIUM: 9.9 mg/dL (ref 8.4–10.4)
CHLORIDE: 106 meq/L (ref 98–109)
CO2: 23 meq/L (ref 22–29)
Creatinine: 1.1 mg/dL (ref 0.7–1.3)
EGFR: 75 mL/min/{1.73_m2} — AB (ref >90)
Glucose: 163 mg/dl — ABNORMAL HIGH (ref 70–140)
POTASSIUM: 3.8 meq/L (ref 3.5–5.1)
Sodium: 142 mEq/L (ref 136–145)
Total Bilirubin: 0.41 mg/dL (ref 0.20–1.20)
Total Protein: 7.9 g/dL (ref 6.4–8.3)

## 2016-12-26 LAB — PSA: Prostate Specific Ag, Serum: 4.9 ng/mL — ABNORMAL HIGH (ref 0.0–4.0)

## 2016-12-28 ENCOUNTER — Encounter (HOSPITAL_COMMUNITY): Payer: Medicare PPO

## 2016-12-28 ENCOUNTER — Telehealth: Payer: Self-pay | Admitting: Student in an Organized Health Care Education/Training Program

## 2016-12-28 DIAGNOSIS — G4733 Obstructive sleep apnea (adult) (pediatric): Secondary | ICD-10-CM

## 2016-12-28 NOTE — Telephone Encounter (Signed)
I spoke to [name] about echo result being normal. Shortness of breath is stable. Next step in this work up I think is to get him back on CPAP for his known OSA. Last sleep study was 2005, he does not have a working machine right now. I am ordering a new split night sleep study so we can re-order the CPAP machine.

## 2016-12-29 ENCOUNTER — Ambulatory Visit (HOSPITAL_BASED_OUTPATIENT_CLINIC_OR_DEPARTMENT_OTHER): Payer: Medicare PPO | Admitting: Oncology

## 2016-12-29 ENCOUNTER — Telehealth: Payer: Self-pay | Admitting: Oncology

## 2016-12-29 VITALS — BP 111/74 | HR 57 | Temp 98.0°F | Resp 17 | Ht 70.0 in | Wt 250.0 lb

## 2016-12-29 DIAGNOSIS — C61 Malignant neoplasm of prostate: Secondary | ICD-10-CM

## 2016-12-29 DIAGNOSIS — E291 Testicular hypofunction: Secondary | ICD-10-CM | POA: Diagnosis not present

## 2016-12-29 DIAGNOSIS — C7951 Secondary malignant neoplasm of bone: Principal | ICD-10-CM

## 2016-12-29 NOTE — Telephone Encounter (Signed)
Gave patient AVS and calender per 5/22 los.

## 2016-12-29 NOTE — Progress Notes (Signed)
Hematology and Oncology Follow Up Visit  [name] [id number] 1935/08/15 81 y.o. 12/29/2016 9:22 AM [name], MDVincent, [name],*   Principle Diagnosis: 81 year old gentleman with prostate cancer diagnosed in 2009. His PSA was 3.2 and a Gleason score of 8. 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   Prior Therapy: He was treated with androgen deprivation as well as definitive radiation therapy. His PSA nadir was 0.5 in 2009.   His PSA continues to rise and in 2013 his PSA was 6.42. Biopsy taken at that time which showed no evidence of recurrent disease. Bone scan in June 2014 did not really show any evidence of metastatic disease.   His PSA in February 2015 was up to 36.35 and a fluoride PET scan showed measurable disease in the second rib as well as the 10th thoracic vertebral body. He was started with Mills Koller and has been on Lupron since that time. He had an excellent PSA response initially with a nadir down to 0.08 in April 2016.   His PSA was 0.8 in February 2017 and Casodex was added. In April 2017 his PSA was 0.28 and started to rise again in July 2017. His PSA was 0.44 and subsequently was 1.09 in October 2017. On 10/09/2016 his PSA was 3.39. His testosterone level was 30.4. Casodex was discontinued in April 2018.   Current therapy: Antiandrogen withdrawal and consideration for cycle on hormone therapy.  Interim History: [name] presents today for a follow-up visit. Since the last visit, he reports no recent complaints. He remains active and continues to attend to activities of daily living. He does report some mild dyspnea on exertion related to his weight and deconditioning. His appetite is excellent and does not report any bone pain. He does not report any pathological fractures or falls. His quality of life remains excellent.  He does not report any headaches, blurry  vision, syncope or seizures. He does not report any fevers, chills or sweats. He does not report any cough, wheezing or hemoptysis. He is not reporting nausea, vomiting or abdominal pain. He does not report any chest pain, palpitation, orthopnea or leg edema. He does not report any hematuria, dysuria. He does not report any flank pain, ecchymosis or rash. He does not report lymphadenopathy. Remaining review of systems unremarkable.   Medications: I have reviewed the patient's current medications.  Current Outpatient Prescriptions  Medication Sig Dispense Refill  . amLODipine (NORVASC) 10 MG tablet Take 1 tablet (10 mg total) by mouth daily. (Patient taking differently: Take 5 mg by mouth daily. ) 90 tablet 3  . aspirin EC 81 MG tablet Take 1 tablet (81 mg total) by mouth daily. 150 tablet 2  . hydrochlorothiazide (HYDRODIURIL) 25 MG tablet Take 1 tablet (25 mg total) by mouth daily. 90 tablet 3  . Hypromellose (ARTIFICIAL TEARS OP) Place 1 drop into the left eye daily.    Marland Kitchen losartan (COZAAR) 100 MG tablet Take 1 tablet (100 mg total) by mouth daily. Please note change in tablet dose. 90 tablet 3  . metFORMIN (GLUCOPHAGE-XR) 500 MG 24 hr tablet TAKE 1 TABLET (500 MG TOTAL) BY MOUTH DAILY WITH BREAKFAST. 30 tablet 5  . omeprazole (PRILOSEC) 40 MG capsule TAKE 1 CAPSULE (40 MG TOTAL) BY MOUTH DAILY. 30 capsule 5  . pravastatin (PRAVACHOL) 20 MG tablet Take 1 tablet (20 mg total) by mouth every evening. (Patient taking differently: Take 20 mg by  mouth every morning. ) 90 tablet 3  . prednisoLONE acetate (PRED FORTE) 1 % ophthalmic suspension Place 1 drop into the right eye daily.     No current facility-administered medications for this visit.      Allergies:  Allergies  Allergen Reactions  . Aspirin Nausea Only    Patient stated,"I get nauseated with higher doses of Aspirin."    Past Medical History, Surgical history, Social history, and Family History were reviewed and updated.  Physical  Exam: Blood pressure 111/74, pulse (!) 57, temperature 98 F (36.7 C), temperature source Oral, resp. rate 17, height 5\' 10"  (1.778 m), weight 250 lb (113.4 kg), SpO2 100 %. ECOG: 1 General appearance: alert and cooperative Head: Normocephalic, without obvious abnormality Neck: no adenopathy Lymph nodes: Cervical, supraclavicular, and axillary nodes normal. Heart:regular rate and rhythm, S1, S2 normal, no murmur, click, rub or gallop Lung:chest clear, no wheezing, rales, normal symmetric air entry Abdomin: soft, non-tender, without masses or organomegaly EXT:no erythema, induration, or nodules   Lab Results: Lab Results  Component Value Date   WBC 5.9 12/25/2016   HGB 13.6 12/25/2016   HCT 39.9 12/25/2016   MCV 91.6 12/25/2016   PLT 172 12/25/2016     Chemistry      Component Value Date/Time   NA 142 12/25/2016 0812   K 3.8 12/25/2016 0812   CL 104 09/09/2016 1501   CO2 23 12/25/2016 0812   BUN 14.5 12/25/2016 0812   CREATININE 1.1 12/25/2016 0812      Component Value Date/Time   CALCIUM 9.9 12/25/2016 0812   ALKPHOS 66 12/25/2016 0812   AST 45 (H) 12/25/2016 0812   ALT 64 (H) 12/25/2016 0812   BILITOT 0.41 12/25/2016 0812     Results for EROS, MONTOUR I (MRN [id number]) as of 12/29/2016 09:10  Ref. Range 12/25/2016 08:12  PSA Latest Ref Range: 0.0 - 4.0 ng/mL 4.9 (H)     Impression and Plan:   81 year old gentleman with the following issues:  1. Prostate cancer diagnosed in March 2009. At that time his PSA was 3.2 and a Gleason score 4+4 = 8. He received definitive radiotherapy with androgen deprivation in addition to radiation therapy and seed implants. He had a PSA nadir to 0.5 in September 2009.  His PSA started to rise again slowly over the last 6 years and in February 2015 his PSA was 36.35. Androgen deprivation was started initially with Mills Koller and has been on Lupron since that time. Casodex was added in February 2017 for a small rise in his PSA. Recent  PSA in 10/09/2016 was 3.39 after a rise from October 2017 of 1.09. His testosterone at that time was adequately castrate.   The natural course of this disease was reviewed again with the patient and he appears to have castration resistant disease with biochemical relapse only and possibly measurable disease. His PSA after discontinuing Casodex have not dramatically changed at 4.9. His bone scan is currently pending.  The plan is to await his bone scan results and consider these options. These would include second line hormone therapy with Nicki Reaper or Apalutamide. Continued observation would be also an option for him given his low volume disease and minimal changes PSA. For the time being she prefers to continue to monitor I will consider adding sickle on hormone therapy in the immediate future.  2. Androgen deprivation therapy: This will be continued indefinitely.  3. Follow-up: Will be in August 2018 to repeat his PSA.    Khila Papp,  MD 5/22/20189:22 AM

## 2016-12-30 ENCOUNTER — Encounter: Payer: Self-pay | Admitting: Podiatry

## 2016-12-30 ENCOUNTER — Ambulatory Visit (INDEPENDENT_AMBULATORY_CARE_PROVIDER_SITE_OTHER): Payer: Medicare PPO | Admitting: Podiatry

## 2016-12-30 VITALS — BP 148/85

## 2016-12-30 DIAGNOSIS — M79676 Pain in unspecified toe(s): Secondary | ICD-10-CM

## 2016-12-30 DIAGNOSIS — B351 Tinea unguium: Secondary | ICD-10-CM | POA: Diagnosis not present

## 2016-12-30 DIAGNOSIS — L84 Corns and callosities: Secondary | ICD-10-CM | POA: Diagnosis not present

## 2017-01-01 ENCOUNTER — Encounter (HOSPITAL_COMMUNITY)
Admission: RE | Admit: 2017-01-01 | Discharge: 2017-01-01 | Disposition: A | Discharge status: Home / Self Care | Payer: Medicare PPO | Source: Ambulatory Visit | Attending: Oncology | Admitting: Oncology

## 2017-01-01 DIAGNOSIS — C61 Malignant neoplasm of prostate: Secondary | ICD-10-CM | POA: Insufficient documentation

## 2017-01-01 DIAGNOSIS — C7951 Secondary malignant neoplasm of bone: Secondary | ICD-10-CM | POA: Insufficient documentation

## 2017-01-01 MED ORDER — TECHNETIUM TC 99M MEDRONATE IV KIT
25.0000 | PACK | Freq: Once | INTRAVENOUS | Status: AC | PRN
  Administered 2017-01-01: 21.8 via INTRAVENOUS

## 2017-01-01 NOTE — Progress Notes (Signed)
   SUBJECTIVE Patient with a history of diabetes mellitus presents to office today complaining of elongated, thickened nails. Pain while ambulating in shoes. Patient is unable to trim their own nails. He also reports a sore callus to the right second toe that has been present for the past year.  OBJECTIVE General Patient is awake, alert, and oriented x 3 and in no acute distress. Derm Skin is dry and supple bilateral. Negative open lesions or macerations. Remaining integument unremarkable. Nails are tender, long, thickened and dystrophic with subungual debris, consistent with onychomycosis, 1-5 bilateral. No signs of infection noted. Hyperkeratotic lesion present on the right second toe. Pain on palpation with a central nucleated core noted. Vasc  DP and PT pedal pulses palpable bilaterally. Temperature gradient within normal limits.  Neuro Epicritic and protective threshold sensation diminished bilaterally.  Musculoskeletal Exam No symptomatic pedal deformities noted bilateral. Muscular strength within normal limits.  ASSESSMENT 1. Diabetes Mellitus w/ peripheral neuropathy 2. Onychomycosis of nail due to dermatophyte bilateral 3. Pain in foot bilateral 4. Porokeratotic callus of right second toe  PLAN OF CARE 1. Patient evaluated today. 2. Instructed to maintain good pedal hygiene and foot care. Stressed importance of controlling blood sugar.  3. Mechanical debridement of nails 1-5 bilaterally performed using a nail nipper. Filed with dremel without incident.  4. Excisional debridement of keratotic lesion using a chisel blade was performed without incident.  5. Treated area(s) with Salinocaine and dressed with light dressing. 6. Silicone toe caps dispensed. 7. Return to clinic when necessary.    Edrick Kins, DPM [facility]  Dr. Edrick Kins, [place]                                        [place], [place] [address]                Office [phone]  Fax [phone]

## 2017-01-09 ENCOUNTER — Other Ambulatory Visit: Payer: Self-pay | Admitting: Internal Medicine

## 2017-01-09 DIAGNOSIS — I10 Essential (primary) hypertension: Secondary | ICD-10-CM

## 2017-01-21 LAB — HM DIABETES EYE EXAM

## 2017-02-03 ENCOUNTER — Encounter: Payer: Self-pay | Admitting: *Deleted

## 2017-02-08 ENCOUNTER — Ambulatory Visit (INDEPENDENT_AMBULATORY_CARE_PROVIDER_SITE_OTHER): Payer: Medicare PPO | Admitting: Neurology

## 2017-02-08 ENCOUNTER — Encounter: Payer: Self-pay | Admitting: Neurology

## 2017-02-08 VITALS — BP 125/74 | HR 57 | Ht 70.0 in | Wt 252.0 lb

## 2017-02-08 DIAGNOSIS — R062 Wheezing: Secondary | ICD-10-CM | POA: Insufficient documentation

## 2017-02-08 DIAGNOSIS — R351 Nocturia: Secondary | ICD-10-CM | POA: Diagnosis not present

## 2017-02-08 DIAGNOSIS — R0683 Snoring: Secondary | ICD-10-CM | POA: Diagnosis not present

## 2017-02-08 DIAGNOSIS — G4733 Obstructive sleep apnea (adult) (pediatric): Secondary | ICD-10-CM

## 2017-02-08 DIAGNOSIS — R9721 Rising PSA following treatment for malignant neoplasm of prostate: Secondary | ICD-10-CM

## 2017-02-08 NOTE — Progress Notes (Signed)
SLEEP MEDICINE CLINIC   Provider:  Larey Seat, M D  Primary Care Physician:  [name], MD   Referring Provider: [name]    Chief Complaint  Patient presents with  . New Patient (Initial Visit)    sleep, sleep study in past, snores    HPI:  [name] is a 81 y.o. male , seen here as in a referral from Dr. Evette Doffing for a sleep study.   [name] is a 81 year old right-handed gentleman who is seen here in the presence of his wife and had a Silastic sleep study in 2005. But he initially could use CPAP rather well he developed prostate cancer and underwent surgery leaving him with frequent urination and nocturia. The constant up and down at night was a great have full for him, to put the mask back into place switch the machine on and off etc. and he finally gave up. Over the last 2 years he has developed more swelling in his left more shortness of breath, which his primary care physician attributes to underlying COPD, he is often wheezing. In addition he has hypertension, diabetes and is now excessively sleepy in daytime. He is to be reevaluated for obstructive sleep apnea and for the best possible treatment.  Chief complaint according to patient : need CPAP, again, but still have nocturia.   Sleep habits are as follows: 10 PM is usually his bedtime, and he watches TV some nights. He rises at 5.30AM for work. He reports having multiple bathroom breaks between 5 and 7 at night, depending on fluid intake and still wakes with a dry mouth. Of C7 hours he may spend in bed he may get only 5 hours of sleep on a good night. He sleeps on his left side, sleeping on 2 pillows.  The bedroom is cool, quiet and dark, with a fan running. He is tired in daytime, he wakes with a dry mouth, diaphoretic, but denies palpitations, dizziness and nausea.    Sleep medical history and family sleep history: There is no known family history of apnea. The patient begun snoring  loudly more than 2 decades ago and at the time his wife had noticed apnea, they no sleep in separate bedrooms.   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 Lives with wife, adult son lives temporarily with them.  No pets. his wife reports him to snore for 20 years, she witnessed apnea before the first sleep evaluation - 2005, now sleeps separately. Remote history of shift work. Quit smoking in 1998. No ETOH use, Caffeine : ' all the time"   2 coffee in AM, 2-3 soda in PM and iced tea with dinner. 6 a day. Naps daily.     Review of Systems: Out of a complete 14 system review, the patient complains of only the following symptoms, and all other reviewed systems are negative. Snoring, apnea, wheezing, sweating, SOB , ankle edema. Very frequent nocturia interrupts his sleep.  " Light flashing in my eye"  Silent stroke diagnosed by image - found in 2017  Epworth score  13 , Fatigue severity score 42  , depression score 5/15    Social History   Social History  . Marital status: Married    Spouse name: N/A  . Number of children: N/A  . Years of education: N/A   Occupational History  . Not on file.   Social  History Main Topics  . Smoking status: Former Smoker    Packs/day: 0.25    Years: 25.00    Types: Cigarettes    Quit date: 11/25/2004  . Smokeless tobacco: Never Used  . Alcohol use No  . Drug use: No  . Sexual activity: No   Other Topics Concern  . Not on file   Social History Narrative  . No narrative on file    Family History  Problem Relation Age of Onset  . Heart attack Mother 58  . Cancer Neg Hx   . Diabetes Neg Hx     Past Medical History:  Diagnosis Date  . Adenomatous colon polyp 12/14/2005   5 mm polyp in descending colon, found on colonoscopy by Dr. Wilford Corner  . Allergic rhinitis   . Asymptomatic cholelithiasis 11/30/2014  . Bladder calculi   . Bladder cancer ([place]) 2010    S/P TURBT   . Bladder neck contracture   . Enlarged heart    "slight" (09/09/2016)  . GERD (gastroesophageal reflux disease)   . Hand eczema   . Hepatitis, unspecified PT STATES HAD HEPATITIS APPROX 1980'S ; UNSURE WHAT TYPE   Hepatitis C RNA Quant on July 06, 2007 showed no detectable virus.  . High cholesterol   . History of kidney stones   . Hypertension   . Nocturia   . OSA on CPAP    Severe by diagnostic polysomnogram 06/22/2004.  Marland Kitchen Prostate cancer ([place]) 2009   S/P EXTERNAL RADIATION, SEED IMPLANTS  . Self-catheterizes urinary bladder    "q couple weeks prn" (09/09/2016)  . Sleep apnea    "don't use the mask anymore" (09/09/2016)    Past Surgical History:  Procedure Laterality Date  . CARDIOVASCULAR STRESS TEST  07-14-2011  DR NISHAN   NORMAL NUCLEAR STUDY/ EF 62%  . CIRCUMCISION N/A 04/08/2015   Procedure: CIRCUMCISION ADULT;  Surgeon: Franchot Gallo, MD;  Location: [facility];  Service: Urology;  Laterality: N/A;  . CYSTOSCOPY WITH LITHOLAPAXY N/A 10/24/2012   Procedure: CYSTOSCOPY WITH LITHOLAPAXY;  Surgeon: Franchot Gallo, MD;  [facility];  Service: Urology;  Laterality: N/A;  TRANSURETHRAL RESECTION OF BLADDER NECK CONTRACTURE WITH COLLINS KNIFE     . CYSTOSCOPY WITH URETHRAL DILATATION N/A 04/08/2015   Procedure: CYSTOSCOPY WITH BALLOON DILATATION OF BLADDER NECK CONTRACTURE;  Surgeon: Franchot Gallo, MD;  Location: [facility];  Service: Urology;  Laterality: N/A;  . KNEE ARTHROSCOPY Bilateral 1970's  . RADIOACTIVE PROSTATE SEED IMPLANTS  04-11-2008  . TRANSURETHRAL RESECTION OF BLADDER NECK  12-19-2008  . TRANSURETHRAL RESECTION OF BLADDER TUMOR  02-19-2009   AND TUR BLADDER NECK    Current Outpatient Prescriptions  Medication Sig Dispense Refill  . amLODipine (NORVASC) 10 MG tablet Take 1 tablet (10 mg total) by mouth daily. (Patient taking differently: Take 5 mg by mouth daily. ) 90 tablet 3  . aspirin EC 81 MG tablet Take 1 tablet  (81 mg total) by mouth daily. 150 tablet 2  . hydrochlorothiazide (HYDRODIURIL) 25 MG tablet Take 1 tablet (25 mg total) by mouth daily. 90 tablet 3  . Hypromellose (ARTIFICIAL TEARS OP) Place 1 drop into the left eye daily.    . ILEVRO 0.3 % ophthalmic suspension INSTILL 1 DROP INTO RIGHT EYE EVERY DAY  2  . losartan (COZAAR) 100 MG tablet Take 1 tablet (100 mg total) by mouth daily. Please note change in tablet dose. 90 tablet 3  . losartan (COZAAR) 100 MG tablet TAKE 1 TABLET (100  MG TOTAL) BY MOUTH DAILY. PLEASE NOTE CHANGE IN TABLET DOSE. 90 tablet 3  . metFORMIN (GLUCOPHAGE-XR) 500 MG 24 hr tablet TAKE 1 TABLET (500 MG TOTAL) BY MOUTH DAILY WITH BREAKFAST. 30 tablet 5  . omeprazole (PRILOSEC) 40 MG capsule TAKE 1 CAPSULE (40 MG TOTAL) BY MOUTH DAILY. 30 capsule 5  . pravastatin (PRAVACHOL) 20 MG tablet Take 1 tablet (20 mg total) by mouth every evening. (Patient taking differently: Take 20 mg by mouth every morning. ) 90 tablet 3   No current facility-administered medications for this visit.     Allergies as of 02/08/2017 - Review Complete 02/08/2017  Allergen Reaction Noted  . Aspirin Nausea Only 07/14/2011    Vitals: BP 125/74   Pulse (!) 57   Ht 5\' 10"  (1.778 m)   Wt 252 lb (114.3 kg)   BMI 36.16 kg/m  Last Weight:  Wt Readings from Last 1 Encounters:  02/08/17 252 lb (114.3 kg)   PQZ:RAQT mass index is 36.16 kg/m.     Last Height:   Ht Readings from Last 1 Encounters:  02/08/17 5\' 10"  (1.778 m)    Physical exam:  General: The patient is awake, alert and appears not in acute distress. The patient is well groomed. Head: Normocephalic, atraumatic. Neck is supple. Mallampati,  neck circumference: 18. 5 . Nasal airflow congested, No Retrognathia is seen.  Cardiovascular:  Regular rate and rhythm , without  murmurs or carotid bruit, and without distended neck veins. Respiratory: Lungs ; wheezing . Skin:  Without evidence of edema, or rash Trunk: BMI is 36 . The patient's  posture is erect. Pyknic  Neurologic exam : The patient is awake and alert, oriented to place and time.   Attention span & concentration ability appears normal.  Speech is fluent,  With mild dysarthria,not  dysphonia nor aphasia.  Mood and affect are appropriate.  Cranial nerves: Pupils are equal and briskly reactive to light. Funduscopic exam without  evidence of pallor or edema.cataract surgery bilaterally- Extraocular movements  in vertical and horizontal planes intact and without nystagmus. Visual fields by finger perimetry are intact.Hearing to finger rub intact. Facial sensation intact to fine touchFacial motor strength is symmetric and tongue and uvula move midline. Shoulder shrug was symmetrical.  Motor exam:   Normal tone, muscle bulk and symmetric strength in all extremities. Sensory:  Fine touch, pinprick and vibration were tested in all extremities. Proprioception tested in the upper extremities was normal. Coordination: Finger-to-nose maneuver  normal without evidence of ataxia, dysmetria or tremor. Gait and station: Patient walks without assistive device and is able unassisted to climb up to the exam table. Strength within normal limits.  Stance is stable and normal.   Deep tendon reflexes: in the  upper and lower extremities are symmetric and intact. Babinski maneuver response is downgoing. Assessment:  After physical and neurologic examination, review of laboratory studies,  Personal review of imaging studies, reports of other /same  Imaging studies, results of polysomnography and / or neurophysiology testing and pre-existing records as far as provided in visit., my assessment is   1) OSA, current  untreated   2) COPD , untreated (" inhaler not used- did not work")  3) obesity  4) hypersomnia , when not physically active or not stimulated. Naps 1-2 hours daily.   The patient was advised of the nature of the diagnosed disorder , the treatment options and the  risks for  general health and wellness arising from not treating the condition.  I spent more than 50 minutes of face to face time with the patient.  Greater than 50% of time was spent in counseling and coordination of care. We have discussed the diagnosis and differential and I answered the patient's questions.    Plan:  Treatment plan and additional workup :  SPLIT night with AHI of 20. Patient is very sceptic of FFM , may try smaller interface. If o2 levels stay low with CPAP, need to ad o2, please.  CO2 not needed.   No orders of the defined types were placed in this encounter.    No orders of the defined types were placed in this encounter.       Larey Seat, MD 01/15/3418, 6:22 AM  Certified in Neurology by ABPN Certified in [place] by [facility] [address], [place], [place] [address]

## 2017-02-08 NOTE — Patient Instructions (Signed)

## 2017-02-09 ENCOUNTER — Encounter: Payer: Self-pay | Admitting: *Deleted

## 2017-03-24 ENCOUNTER — Other Ambulatory Visit (HOSPITAL_BASED_OUTPATIENT_CLINIC_OR_DEPARTMENT_OTHER): Payer: Medicare PPO

## 2017-03-24 DIAGNOSIS — C61 Malignant neoplasm of prostate: Secondary | ICD-10-CM | POA: Diagnosis not present

## 2017-03-24 DIAGNOSIS — C7951 Secondary malignant neoplasm of bone: Principal | ICD-10-CM

## 2017-03-24 LAB — COMPREHENSIVE METABOLIC PANEL
ALT: 63 U/L — ABNORMAL HIGH (ref 0–55)
AST: 47 U/L — ABNORMAL HIGH (ref 5–34)
Albumin: 4 g/dL (ref 3.5–5.0)
Alkaline Phosphatase: 59 U/L (ref 40–150)
Anion Gap: 8 mEq/L (ref 3–11)
BUN: 14.2 mg/dL (ref 7.0–26.0)
CO2: 24 mEq/L (ref 22–29)
Calcium: 10.1 mg/dL (ref 8.4–10.4)
Chloride: 107 mEq/L (ref 98–109)
Creatinine: 1.2 mg/dL (ref 0.7–1.3)
EGFR: 69 mL/min/{1.73_m2} — ABNORMAL LOW (ref >90)
Glucose: 117 mg/dl (ref 70–140)
Potassium: 3.6 mEq/L (ref 3.5–5.1)
Sodium: 140 mEq/L (ref 136–145)
Total Bilirubin: 0.63 mg/dL (ref 0.20–1.20)
Total Protein: 7.9 g/dL (ref 6.4–8.3)

## 2017-03-24 LAB — CBC WITH DIFFERENTIAL/PLATELET
BASO%: 0.3 % (ref 0.0–2.0)
Basophils Absolute: 0 10*3/uL (ref 0.0–0.1)
EOS ABS: 0.2 10*3/uL (ref 0.0–0.5)
EOS%: 3.4 % (ref 0.0–7.0)
HCT: 38.7 % (ref 38.4–49.9)
HEMOGLOBIN: 13.2 g/dL (ref 13.0–17.1)
LYMPH%: 54.7 % — AB (ref 14.0–49.0)
MCH: 30.9 pg (ref 27.2–33.4)
MCHC: 34.1 g/dL (ref 32.0–36.0)
MCV: 90.6 fL (ref 79.3–98.0)
MONO#: 0.6 10*3/uL (ref 0.1–0.9)
MONO%: 9.6 % (ref 0.0–14.0)
NEUT%: 32 % — ABNORMAL LOW (ref 39.0–75.0)
NEUTROS ABS: 2 10*3/uL (ref 1.5–6.5)
PLATELETS: 163 10*3/uL (ref 140–400)
RBC: 4.27 10*6/uL (ref 4.20–5.82)
RDW: 13.3 % (ref 11.0–14.6)
WBC: 6.2 10*3/uL (ref 4.0–10.3)
lymph#: 3.4 10*3/uL — ABNORMAL HIGH (ref 0.9–3.3)

## 2017-03-25 LAB — PSA: Prostate Specific Ag, Serum: 7.7 ng/mL — ABNORMAL HIGH (ref 0.0–4.0)

## 2017-03-26 ENCOUNTER — Ambulatory Visit (HOSPITAL_BASED_OUTPATIENT_CLINIC_OR_DEPARTMENT_OTHER): Payer: Medicare PPO | Admitting: Oncology

## 2017-03-26 ENCOUNTER — Telehealth: Payer: Self-pay | Admitting: Oncology

## 2017-03-26 ENCOUNTER — Telehealth: Payer: Self-pay | Admitting: Pharmacy Technician

## 2017-03-26 VITALS — BP 145/71 | HR 56 | Temp 97.7°F | Resp 17 | Ht 70.0 in | Wt 255.1 lb

## 2017-03-26 DIAGNOSIS — C61 Malignant neoplasm of prostate: Secondary | ICD-10-CM | POA: Diagnosis not present

## 2017-03-26 DIAGNOSIS — E291 Testicular hypofunction: Secondary | ICD-10-CM

## 2017-03-26 DIAGNOSIS — C7951 Secondary malignant neoplasm of bone: Principal | ICD-10-CM

## 2017-03-26 MED ORDER — ENZALUTAMIDE 40 MG PO CAPS: 160.0000 mg | ORAL_CAPSULE | Freq: Every day | ORAL | 0 refills | Status: DC

## 2017-03-26 NOTE — Telephone Encounter (Signed)
Oral Oncology Patient Advocate Encounter  Received notification from [facility] that prior authorization for [name] is required.  PA submitted on CoverMyMeds Key [id number] Status is pending  This encounter will be updated until final determination.    Fabio Asa. Melynda Keller, [place] [facility] [place] [phone] 03/26/2017 4:07 PM

## 2017-03-26 NOTE — Progress Notes (Signed)
Hematology and Oncology Follow Up Visit  [name] [id number] Apr 16, 1936 81 y.o. 03/26/2017 12:28 PM [name], MDVincent, Mallie [name],*   Principle Diagnosis: 81 year old gentleman with prostate cancer diagnosed in 2009. His PSA was 3.2 and a Gleason score of 8. 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   Prior Therapy: He was treated with androgen deprivation as well as definitive radiation therapy. His PSA nadir was 0.5 in 2009.   His PSA continues to rise and in 2013 his PSA was 6.42. Biopsy taken at that time which showed no evidence of recurrent disease. Bone scan in June 2014 did not really show any evidence of metastatic disease.   His PSA in February 2015 was up to 36.35 and a fluoride PET scan showed measurable disease in the second rib as well as the 10th thoracic vertebral body. He was started with Mills Koller and has been on Lupron since that time. He had an excellent PSA response initially with a nadir down to 0.08 in April 2016.   His PSA was 0.8 in February 2017 and Casodex was added. In April 2017 his PSA was 0.28 and started to rise again in July 2017. His PSA was 0.44 and subsequently was 1.09 in October 2017. On 10/09/2016 his PSA was 3.39. His testosterone level was 30.4. Casodex was discontinued in April 2018.   Current therapy: Under consideration to start second line hormonal therapy.  Interim History: [name] today for a follow-up visit. Since the last visit, he reports no changes in his health. He remains active and continues to attend to activities of daily living. He reports knee pain which is chronic in nature related to his occupation and activity. His appetite is excellent and does not report any bone pain. He does not report any pathological fractures or falls. His quality of life is unchanged.  He does not report any headaches, blurry vision,  syncope or seizures. He does not report any fevers, chills or sweats. He does not report any cough, wheezing or hemoptysis. He is not reporting nausea, vomiting or abdominal pain. He does not report any chest pain, palpitation, orthopnea or leg edema. He does not report any hematuria, dysuria. He does not report any flank pain, ecchymosis or rash. He does not report lymphadenopathy. Remaining review of systems unremarkable.   Medications: I have reviewed the patient's current medications.  Current Outpatient Prescriptions  Medication Sig Dispense Refill  . amLODipine (NORVASC) 10 MG tablet Take 1 tablet (10 mg total) by mouth daily. (Patient taking differently: Take 5 mg by mouth daily. ) 90 tablet 3  . aspirin EC 81 MG tablet Take 1 tablet (81 mg total) by mouth daily. 150 tablet 2  . enzalutamide (XTANDI) 40 MG capsule Take 4 capsules (160 mg total) by mouth daily. 120 capsule 0  . hydrochlorothiazide (HYDRODIURIL) 25 MG tablet Take 1 tablet (25 mg total) by mouth daily. 90 tablet 3  . Hypromellose (ARTIFICIAL TEARS OP) Place 1 drop into the left eye daily.    . ILEVRO 0.3 % ophthalmic suspension INSTILL 1 DROP INTO RIGHT EYE EVERY DAY  2  . losartan (COZAAR) 100 MG tablet Take 1 tablet (100 mg total) by mouth daily. Please note change in tablet dose. 90 tablet 3  . losartan (COZAAR) 100 MG tablet TAKE 1 TABLET (100 MG TOTAL) BY MOUTH DAILY. PLEASE NOTE CHANGE IN TABLET DOSE. 90 tablet 3  . metFORMIN (  GLUCOPHAGE-XR) 500 MG 24 hr tablet TAKE 1 TABLET (500 MG TOTAL) BY MOUTH DAILY WITH BREAKFAST. 30 tablet 5  . omeprazole (PRILOSEC) 40 MG capsule TAKE 1 CAPSULE (40 MG TOTAL) BY MOUTH DAILY. 30 capsule 5  . pravastatin (PRAVACHOL) 20 MG tablet Take 1 tablet (20 mg total) by mouth every evening. (Patient taking differently: Take 20 mg by mouth every morning. ) 90 tablet 3   No current facility-administered medications for this visit.      Allergies:  Allergies  Allergen Reactions  . Aspirin  Nausea Only    Patient stated,"I get nauseated with higher doses of Aspirin."    Past Medical History, Surgical history, Social history, and Family History were reviewed and updated.  Physical Exam: Blood pressure (!) 145/71, pulse (!) 56, temperature 97.7 F (36.5 C), temperature source Oral, resp. rate 17, height 5\' 10"  (1.778 m), weight 255 lb 1.6 oz (115.7 kg), SpO2 99 %. ECOG: 1 General appearance: alert and cooperative appeared without distress. Head: Normocephalic, without obvious abnormality no oral ulcers or lesions. Neck: no adenopathy Lymph nodes: Cervical, supraclavicular, and axillary nodes normal. Heart:regular rate and rhythm, S1, S2 normal, no murmur, click, rub or gallop Lung:chest clear, no wheezing, rales, normal symmetric air entry Abdomin: soft, non-tender, without masses or organomegaly shifting dullness or ascites. EXT:no erythema, induration, or nodules   Lab Results: Lab Results  Component Value Date   WBC 6.2 03/24/2017   HGB 13.2 03/24/2017   HCT 38.7 03/24/2017   MCV 90.6 03/24/2017   PLT 163 03/24/2017     Chemistry      Component Value Date/Time   NA 140 03/24/2017 0812   K 3.6 03/24/2017 0812   CL 104 09/09/2016 1501   CO2 24 03/24/2017 0812   BUN 14.2 03/24/2017 0812   CREATININE 1.2 03/24/2017 0812      Component Value Date/Time   CALCIUM 10.1 03/24/2017 0812   ALKPHOS 59 03/24/2017 0812   AST 47 (H) 03/24/2017 0812   ALT 63 (H) 03/24/2017 0812   BILITOT 0.63 03/24/2017 0812     Results for [name], [name] I (MRN [id number]) as of 03/26/2017 12:00  Ref. Range 12/25/2016 08:12 03/24/2017 08:12  Prostate Specific Ag, Serum Latest Ref Range: 0.0 - 4.0 ng/mL 4.9 (H) 7.7 (H)      Impression and Plan:   81 year old gentleman with the following issues:  1. Prostate cancer diagnosed in March 2009. At that time his PSA was 3.2 and a Gleason score 4+4 = 8. He received definitive radiotherapy with androgen deprivation in addition to  radiation therapy and seed implants. He had a PSA nadir to 0.5 in September 2009.  His PSA started to rise again slowly over the last 6 years and in February 2015 his PSA was 36.35. Androgen deprivation was started initially with Mills Koller and has been on Lupron since that time. Casodex was added in February 2017 for a small rise in his PSA.   His PSA continues to rise despite androgen deprivation and Casodex. Casodex withdrawal did not improve his PSA which is currently at 7.7. His bone scan did not show any clear measurable disease although he has previously questionable rib metastasis.  Treatment options were reviewed extensively today. These would include second line hormone therapy with Nicki Reaper or Apalutamide. Continued observation would be also an option for him given his low volume disease and minimal.   After discussion and reviewing the risks and benefits he is agreeable to proceed with therapy. Complications associated  with [facility] specifically were reviewed. These would include fatigue, tiredness, edema, hypertension and rarely seizures. Written information was given to the patient and is agreeable to proceed and 160 mg daily dose.  2. Androgen deprivation therapy: This will be continued indefinitely.  3. Follow-up: Will be 5 weeks to assess his response and tolerance to this medication.    [facility], MD 8/17/201812:28 PM

## 2017-03-26 NOTE — Telephone Encounter (Signed)
Scheduled appt per 8/17 los - Gave patient AVS and calender per los  

## 2017-03-29 NOTE — Telephone Encounter (Signed)
Oral Oncology Patient Advocate Encounter  Prior Authorization for [name] has been approved.    PA# [id number] Effective dates: 03/27/2017 through 03/27/2018.   Oral Oncology Clinic will continue to follow.   Fabio Asa. Melynda Keller, [place] Patient [place] [phone] 03/29/2017 9:00 AM

## 2017-04-01 ENCOUNTER — Telehealth: Payer: Self-pay | Admitting: Pharmacist

## 2017-04-01 DIAGNOSIS — C7951 Secondary malignant neoplasm of bone: Principal | ICD-10-CM

## 2017-04-01 DIAGNOSIS — C61 Malignant neoplasm of prostate: Secondary | ICD-10-CM

## 2017-04-01 MED ORDER — ENZALUTAMIDE 40 MG PO CAPS: 160.0000 mg | ORAL_CAPSULE | Freq: Every day | ORAL | 0 refills | Status: DC

## 2017-04-01 NOTE — Telephone Encounter (Signed)
Oral Oncology Pharmacist Encounter  Received new prescription for Xtandi for the treatment of advanced, castration-resistant prostate cancer in conjunction with Firmagon, planned duration until disease progression or unacceptable toxicity.  Labs from 03/24/17 assessed, OK for treatment.  Current medication list in Epic reviewed, DDIs with Xtandi identified:  Xtandi and Cozaar: category D interactions, Xtandi induces CYP3A4 and CYP2C9 which will lead to increased clearance of Cozaar and likely decreased exposure/efficacy. No change to current regimen indicated at this time, patient will be counseled about monitoring BP and it will be monitored at all office visits.  Xtandi and Norvasc: category D interaction due to CYP3A4 induction and increased clearance of Norvasc. BP plan as above.  Xtandi and Prilosec: category D interaction due to Xtandi's induction of CYP2C19 and increased clearance of Prilosec. Patient will be counseled about possible decreased Prilosec efficacy. No change to current therapy indicated at this time.  Prescription has been e-scribed to the [facility] for benefits analysis and approval.  I called and LVM for patient with offer of prescription status update and initial counseling.  Oral Oncology Clinic will continue to follow for insurance authorization, copayment issues, initial counseling and start date.  [name], PharmD, BCPS, BCOP 04/01/2017 11:28 AM Oral Oncology Clinic [phone]

## 2017-04-05 ENCOUNTER — Other Ambulatory Visit: Payer: Self-pay | Admitting: Pharmacist

## 2017-04-05 NOTE — Telephone Encounter (Addendum)
Oral Chemotherapy Pharmacist Encounter   I spoke with patient and wife, Otila Kluver, for overview of new oral chemotherapy medication: Xtandi for the treatment of advanced, castration-resistant prostate cancer in conjunction with Lupron, planned duration until disease progression or unacceptable toxicity.  Pt is doing well. The prescriptions have been sent to the [facility] for benefit analysis and approval.  Copayment [id number], this is prohibitively expensive for patient. Oral Oncology Patient Advocate will follow-up with patient for acquisition options.  Counseled patient on administration, dosing, side effects, safe handling, and monitoring. Patient will take Xtandi 40mg  capsules, 4 capsules (160mg ) by mouth once daily without regard to food. Patient states he plans to take his Xtandi each morning.  Side effects include but not limited to: fatigue, arthralgias, seizures, hypertension, GI upset, and hot flashes.    Discussed previously discovered drug interactions. Patient aware BP may increase with Xtandi usage and as a result of decreased efficacy of current antihypertensives due to [facility] DDI. Patient states he is no longer taking omeprazole, this has been removed from medication list in Epic.  Reviewed with patient importance of keeping a medication schedule and plan for any missed doses.  Mr. Wafer voiced understanding and appreciation.   All questions answered.  Will follow up with patient regarding copayment issues and start date.   Patient knows to call the office with questions or concerns. Oral Oncology Clinic will continue to follow.  Thank you,  [name], PharmD, BCPS, BCOP 04/05/2017  1:16 PM Oral Oncology Clinic [phone]

## 2017-04-06 ENCOUNTER — Telehealth: Payer: Self-pay | Admitting: Pharmacy Technician

## 2017-04-06 MED FILL — XTANDI 40 MG CAPSULE: 40 | 30 days supply | Qty: 120 | Fill #0

## 2017-04-06 NOTE — Telephone Encounter (Signed)
Oral Oncology Patient Advocate Encounter  Was successful in securing patient a $ 3500 grant from Patient [name] (PAF) to provide copayment coverage for his Xtandi.  This will keep the out of pocket expense at $0.    I have spoken with the patient's wife.  We have arranged for his [name] to ship to the patient's house today 04/06/2017 with expected delivery on 04/07/2017.     The billing information is as follows and has been shared with [place].   Member ID: [id number] Group ID: [id number] RxBin: [id number] Dates of Eligibility: 04/06/2017 through 04/07/19  [name]. Melynda Keller, [place] Patient [place] [phone] 04/06/2017 2:54 PM

## 2017-04-08 NOTE — Telephone Encounter (Signed)
Oral Chemotherapy Pharmacist Encounter   I spoke with patient and wife, [name], for overview of new oral chemotherapy medication: Xtandi for Ronnie treatment of advanced, castration-resistant prostate cancerin conjunction with Lupron, planned duration until disease progression or unacceptable toxicity.  Pt is doing well.  They received Xtandi in Ronnie mail from Ronnie pharmacy yesterday Xtandi start date: 04/08/17  Counseled patient on administration, dosing, side effects, safe handling, and monitoring. Patient will take Xtandi 40mg  capsules, 4 capsules (160mg ) by mouth once daily without regard to food. Patient states he took his Xtandi this morning and will continue this doing scheme.  Side effects include but not limited to: fatigue, arthralgias, seizures, hypertension, GI upset, and hot flashes.    Reviewed with patient importance of keeping a medication schedule and plan for any missed doses.  [name] voiced understanding and appreciation.   All questions answered.  Patient knows to call Ronnie office with questions or concerns. Oral Oncology Clinic will continue to follow.  Thank you,  [name], PharmD, BCPS, BCOP 04/08/2017  2:57 PM Oral Oncology Clinic [phone]

## 2017-04-15 ENCOUNTER — Encounter: Payer: Self-pay | Admitting: *Deleted

## 2017-04-19 ENCOUNTER — Other Ambulatory Visit: Payer: Self-pay | Admitting: Pharmacist

## 2017-04-27 ENCOUNTER — Other Ambulatory Visit: Payer: Self-pay | Admitting: Oncology

## 2017-04-27 DIAGNOSIS — C61 Malignant neoplasm of prostate: Secondary | ICD-10-CM

## 2017-04-27 DIAGNOSIS — C7951 Secondary malignant neoplasm of bone: Principal | ICD-10-CM

## 2017-04-29 MED FILL — XTANDI 40 MG CAPSULE: 40 | 30 days supply | Qty: 120 | Fill #0

## 2017-05-03 ENCOUNTER — Other Ambulatory Visit (HOSPITAL_BASED_OUTPATIENT_CLINIC_OR_DEPARTMENT_OTHER): Payer: Medicare PPO

## 2017-05-03 DIAGNOSIS — C61 Malignant neoplasm of prostate: Secondary | ICD-10-CM | POA: Diagnosis not present

## 2017-05-03 DIAGNOSIS — C7951 Secondary malignant neoplasm of bone: Principal | ICD-10-CM

## 2017-05-03 LAB — COMPREHENSIVE METABOLIC PANEL
ALBUMIN: 3.8 g/dL (ref 3.5–5.0)
ALK PHOS: 58 U/L (ref 40–150)
ALT: 56 U/L — ABNORMAL HIGH (ref 0–55)
ANION GAP: 10 meq/L (ref 3–11)
AST: 38 U/L — ABNORMAL HIGH (ref 5–34)
BILIRUBIN TOTAL: 0.69 mg/dL (ref 0.20–1.20)
BUN: 12.3 mg/dL (ref 7.0–26.0)
CALCIUM: 10 mg/dL (ref 8.4–10.4)
CO2: 22 mEq/L (ref 22–29)
Chloride: 108 mEq/L (ref 98–109)
Creatinine: 1.1 mg/dL (ref 0.7–1.3)
EGFR: 78 mL/min/{1.73_m2} — AB (ref >90)
Glucose: 156 mg/dl — ABNORMAL HIGH (ref 70–140)
Potassium: 3.6 mEq/L (ref 3.5–5.1)
Sodium: 140 mEq/L (ref 136–145)
TOTAL PROTEIN: 8 g/dL (ref 6.4–8.3)

## 2017-05-03 LAB — CBC WITH DIFFERENTIAL/PLATELET
BASO%: 0.6 % (ref 0.0–2.0)
Basophils Absolute: 0 10*3/uL (ref 0.0–0.1)
EOS ABS: 0.2 10*3/uL (ref 0.0–0.5)
EOS%: 3.2 % (ref 0.0–7.0)
HCT: 40.3 % (ref 38.4–49.9)
HEMOGLOBIN: 13.8 g/dL (ref 13.0–17.1)
LYMPH%: 57.7 % — ABNORMAL HIGH (ref 14.0–49.0)
MCH: 31.3 pg (ref 27.2–33.4)
MCHC: 34.2 g/dL (ref 32.0–36.0)
MCV: 91.5 fL (ref 79.3–98.0)
MONO#: 0.5 10*3/uL (ref 0.1–0.9)
MONO%: 8.6 % (ref 0.0–14.0)
NEUT%: 29.9 % — ABNORMAL LOW (ref 39.0–75.0)
NEUTROS ABS: 1.6 10*3/uL (ref 1.5–6.5)
Platelets: 141 10*3/uL (ref 140–400)
RBC: 4.41 10*6/uL (ref 4.20–5.82)
RDW: 13.5 % (ref 11.0–14.6)
WBC: 5.3 10*3/uL (ref 4.0–10.3)
lymph#: 3 10*3/uL (ref 0.9–3.3)

## 2017-05-04 ENCOUNTER — Ambulatory Visit (HOSPITAL_BASED_OUTPATIENT_CLINIC_OR_DEPARTMENT_OTHER): Payer: Medicare PPO | Admitting: Oncology

## 2017-05-04 ENCOUNTER — Telehealth: Payer: Self-pay | Admitting: Oncology

## 2017-05-04 VITALS — BP 159/67 | HR 65 | Temp 98.2°F | Resp 18 | Ht 70.0 in | Wt 252.4 lb

## 2017-05-04 DIAGNOSIS — M25569 Pain in unspecified knee: Secondary | ICD-10-CM | POA: Diagnosis not present

## 2017-05-04 DIAGNOSIS — G8929 Other chronic pain: Secondary | ICD-10-CM | POA: Diagnosis not present

## 2017-05-04 DIAGNOSIS — C7951 Secondary malignant neoplasm of bone: Secondary | ICD-10-CM

## 2017-05-04 DIAGNOSIS — E291 Testicular hypofunction: Secondary | ICD-10-CM

## 2017-05-04 DIAGNOSIS — C61 Malignant neoplasm of prostate: Secondary | ICD-10-CM | POA: Diagnosis not present

## 2017-05-04 LAB — PSA: Prostate Specific Ag, Serum: 2.2 ng/mL (ref 0.0–4.0)

## 2017-05-04 NOTE — Telephone Encounter (Signed)
Scheduled apt per 9/25 los - Gave patient AVS and calender per los.

## 2017-05-04 NOTE — Progress Notes (Signed)
Hematology and Oncology Follow Up Visit  [name] [id number] 14-Feb-1936 81 y.o. 05/04/2017 12:20 PM [name], MDVincent, [name],*   Principle Diagnosis: 81 year old gentleman with prostate cancer diagnosed in 2009. His PSA was 3.2 and a Gleason score of 8. 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   Prior Therapy: He was treated with androgen deprivation as well as definitive radiation therapy. His PSA nadir was 0.5 in 2009.   His PSA continues to rise and in 2013 his PSA was 6.42. Biopsy taken at that time which showed no evidence of recurrent disease. Bone scan in June 2014 did not really show any evidence of metastatic disease.   His PSA in February 2015 was up to 36.35 and a fluoride PET scan showed measurable disease in the second rib as well as the 10th thoracic vertebral body. He was started with Mills Koller and has been on Lupron since that time. He had an excellent PSA response initially with a nadir down to 0.08 in April 2016.   His PSA was 0.8 in February 2017 and Casodex was added. In April 2017 his PSA was 0.28 and started to rise again in July 2017. His PSA was 0.44 and subsequently was 1.09 in October 2017. On 10/09/2016 his PSA was 3.39. His testosterone level was 30.4. Casodex was discontinued in April 2018.   Current therapy: Xtandi 160 mg daily started on 04/08/2017.  Interim History: [name] presents today for a follow-up visit. Since the last visit, he started taking Xtandi without complications. He denied any nausea, fatigue or edema. He remains active and continues to attend to activities of daily living. He reports knee pain which is chronic in nature and unchanged from previous examination. His appetite is excellent and does not report any bone pain. He does not report any pathological fractures or falls. His quality of life remain excellent. He denied any  neurological deficits or seizures.  He does not report any headaches, blurry vision, syncope or seizures. He does not report any fevers, chills or sweats. He does not report any cough, wheezing or hemoptysis. He is not reporting nausea, vomiting or abdominal pain. He does not report any chest pain, palpitation, orthopnea or leg edema. He does not report any hematuria, dysuria. He does not report any flank pain, ecchymosis or rash. He does not report lymphadenopathy. Remaining review of systems unremarkable.   Medications: I have reviewed the patient's current medications.  Current Outpatient Prescriptions  Medication Sig Dispense Refill  . amLODipine (NORVASC) 10 MG tablet Take 1 tablet (10 mg total) by mouth daily. (Patient taking differently: Take 5 mg by mouth daily. ) 90 tablet 3  . aspirin EC 81 MG tablet Take 1 tablet (81 mg total) by mouth daily. 150 tablet 2  . hydrochlorothiazide (HYDRODIURIL) 25 MG tablet Take 1 tablet (25 mg total) by mouth daily. 90 tablet 3  . Hypromellose (ARTIFICIAL TEARS OP) Place 1 drop into the left eye daily.    . ILEVRO 0.3 % ophthalmic suspension INSTILL 1 DROP INTO RIGHT EYE EVERY DAY  2  . losartan (COZAAR) 100 MG tablet Take 1 tablet (100 mg total) by mouth daily. Please note change in tablet dose. 90 tablet 3  . losartan (COZAAR) 100 MG tablet TAKE 1 TABLET (100 MG TOTAL) BY MOUTH DAILY. PLEASE NOTE CHANGE IN TABLET DOSE. 90 tablet 3  . metFORMIN (GLUCOPHAGE-XR) 500 MG 24 hr tablet TAKE 1  TABLET (500 MG TOTAL) BY MOUTH DAILY WITH BREAKFAST. 30 tablet 5  . pravastatin (PRAVACHOL) 20 MG tablet Take 1 tablet (20 mg total) by mouth every evening. (Patient taking differently: Take 20 mg by mouth every morning. ) 90 tablet 3  . XTANDI 40 MG capsule TAKE 4 CAPSULES (160 MG TOTAL) BY MOUTH DAILY. 120 capsule 0   No current facility-administered medications for this visit.      Allergies:  Allergies  Allergen Reactions  . Aspirin Nausea Only    Patient  stated,"I get nauseated with higher doses of Aspirin."    Past Medical History, Surgical history, Social history, and Family History were reviewed and updated.  Physical Exam: Blood pressure (!) 159/67, pulse 65, temperature 98.2 F (36.8 C), temperature source Oral, resp. rate 18, height 5\' 10"  (1.778 m), weight 252 lb 6.4 oz (114.5 kg), SpO2 98 %. ECOG: 1 General appearance: Well-appearing gentleman without distress. Head: Normocephalic, without obvious abnormality no oral thrush or ulcers. Neck: no adenopathy Lymph nodes: Cervical, supraclavicular, and axillary nodes normal. Heart:regular rate and rhythm, S1, S2 normal, no murmur, click, rub or gallop Lung:chest clear, no wheezing, rales, normal symmetric air entry Abdomin: soft, non-tender, without masses or organomegaly shifting dullness or ascites. EXT:no erythema, induration, or nodules   Lab Results: Lab Results  Component Value Date   WBC 5.3 05/03/2017   HGB 13.8 05/03/2017   HCT 40.3 05/03/2017   MCV 91.5 05/03/2017   PLT 141 05/03/2017     Chemistry      Component Value Date/Time   NA 140 05/03/2017 0757   K 3.6 05/03/2017 0757   CL 104 09/09/2016 1501   CO2 22 05/03/2017 0757   BUN 12.3 05/03/2017 0757   CREATININE 1.1 05/03/2017 0757      Component Value Date/Time   CALCIUM 10.0 05/03/2017 0757   ALKPHOS 58 05/03/2017 0757   AST 38 (H) 05/03/2017 0757   ALT 56 (H) 05/03/2017 0757   BILITOT 0.69 05/03/2017 0757       Results for Ronnie, [name] (MRN [id number]) as of 05/04/2017 11:53  Ref. Range 03/24/2017 08:12 05/03/2017 07:56 05/03/2017 07:57  Prostate Specific Ag, Serum Latest Ref Range: 0.0 - 4.0 ng/mL 7.7 (H)  2.2     Impression and Plan:   80 year old gentleman with the following issues:  1. Prostate cancer diagnosed in March 2009. At that time his PSA was 3.2 and a Gleason score 4+4 = 8. He received definitive radiotherapy with androgen deprivation in addition to radiation therapy and seed  implants. He had a PSA nadir to 0.5 in September 2009.  His PSA started to rise again slowly over the last 6 years and in February 2015 his PSA was 36.35. Androgen deprivation was started initially with Mills Koller and has been on Lupron since that time. Casodex was added in February 2017 for a small rise in his PSA.   His PSA continues to rise despite androgen deprivation and Casodex. Casodex withdrawal did not improve his PSA which is currently at 7.7. His bone scan did not show any clear measurable disease although he has previously questionable rib metastasis.  He is currently on extended 160 mg daily without complications. His PSA showed a reasonable response after one month of therapy declining to 2.2. Risks and benefits of continuing this medication were reviewed today and he is agreeable to continue.  2. Androgen deprivation therapy: This will be continued indefinitely.  3. Follow-up: Will be 6 weeks to assess his response and  tolerance to this medication.    Zola Button, MD 9/25/201812:20 PM

## 2017-05-11 ENCOUNTER — Ambulatory Visit (INDEPENDENT_AMBULATORY_CARE_PROVIDER_SITE_OTHER): Payer: Medicare PPO | Admitting: Neurology

## 2017-05-11 DIAGNOSIS — R9721 Rising PSA following treatment for malignant neoplasm of prostate: Secondary | ICD-10-CM

## 2017-05-11 DIAGNOSIS — R0683 Snoring: Secondary | ICD-10-CM

## 2017-05-11 DIAGNOSIS — R351 Nocturia: Secondary | ICD-10-CM

## 2017-05-11 DIAGNOSIS — G4733 Obstructive sleep apnea (adult) (pediatric): Secondary | ICD-10-CM | POA: Diagnosis not present

## 2017-05-11 DIAGNOSIS — R062 Wheezing: Secondary | ICD-10-CM

## 2017-05-12 NOTE — Telephone Encounter (Addendum)
Oral Oncology Patient Advocate Encounter  Was successful in securing patient an additional $ 7500 grant from [facility] [place] [facility]) to provide continued copayment coverage for his Xtandi. This will keep the out of pocket expense at $0.     The billing information is as follows and has been shared with [place].   Member ID: [id number] Group ID: [id number] RxBin: [id number] Dates of Eligibility: 02/10/2017 through 05/10/2018  Fabio Asa. Melynda Keller, [place] Patient [place] [phone] 05/12/2017 9:21 AM

## 2017-05-14 NOTE — Addendum Note (Signed)
Addended by: Larey Seat on: 05/14/2017 12:26 PM   Modules accepted: Orders

## 2017-05-14 NOTE — Procedures (Signed)
PATIENT'S NAME:  [name], Pries DOB:      1936/02/09      MR#:    [id number]     DATE OF RECORDING: 05/11/2017 REFERRING M.D.:  Lorenda Ishihara, M.D. Study Performed:  Split-Night Titration Study HISTORY:   [name] is a 81 year old who had a sleep study in 2005. While he initially could use CPAP rather well he developed prostate cancer and underwent surgery leaving him with frequent urination and nocturia. The constant up and down at night was difficult for him, to put the mask back into place, switch the machine on and off, etc. and he finally gave up. Over the last 2 years he has developed more swelling in his legs.There is  shortness of breath, which his primary care physician attributes to underlying COPD, as he is often wheezing. In addition he has hypertension, diabetes and is now excessively sleepy in daytime. He is to be reevaluated for obstructive sleep apnea and for the best possible treatment.  The patient endorsed the Epworth Sleepiness Scale at 13/24 points.   The patient's weight 252 pounds with a height of 70 (inches), resulting in a BMI of 36. Kg/m2. The patient's neck circumference measured 18.5 inches.  CURRENT MEDICATIONS: Amlodipine, Aspirin, Hydrochlorothiazide, Hypromellose, Ilevro, Metformin, Omeprazole and Pravastatin.    PROCEDURE:  This is a multichannel digital polysomnogram utilizing the Somnostar 11.2 system.  Electrodes and sensors were applied and monitored per AASM Specifications.   EEG, EOG, Chin and Limb EMG, were sampled at 200 Hz.  ECG, Snore and Nasal Pressure, Thermal Airflow, Respiratory Effort, CPAP Flow and Pressure, Oximetry was sampled at 50 Hz. Digital video and audio were recorded.      BASELINE STUDY WITHOUT CPAP RESULTS:  Lights Out was at 00:04 and Lights On at 08:01.  Total recording time (TRT) was 267, with a total sleep time (TST) of 147.5 minutes.   The patient's sleep latency was 89 minutes.  REM latency was 154.5 minutes.  The sleep  efficiency was 55.2 %.    SLEEP ARCHITECTURE: WASO (Wake after sleep onset) was 32.5 minutes, Stage N1 was 9.5 minutes, Stage N2 was 120.5 minutes, Stage N3 was 0 minutes and Stage R (REM sleep) was 17.5 minutes.  The percentages were Stage N1 6.4%, Stage N2 81.7%, Stage N3 0% and Stage R (REM sleep) 11.9%.   RESPIRATORY ANALYSIS:  There were a total of 72 respiratory events:  21 obstructive apneas, 51 hypopneas with 0 respiratory event related arousals (RERAs).  Snoring was noted.     The total APNEA/HYPOPNEA INDEX (AHI) was 29.3 /hour and the total RESPIRATORY DISTURBANCE INDEX was 29.3 /hour.  14 events occurred in REM sleep and 86 events in NREM. The REM AHI was 48, /hour versus a non-REM AHI of 26.8 /hour. The patient spent 122 minutes sleep time in the supine position 188 minutes in non-supine. The supine AHI was 0.0 /hour versus a non-supine AHI of 29.2 /hour.  OXYGEN SATURATION & C02:  The wake baseline 02 saturation was 97%, with the lowest being 83%. Time spent below 89% saturation equaled 5 minutes.  PERIODIC LIMB MOVEMENTS:    The patient had a total of 158 Periodic Limb Movements.  The Periodic Limb Movement (PLM) index was 64.3 /hour and the PLM Arousal index was 10.6 /hour. The arousals were noted as: 4 were spontaneous, 26 were associated with PLMs, and 73 were associated with respiratory events.  Audio and video analysis did not show any abnormal or unusual movements, behaviors,  phonations or vocalizations. The patient took one bathroom break. EKG was in keeping with normal sinus rhythm (NSR)  TITRATION STUDY WITH CPAP RESULTS:   CPAP was initiated at 5 cmH20 with heated humidity per AASM split night standards and pressure was advanced to 10/10 cmH20 because of hypopneas, apneas and desaturations.  At a PAP pressure of 10 cmH20, there was 100% sleep efficiency, nadir was 94% SpO2 , and there was a reduction of the AHI to 0.0 /hour.   Total recording time (TRT) was 210.5  minutes, with a total sleep time (TST) of 162.5 minutes. The patient's sleep latency was 45.5 minutes. REM latency was 69.5 minutes.  The sleep efficiency was 77.2 %.    SLEEP ARCHITECTURE: Wake after sleep was 11.5 minutes, Stage N1 10 minutes, Stage N2 122.5 minutes, Stage N3 0 minutes and Stage R (REM sleep) 30 minutes. The percentages were: Stage N1 6.2%, Stage N2 75.4%, Stage N3 0% and Stage R (REM sleep) 18.5%. The arousals were noted as: 10 were spontaneous, 6 were associated with PLMs, and 47 were associated with respiratory events.  RESPIRATORY ANALYSIS:  There were a total of 47 respiratory events: 0 apneas and 47 hypopneas with 0 respiratory event related arousals (RERAs).     The total APNEA/HYPOPNEA INDEX (AHI) was 17.4 /hour and the total RESPIRATORY DISTURBANCE INDEX was 17.4 /hour.  0 events occurred in REM sleep and 47 events in NREM. The REM AHI was 0 /hour versus a non-REM AHI of 21.3 /hour. The patient spent 75% of total sleep time in the supine position. The supine AHI was 23.1 /hour, versus a non-supine AHI of 0.0/hour.  OXYGEN SATURATION & C02:  The wake baseline 02 saturation was 96%, with the lowest being 89%. Time spent below 89% saturation equaled 0 minutes. PERIODIC LIMB MOVEMENTS:   The patient had a total of 113 Periodic Limb Movements. The Periodic Limb Movement (PLM) index was 41.7 /hour and the PLM Arousal index was 2.2 /hour.  Post-study, the patient indicated that sleep was better than usual.    POLYSOMNOGRAPHY IMPRESSION :   1. Severe Obstructive Sleep Apnea (OSA) accompanied by Snoring, not by significant hypoxemia. 2. Periodic Limb Movement Disorder (PLMD) was noted before CPAP application, but exacerbated under CPAP pressure.  3. Patient changed protective urine pad once at night.    RECOMMENDATIONS:  1. OSA responded excellent to CPAP of 9 cm water pressure and above. Advise to start CPAP with auto-titration capacity at 10 cm water with EPR of 1 cmH2O  and follow clinical symptomatology.   2. A ResMed P 10 with large nasal pillow was used with heated humidity during this study.  Advise to add heated humidity.  Adjust interface and heated humidity as needed.     3. Compliance to PAP therapy should be emphasized as 4 hours or more of nightly use.  Compliance, AHI and air leak information to be downloaded for objective assessment at 30 days, 180 days and annually thereafter.   4. There were periodic limb movements of sleep (PLMS) with associated sleep disruption.  Consider treating the PLMS primarily.  Consider treating the PLMS if they fail to resolve with PAP therapy or if there is a compelling history of restless legs syndrome or periodic limb movement disorder.  Pharmacotherapy may be warranted.  Obtain a serum ferritin level if the clinical history is consistent with RLS.  Consider iron therapy and evaluation for iron deficiency anemia if the serum ferritin level < 50 ng/ml.  Certain  medications or substances may aggravate RLS and common offenders may include the following:  nicotine, caffeine, SSRIs, TCAs, phenothiazine, dopamine antagonists, diphenhydramine, and alcohol.   5. A follow up appointment will be scheduled in the Sleep Clinic at [facility].      I certify that I have reviewed the entire raw data recording prior to the issuance of this report in accordance with the Standards of Accreditation of the American Academy of Sleep Medicine (AASM)    Larey Seat, M.D.   05-14-2017  Diplomat, American Board of Psychiatry and Neurology  Diplomat, [place] of [facility] Medical Director, [place] [facility] [employer]

## 2017-05-17 ENCOUNTER — Telehealth: Payer: Self-pay | Admitting: Neurology

## 2017-05-17 NOTE — Telephone Encounter (Signed)
I called pt. I advised pt that Dr. Brett Fairy reviewed their sleep study results and found that pt has severe OSA. Dr. Brett Fairy recommends that pt starts CPAP. I reviewed PAP compliance expectations with the pt. Pt is agreeable to starting a CPAP. I advised pt that an order will be sent to a DME, [facility], and [facility] will call the pt within about one week after they file with the pt's insurance. [facility] will show the pt how to use the machine, fit for masks, and troubleshoot the CPAP if needed. A follow up appt was made for insurance purposes with Lowella Dandy on Aug 20, 2016 at 11:00 am. Pt verbalized understanding to arrive 15 minutes early and bring their CPAP. A letter with all of this information in it will be mailed to the pt as a reminder. I verified with the pt that the address we have on file is correct. Pt verbalized understanding of results. Pt had no questions at this time but was encouraged to call back if questions arise.

## 2017-05-17 NOTE — Telephone Encounter (Signed)
-----   Message from Larey Seat, MD sent at 05/14/2017 12:26 PM EDT ----- OSA responded excellent to CPAP of 9 cm water pressure and  above. Advise to start CPAP with auto-titration capacity at 10 cm  water with EPR of 1 cmH2O and follow clinical symptomatology.  2. A ResMed P 10 with large nasal pillow was used with heated  humidity during this study. Advise to add heated humidity.  Adjust interface and heated humidity as needed.   3. Compliance to PAP therapy should be emphasized as 4 hours or  more of nightly use. Compliance, AHI and air leak information to  be downloaded for objective assessment at 30 days, 180 days and  annually thereafter.  4. There were periodic limb movements of sleep (PLMS) with  associated sleep disruption. Consider treating the PLMS  primarily.

## 2017-05-21 ENCOUNTER — Other Ambulatory Visit: Payer: Self-pay | Admitting: Oncology

## 2017-05-21 DIAGNOSIS — C61 Malignant neoplasm of prostate: Secondary | ICD-10-CM

## 2017-05-21 DIAGNOSIS — C7951 Secondary malignant neoplasm of bone: Principal | ICD-10-CM

## 2017-05-28 MED FILL — XTANDI 40 MG CAPSULE: 40 | 30 days supply | Qty: 120 | Fill #0

## 2017-06-14 ENCOUNTER — Other Ambulatory Visit (HOSPITAL_BASED_OUTPATIENT_CLINIC_OR_DEPARTMENT_OTHER): Payer: Medicare PPO

## 2017-06-14 DIAGNOSIS — C61 Malignant neoplasm of prostate: Secondary | ICD-10-CM | POA: Diagnosis not present

## 2017-06-14 DIAGNOSIS — C7951 Secondary malignant neoplasm of bone: Secondary | ICD-10-CM

## 2017-06-14 LAB — CBC WITH DIFFERENTIAL/PLATELET
BASO%: 0.3 % (ref 0.0–2.0)
Basophils Absolute: 0 10*3/uL (ref 0.0–0.1)
EOS%: 3.2 % (ref 0.0–7.0)
Eosinophils Absolute: 0.2 10*3/uL (ref 0.0–0.5)
HCT: 41.1 % (ref 38.4–49.9)
HGB: 13.7 g/dL (ref 13.0–17.1)
LYMPH%: 52.8 % — AB (ref 14.0–49.0)
MCH: 30.8 pg (ref 27.2–33.4)
MCHC: 33.5 g/dL (ref 32.0–36.0)
MCV: 92 fL (ref 79.3–98.0)
MONO#: 0.7 10*3/uL (ref 0.1–0.9)
MONO%: 12 % (ref 0.0–14.0)
NEUT%: 31.7 % — ABNORMAL LOW (ref 39.0–75.0)
NEUTROS ABS: 1.9 10*3/uL (ref 1.5–6.5)
Platelets: 163 10*3/uL (ref 140–400)
RBC: 4.46 10*6/uL (ref 4.20–5.82)
RDW: 14.1 % (ref 11.0–14.6)
WBC: 5.9 10*3/uL (ref 4.0–10.3)
lymph#: 3.1 10*3/uL (ref 0.9–3.3)

## 2017-06-14 LAB — COMPREHENSIVE METABOLIC PANEL
ALBUMIN: 4 g/dL (ref 3.5–5.0)
ALK PHOS: 56 U/L (ref 40–150)
ALT: 50 U/L (ref 0–55)
ANION GAP: 10 meq/L (ref 3–11)
AST: 38 U/L — ABNORMAL HIGH (ref 5–34)
BUN: 13.2 mg/dL (ref 7.0–26.0)
CALCIUM: 9.7 mg/dL (ref 8.4–10.4)
CO2: 23 mEq/L (ref 22–29)
CREATININE: 1 mg/dL (ref 0.7–1.3)
Chloride: 108 mEq/L (ref 98–109)
EGFR: >60 mL/min/{1.73_m2} (ref >60)
Glucose: 118 mg/dl (ref 70–140)
Potassium: 3.7 mEq/L (ref 3.5–5.1)
Sodium: 141 mEq/L (ref 136–145)
TOTAL PROTEIN: 8 g/dL (ref 6.4–8.3)
Total Bilirubin: 0.42 mg/dL (ref 0.20–1.20)

## 2017-06-15 ENCOUNTER — Other Ambulatory Visit: Payer: Self-pay | Admitting: *Deleted

## 2017-06-15 LAB — PSA: Prostate Specific Ag, Serum: 2 ng/mL (ref 0.0–4.0)

## 2017-06-15 MED ORDER — HYDROCHLOROTHIAZIDE 25 MG PO TABS: 25.0000 mg | ORAL_TABLET | Freq: Every day | ORAL | 3 refills | Status: DC

## 2017-06-16 ENCOUNTER — Telehealth: Payer: Self-pay

## 2017-06-16 ENCOUNTER — Ambulatory Visit (HOSPITAL_BASED_OUTPATIENT_CLINIC_OR_DEPARTMENT_OTHER): Payer: Medicare PPO | Admitting: Oncology

## 2017-06-16 VITALS — BP 169/79 | HR 75 | Temp 97.8°F | Resp 18 | Ht 70.0 in | Wt 249.9 lb

## 2017-06-16 DIAGNOSIS — M25569 Pain in unspecified knee: Secondary | ICD-10-CM

## 2017-06-16 DIAGNOSIS — G8929 Other chronic pain: Secondary | ICD-10-CM | POA: Diagnosis not present

## 2017-06-16 DIAGNOSIS — E291 Testicular hypofunction: Secondary | ICD-10-CM | POA: Diagnosis not present

## 2017-06-16 DIAGNOSIS — C61 Malignant neoplasm of prostate: Secondary | ICD-10-CM | POA: Diagnosis not present

## 2017-06-16 DIAGNOSIS — C7951 Secondary malignant neoplasm of bone: Principal | ICD-10-CM

## 2017-06-16 NOTE — Progress Notes (Signed)
Hematology and Oncology Follow Up Visit  [name] [id number] [birth year] 81 y.o. 06/16/2017 12:26 PM [name], MDVincent, [name],*   Principle Diagnosis: 81 year old gentleman with prostate cancer diagnosed in 2009. His PSA was 3.2 and a Gleason score of 8. 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   Prior Therapy: He was treated with androgen deprivation as well as definitive radiation therapy. His PSA nadir was 0.5 in 2009.   His PSA continues to rise and in 2013 his PSA was 6.42. Biopsy taken at that time which showed no evidence of recurrent disease. Bone scan in June 2014 did not really show any evidence of metastatic disease.   His PSA in February 2015 was up to 36.35 and a fluoride PET scan showed measurable disease in the second rib as well as the 10th thoracic vertebral body. He was started with Mills Koller and has been on Lupron since that time. He had an excellent PSA response initially with a nadir down to 0.08 in April 2016.   His PSA was 0.8 in February 2017 and Casodex was added. In April 2017 his PSA was 0.28 and started to rise again in July 2017. His PSA was 0.44 and subsequently was 1.09 in October 2017. On 10/09/2016 his PSA was 3.39. His testosterone level was 30.4. Casodex was discontinued in April 2018.   Current therapy: Xtandi 160 mg daily started on 04/08/2017.  Interim History: [name] presents today for a follow-up visit. Since the last visit, he reports no major changes in his health.  He continues to take [facility] without complications. He denied any nausea, fatigue or edema.  He denies any pathological fractures or bone pain.  He remains active and continues to attend to activities of daily living. He reports knee pain which is chronic in nature and unchanged from previous examination. His quality of life remain excellent. He denied any neurological  deficits or seizures.  He continues to ambulate without any falls or syncope.  He does not report any headaches, blurry vision, syncope or seizures. He does not report any fevers, chills or sweats. He does not report any cough, wheezing or hemoptysis. He is not reporting nausea, vomiting or abdominal pain. He does not report any chest pain, palpitation, orthopnea or leg edema. He does not report any hematuria, dysuria. He does not report any flank pain, ecchymosis or rash. He does not report lymphadenopathy. Remaining review of systems unremarkable.   Medications: I have reviewed the patient's current medications.  Current Outpatient Medications  Medication Sig Dispense Refill  . amLODipine (NORVASC) 10 MG tablet Take 1 tablet (10 mg total) by mouth daily. (Patient taking differently: Take 5 mg by mouth daily. ) 90 tablet 3  . aspirin EC 81 MG tablet Take 1 tablet (81 mg total) by mouth daily. 150 tablet 2  . hydrochlorothiazide (HYDRODIURIL) 25 MG tablet Take 1 tablet (25 mg total) daily by mouth. 90 tablet 3  . Hypromellose (ARTIFICIAL TEARS OP) Place 1 drop into the left eye daily.    . ILEVRO 0.3 % ophthalmic suspension INSTILL 1 DROP INTO RIGHT EYE EVERY DAY  2  . losartan (COZAAR) 100 MG tablet Take 1 tablet (100 mg total) by mouth daily. Please note change in tablet dose. 90 tablet 3  . losartan (COZAAR) 100 MG tablet TAKE 1 TABLET (100 MG TOTAL) BY MOUTH DAILY. PLEASE NOTE CHANGE IN TABLET DOSE. 90 tablet 3  .  metFORMIN (GLUCOPHAGE-XR) 500 MG 24 hr tablet TAKE 1 TABLET (500 MG TOTAL) BY MOUTH DAILY WITH BREAKFAST. 30 tablet 5  . pravastatin (PRAVACHOL) 20 MG tablet Take 1 tablet (20 mg total) by mouth every evening. (Patient taking differently: Take 20 mg by mouth every morning. ) 90 tablet 3  . XTANDI 40 MG capsule TAKE 4 CAPSULES (160 MG TOTAL) BY MOUTH DAILY. 120 capsule 0   No current facility-administered medications for this visit.      Allergies:  Allergies  Allergen Reactions   . Aspirin Nausea Only    Patient stated,"I get nauseated with higher doses of Aspirin."    Past Medical History, Surgical history, Social history, and Family History were reviewed and updated.  Physical Exam: Blood pressure (!) 169/79, pulse 75, temperature 97.8 F (36.6 C), temperature source Oral, resp. rate 18, height 5\' 10"  (1.778 m), weight 249 lb 14.4 oz (113.4 kg), SpO2 99 %. ECOG: 1 General appearance: Alert, awake gentleman without distress. Head: Normocephalic, without obvious abnormality no oral ulcers or thrush. Neck: no adenopathy Lymph nodes: Cervical, supraclavicular, and axillary nodes normal. Heart:regular rate and rhythm, S1, S2 normal, no murmur, click, rub or gallop Lung:chest clear, no wheezing, rales, normal symmetric air entry Abdomin: soft, non-tender, without masses or organomegaly no rebound or guarding. EXT:no erythema, induration, or nodules   Lab Results: Lab Results  Component Value Date   WBC 5.9 06/14/2017   HGB 13.7 06/14/2017   HCT 41.1 06/14/2017   MCV 92.0 06/14/2017   PLT 163 06/14/2017     Chemistry      Component Value Date/Time   NA 141 06/14/2017 0758   K 3.7 06/14/2017 0758   CL 104 09/09/2016 1501   CO2 23 06/14/2017 0758   BUN 13.2 06/14/2017 0758   CREATININE 1.0 06/14/2017 0758      Component Value Date/Time   CALCIUM 9.7 06/14/2017 0758   ALKPHOS 56 06/14/2017 0758   AST 38 (H) 06/14/2017 0758   ALT 50 06/14/2017 0758   BILITOT 0.42 06/14/2017 0758        Results for [name], [name] I (MRN [id number]) as of 06/16/2017 12:02  Ref. Range 05/03/2017 07:57 06/14/2017 07:58  Prostate Specific Ag, Serum Latest Ref Range: 0.0 - 4.0 ng/mL 2.2 2.0     Impression and Plan:   81 year old gentleman with the following issues:  1. Prostate cancer diagnosed in March 2009. At that time his PSA was 3.2 and a Gleason score 4+4 = 8. He received definitive radiotherapy with androgen deprivation in addition to radiation therapy and  seed implants. He had a PSA nadir to 0.5 in September 2009.  His PSA started to rise again slowly over the last 6 years and in February 2015 his PSA was 36.35. Androgen deprivation was started initially with Mills Koller and has been on Lupron since that time. Casodex was added in February 2017 for a small rise in his PSA.   His PSA continues to rise despite androgen deprivation and Casodex. Casodex withdrawal did not improve his PSA which is currently at 7.7. His bone scan did not show any clear measurable disease although he has previously questionable rib metastasis.  He is currently on Xtandi 160 mg daily without complications.   His PSA continues to decline slowly currently at 2.0.  Risks and benefits of this medication was discussed and he is agreeable to continue.  2. Androgen deprivation therapy: This will be continued indefinitely.  3. Follow-up: Will be 8 weeks to assess his  response and tolerance to this medication.    Zola Button, MD 11/7/201812:26 PM

## 2017-06-16 NOTE — Telephone Encounter (Signed)
Printed avs and calender for upcoming appointment. Per 11/7 los 

## 2017-06-20 ENCOUNTER — Other Ambulatory Visit: Payer: Self-pay | Admitting: Student in an Organized Health Care Education/Training Program

## 2017-06-20 DIAGNOSIS — E119 Type 2 diabetes mellitus without complications: Secondary | ICD-10-CM

## 2017-06-21 ENCOUNTER — Other Ambulatory Visit: Payer: Self-pay

## 2017-06-21 ENCOUNTER — Ambulatory Visit: Payer: Medicare PPO | Admitting: Student in an Organized Health Care Education/Training Program

## 2017-06-21 ENCOUNTER — Encounter: Payer: Self-pay | Admitting: Student in an Organized Health Care Education/Training Program

## 2017-06-21 ENCOUNTER — Other Ambulatory Visit: Payer: Self-pay | Admitting: Oncology

## 2017-06-21 VITALS — BP 155/80 | HR 79 | Temp 97.5°F | Ht 70.0 in | Wt 252.1 lb

## 2017-06-21 DIAGNOSIS — C61 Malignant neoplasm of prostate: Secondary | ICD-10-CM

## 2017-06-21 DIAGNOSIS — E785 Hyperlipidemia, unspecified: Secondary | ICD-10-CM | POA: Diagnosis not present

## 2017-06-21 DIAGNOSIS — I10 Essential (primary) hypertension: Secondary | ICD-10-CM

## 2017-06-21 DIAGNOSIS — E119 Type 2 diabetes mellitus without complications: Secondary | ICD-10-CM

## 2017-06-21 DIAGNOSIS — H833X3 Noise effects on inner ear, bilateral: Secondary | ICD-10-CM

## 2017-06-21 DIAGNOSIS — C7951 Secondary malignant neoplasm of bone: Secondary | ICD-10-CM

## 2017-06-21 DIAGNOSIS — G4733 Obstructive sleep apnea (adult) (pediatric): Secondary | ICD-10-CM | POA: Diagnosis not present

## 2017-06-21 DIAGNOSIS — Z23 Encounter for immunization: Secondary | ICD-10-CM

## 2017-06-21 LAB — POCT GLYCOSYLATED HEMOGLOBIN (HGB A1C): Hemoglobin A1C: 6.2

## 2017-06-21 LAB — GLUCOSE, CAPILLARY: GLUCOSE-CAPILLARY: 103 mg/dL — AB (ref 65–99)

## 2017-06-21 MED ORDER — CHLORTHALIDONE 50 MG PO TABS: 50.0000 mg | ORAL_TABLET | Freq: Every day | ORAL | 3 refills | Status: DC

## 2017-06-21 MED ORDER — AMLODIPINE BESYLATE 10 MG PO TABS: 10.0000 mg | ORAL_TABLET | Freq: Every day | ORAL | 3 refills | Status: DC

## 2017-06-21 NOTE — Assessment & Plan Note (Signed)
Patient with severe sleep apnea which was confirmed on a repeat sleep study in October.  He is currently working on the financing to start home CPAP therapy.  I encouraged him to complete this, as I think it is going to help with a lot of his symptoms of fatigue and dysthymia during the day.

## 2017-06-21 NOTE — Assessment & Plan Note (Signed)
Patient with prostate cancer and likely metastasis to his second rib, T2, and T10 vertebrae's.  He follows with urology and is doing well with medical management on bicalutamide and leuprolide.  His PSA levels have been normal.  He is having significant urinary retention symptoms and is now intermittently self catheterizing.  He says this does not bother him, except that he still gets up at night about 5 times to urinate.  I encouraged him to continue with current treatments, including self-catheterization, and fluid restriction after 5 PM.  We will start by taking his blood pressure medications early in the morning, we may need to discontinue his thiazide diuretic in the future if this continues to be a problem.

## 2017-06-21 NOTE — Assessment & Plan Note (Signed)
Blood pressure is above goal today.  Plan is to continue with losartan 100 mg daily and amlodipine 10 mg daily.  I am going to change hydrochlorothiazide 25 mg to chlorthalidone 50 mg daily.  Repeat blood pressure in 3 months.

## 2017-06-21 NOTE — Progress Notes (Signed)
   Assessment and Plan:  See Encounters tab for problem-based medical decision making.   __________________________________________________________  HPI:   81 year old man here for follow-up of diabetes and hypertension.  He reports doing very well at home.  Only complaint today is of fatigue and low mood at home.  Recently retired from his job which is been difficult for him.  He is under financial pressures and has a very fixed income and difficulty affording current medications and therapies.  He reports good compliance with his current medications, no side effects.  Not getting much exercise since he quit working.  Lives with his wife, still having trouble with tearing in his right eye.  He stopped going to see ophthalmology because he did not find much benefit to the injection therapy and they were doing.  He follows closely with his urologist over his prostate cancer is on medical therapy.  Continues to report significant lower urinary tract symptoms, gets up to go to the bathroom about 5 times nightly.  Had good follow-up with the sleep study, wants to reinitiate CPAP but has difficulties with the cost, says that it would cost $80 upfront and then there is a monthly fee after that. No fevers or chills, no recent admissions or emergency department visits, no chest pain, lightheadedness, blurry vision is resolved, no dyspnea on exertion.  __________________________________________________________  Problem List: Patient Active Problem List   Diagnosis Date Noted  . Prostate cancer metastatic to bone ([place]) 04/03/2009    Priority: High  . Essential hypertension 07/09/2006    Priority: High  . Diabetes ([place]) 06/01/2016    Priority: Medium  . Hearing loss 11/30/2016    Priority: Low  . Hyperlipidemia 05/20/2015    Priority: Low  . Obstructive sleep apnea 07/06/2007    Priority: Low  . Osteoarthritis of left knee 07/09/2006    Priority: Low    Medications: Reconciled today in  Epic __________________________________________________________  Physical Exam:  Vital Signs: Vitals:   06/21/17 0907  BP: (!) 155/80  Pulse: 79  Temp: (!) 97.5 F (36.4 C)  TempSrc: Oral  SpO2: 100%  Weight: 252 lb 1.6 oz (114.4 kg)  Height: 5\' 10"  (1.778 m)    Gen: Well appearing, NAD CV: RRR, no murmurs Pulm: Normal effort, CTA throughout, no wheezing Abd: Soft, NT, ND Ext: Warm, no edema, normal joints Skin: No atypical appearing moles. No rashes

## 2017-06-21 NOTE — Assessment & Plan Note (Signed)
Patient with 1 year history of stable hearing loss.  I referred him to ENT for hearing aid evaluation last spring but it looks like he missed his initial appointment in May.  We call over there to try to make him another appointment, he was agreeable.  I am not sure what the miscommunication was about his appointment time.

## 2017-06-21 NOTE — Patient Instructions (Addendum)
1. Keep taking your medications as prescribed.  2. Try to arrange the CPAP machine at home soon. It may help your sleep quality tremendously.   3. We will help you to see the Ear doctors to see if hearing aids would be right for you.

## 2017-06-21 NOTE — Assessment & Plan Note (Signed)
Hemoglobin A1c 6.2% today.  At goal.  Plan is to continue with metformin 500 mg XR daily.  Continue with aspirin and pravastatin for secondary prevention of ischemic vascular disease given history of cranial nerve VI palsy.

## 2017-06-23 MED FILL — XTANDI 40 MG CAPSULE: 40 | 30 days supply | Qty: 120 | Fill #0

## 2017-06-28 DIAGNOSIS — H903 Sensorineural hearing loss, bilateral: Secondary | ICD-10-CM | POA: Insufficient documentation

## 2017-07-12 ENCOUNTER — Ambulatory Visit (INDEPENDENT_AMBULATORY_CARE_PROVIDER_SITE_OTHER): Payer: Medicare PPO | Admitting: Internal Medicine

## 2017-07-12 ENCOUNTER — Encounter: Payer: Self-pay | Admitting: Internal Medicine

## 2017-07-12 VITALS — BP 129/70 | HR 80 | Temp 97.5°F | Ht 70.0 in | Wt 245.0 lb

## 2017-07-12 DIAGNOSIS — E785 Hyperlipidemia, unspecified: Secondary | ICD-10-CM

## 2017-07-12 DIAGNOSIS — I10 Essential (primary) hypertension: Secondary | ICD-10-CM | POA: Diagnosis not present

## 2017-07-12 DIAGNOSIS — Z87891 Personal history of nicotine dependence: Secondary | ICD-10-CM

## 2017-07-12 DIAGNOSIS — G44209 Tension-type headache, unspecified, not intractable: Secondary | ICD-10-CM | POA: Diagnosis not present

## 2017-07-12 DIAGNOSIS — C61 Malignant neoplasm of prostate: Secondary | ICD-10-CM | POA: Diagnosis not present

## 2017-07-12 DIAGNOSIS — R42 Dizziness and giddiness: Secondary | ICD-10-CM

## 2017-07-12 MED ORDER — KETOROLAC TROMETHAMINE 30 MG/ML IJ SOLN
30.0000 mg | Freq: Once | INTRAMUSCULAR | Status: AC
  Administered 2017-07-12: 30 mg via INTRAMUSCULAR

## 2017-07-12 MED ORDER — CYCLOBENZAPRINE HCL 5 MG PO TABS: 5.0000 mg | ORAL_TABLET | Freq: Three times a day (TID) | ORAL | 0 refills | Status: DC | PRN

## 2017-07-12 NOTE — Progress Notes (Signed)
129/70 

## 2017-07-12 NOTE — Progress Notes (Signed)
CC: Bilateral shoulder, neck, and head pain  HPI:  [name] is a 81 y.o. with PMH of HTN, HLD, and metastatic prostate cancer who presents for evaluation of bilateral shoulder, neck, and head pain that started about 5 days ago. The patient states that he first noticed the pain while laying in bed at night. Since he first noticed pain it has been constant. The pain starts in his neck and bilateral shoulders. It radiates to the back of his head and extends through the sides of his head into his bilateral forehead. The pain is worse on L side compared to the R. The pain is worse when he turns his head to the left or right. No meningismus, fevers, or chills. Patient denies nausea, vomiting, photophobia, phonophobia, focal weakness, difficulties with speech, and numbness or tingling of upper or lower extremities. Patient denies local trauma and recent falls. His pain improves slightly with use of OTC Tylenol, but has significantly impaired his ability to sleep at night and move around throughout day. Because of this the patient decided to seek evaluation.   During this time patient also notes intermittent "dizziness" when standing from a seated position which self resolves when he stands still for a few seconds. He describes this dizziness as a feeling of being unsteady, like he will fall. He denies feeling like the room is spinning or feeling like he is spinning. This phenomenon started 1-2 months before the bilateral shoulder, neck, and head pain. It has not gotten worse since his pain developed. The patient reports walking with his cane more because of this intermittent unsteadiness when standing from seated position.   Per chart review patient had a Brain MRI in 09/2016 which did not show any acute intracranial abnormalities. He also had a bone scan in 12/2016 which did not show any changes relative to his previous scan in 2017.  Past Medical History:  Diagnosis Date  . Adenomatous colon  polyp 12/14/2005   5 mm polyp in descending colon, found on colonoscopy by Dr. Wilford Corner  . Allergic rhinitis   . Asymptomatic cholelithiasis 11/30/2014  . Bladder calculi   . Bladder cancer ([facility]) 2010   S/P TURBT   . Bladder neck contracture   . Enlarged heart    "slight" (09/09/2016)  . GERD (gastroesophageal reflux disease)   . Hand eczema   . Hepatitis, unspecified PT STATES HAD HEPATITIS APPROX 1980'S ; UNSURE WHAT TYPE   Hepatitis C RNA Quant on July 06, 2007 showed no detectable virus.  . High cholesterol   . History of kidney stones   . Hypertension   . Nocturia   . OSA on CPAP    Severe by diagnostic polysomnogram 06/22/2004.  Marland Kitchen Prostate cancer ([place]) 2009   S/P EXTERNAL RADIATION, SEED IMPLANTS  . Self-catheterizes urinary bladder    "q couple weeks prn" (09/09/2016)  . Sleep apnea    "don't use the mask anymore" (09/09/2016)   Review of Systems:   Patient endorses head, shoulder, and neck pain, as per HPI Patient denies chest pain, shortness of breath, abdominal pain, diaphoresis, nausea/vomiting, lower extremity swelling, and change in bowel/bladder habits.  Physical Exam:  Vitals:   07/12/17 1051  BP: 129/70  Pulse: 80  Temp: (!) 97.5 F (36.4 C)  TempSrc: Oral  SpO2: 97%  Weight: 245 lb (111.1 kg)  Height: 5\' 10"  (1.778 m)   Physical Exam  Constitutional: He appears well-developed and well-nourished.  Eyes: Conjunctivae and EOM are normal. Pupils  are equal, round, and reactive to light.  Cardiovascular: Normal rate, regular rhythm and intact distal pulses. Exam reveals no friction rub.  No murmur heard. Respiratory: Effort normal. No respiratory distress. He has no wheezes.  Musculoskeletal: He exhibits no edema, tenderness (of cervical and upper thoracic spinous processes) or deformity.  Neck ROM testing limited 2/2 pain  Lymphadenopathy:    He has no cervical adenopathy.  Neurological:  Face strength and sensation intact bilaterally. Tongue  midline. Gross motor and sensation to light touch of upper and lower extremities intact bilaterally. RAM within normal limits bilaterally.  Skin: Skin is warm and dry. No rash noted. No erythema.   Assessment & Plan:   See Encounters Tab for problem based charting.  Patient seen with Dr. Dareen Piano

## 2017-07-12 NOTE — Progress Notes (Signed)
Internal Medicine Clinic Attending  I saw and evaluated the patient.  I personally confirmed the key portions of the history and exam documented by Dr. Nedrud and I reviewed pertinent patient test results.  The assessment, diagnosis, and plan were formulated together and I agree with the documentation in the resident's note.  

## 2017-07-12 NOTE — Patient Instructions (Addendum)
FOLLOW-UP INSTRUCTIONS When: 1 week if your headache is not better For: Reevaluation of head, neck, and shoulder pain  Thank you for seeing [place] in the clinic today!  You were evaluated for head, neck and shoulder pain.  Please return to the clinic in 1 week for follow up of your pain if your pain does not improve with the medications given to you during clinic today. Please take ibuprofen 800 mg, up to 3 times a day for your pain at home. You were prescribed cyclobenzaprine, which is a muscle relaxer. You can take up to three tablets per day for your pain.   IF YOUR PAIN DOES NOT IMPROVE BY NEXT WEEK WITH THESE MEDICATIONS, PLEASE RETURN TO CLINIC FOR EVALUATION.  If you have any questions or concerns, please call our clinic at [phone] between 9am-5pm and after hours call [phone] and ask for the internal medicine resident on call. If you feel you are having a medical emergency please call 911.   Dr. Thomasene Ripple

## 2017-07-12 NOTE — Assessment & Plan Note (Addendum)
Pain has been constant since it acutely developed 5 days ago. Pain originates in muscles around shoulder (L>R) and wraps up to back of head into forehead. Not associated with changes in vision, nausea/vomiting, photophobia, phonophobia. His description of symptoms and physical exam with tenderness in trapezius muscles are most consistent with tension headache. Patient will be given IM Toradol for treatment of his acute MSK pain today (last Cr 1.0) and prescription for short term muscle relaxer to alleviate paraspinal muscle tension. The patient will also be told to continue use of OTC analgesic medications, such as ibuprofen, to alleviate tension headache. Patient given strict return precautions to seek care in clinic if pain does not improve with these interventions or if he develops new nausea/vomiting or weakness/numbness in upper/lower extremities.  The patient does not have any focal neuro deficits on exam, which is reassuring that an acute intracranial process such as metastatic tumor is contributing to his constellation of symptoms. The patient also does not have any focal point tenderness or numbness/tingling in his arms to suggest new compression fracture in C spine as a cause of his symptoms. Given this will defer imaging at this time (especially since patient had negative brain MRI in 09/2016 with recent negative bone scan in 12/2016). However since the patient does have history of metastatic prostate cancer, it would be important to consider further imaging of brain and C spine if symptoms persist.   Plan: -Toradol, 30 mg IM in clinic today -Cyclobenzaprine 5 mg TID PRN for 5 days -RTC if symptoms not improved within 7 days for reevaluation and possible further imaging

## 2017-07-15 ENCOUNTER — Other Ambulatory Visit: Payer: Self-pay | Admitting: Oncology

## 2017-07-15 DIAGNOSIS — C7951 Secondary malignant neoplasm of bone: Principal | ICD-10-CM

## 2017-07-15 DIAGNOSIS — C61 Malignant neoplasm of prostate: Secondary | ICD-10-CM

## 2017-07-28 MED FILL — XTANDI 40 MG CAPSULE: 40 | 30 days supply | Qty: 120 | Fill #0

## 2017-08-11 ENCOUNTER — Other Ambulatory Visit: Payer: Medicare PPO

## 2017-08-12 ENCOUNTER — Ambulatory Visit (HOSPITAL_BASED_OUTPATIENT_CLINIC_OR_DEPARTMENT_OTHER): Payer: Medicare Other

## 2017-08-12 ENCOUNTER — Ambulatory Visit (HOSPITAL_BASED_OUTPATIENT_CLINIC_OR_DEPARTMENT_OTHER): Payer: Medicare Other | Admitting: Oncology

## 2017-08-12 ENCOUNTER — Telehealth: Payer: Self-pay | Admitting: Oncology

## 2017-08-12 VITALS — BP 136/71 | HR 63 | Temp 97.6°F | Resp 18 | Ht 70.0 in | Wt 249.5 lb

## 2017-08-12 DIAGNOSIS — C61 Malignant neoplasm of prostate: Secondary | ICD-10-CM

## 2017-08-12 DIAGNOSIS — M25569 Pain in unspecified knee: Secondary | ICD-10-CM

## 2017-08-12 DIAGNOSIS — E291 Testicular hypofunction: Secondary | ICD-10-CM | POA: Diagnosis not present

## 2017-08-12 DIAGNOSIS — C7951 Secondary malignant neoplasm of bone: Secondary | ICD-10-CM

## 2017-08-12 LAB — CBC WITH DIFFERENTIAL/PLATELET
BASO%: 0.2 % (ref 0.0–2.0)
Basophils Absolute: 0 10*3/uL (ref 0.0–0.1)
EOS%: 2.1 % (ref 0.0–7.0)
Eosinophils Absolute: 0.1 10*3/uL (ref 0.0–0.5)
HEMATOCRIT: 38.6 % (ref 38.4–49.9)
HGB: 13.4 g/dL (ref 13.0–17.1)
LYMPH%: 55 % — ABNORMAL HIGH (ref 14.0–49.0)
MCH: 31.5 pg (ref 27.2–33.4)
MCHC: 34.7 g/dL (ref 32.0–36.0)
MCV: 90.8 fL (ref 79.3–98.0)
MONO#: 0.6 10*3/uL (ref 0.1–0.9)
MONO%: 9.3 % (ref 0.0–14.0)
NEUT%: 33.4 % — AB (ref 39.0–75.0)
NEUTROS ABS: 2.1 10*3/uL (ref 1.5–6.5)
PLATELETS: 174 10*3/uL (ref 140–400)
RBC: 4.25 10*6/uL (ref 4.20–5.82)
RDW: 13.8 % (ref 11.0–14.6)
WBC: 6.3 10*3/uL (ref 4.0–10.3)
lymph#: 3.5 10*3/uL — ABNORMAL HIGH (ref 0.9–3.3)

## 2017-08-12 LAB — COMPREHENSIVE METABOLIC PANEL
ALT: 43 U/L (ref 0–55)
ANION GAP: 9 meq/L (ref 3–11)
AST: 36 U/L — AB (ref 5–34)
Albumin: 3.8 g/dL (ref 3.5–5.0)
Alkaline Phosphatase: 62 U/L (ref 40–150)
BILIRUBIN TOTAL: 0.34 mg/dL (ref 0.20–1.20)
BUN: 14.9 mg/dL (ref 7.0–26.0)
CALCIUM: 9.5 mg/dL (ref 8.4–10.4)
CO2: 27 mEq/L (ref 22–29)
CREATININE: 1 mg/dL (ref 0.7–1.3)
Chloride: 104 mEq/L (ref 98–109)
EGFR: >60 mL/min/{1.73_m2} (ref >60)
Glucose: 93 mg/dl (ref 70–140)
Potassium: 3.4 mEq/L — ABNORMAL LOW (ref 3.5–5.1)
Sodium: 141 mEq/L (ref 136–145)
Total Protein: 7.8 g/dL (ref 6.4–8.3)

## 2017-08-12 NOTE — Telephone Encounter (Signed)
Scheduled appt per 1/3 los - Gave patient AVS and calender per los.  

## 2017-08-12 NOTE — Progress Notes (Signed)
Hematology and Oncology Follow Up Visit  CORDARRYL MONRREAL [id number] [birth year] 82 y.o. 08/12/2017 12:20 PM [name], MDVincent, [name],*   Principle Diagnosis: 82 year old gentleman with prostate cancer diagnosed in 2009. His PSA was 3.2 and a Gleason score of 8. 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   Prior Therapy: He was treated with androgen deprivation as well as definitive radiation therapy. His PSA nadir was 0.5 in 2009.   His PSA continues to rise and in 2013 his PSA was 6.42. Biopsy taken at that time which showed no evidence of recurrent disease. Bone scan in June 2014 did not really show any evidence of metastatic disease.   His PSA in February 2015 was up to 36.35 and a fluoride PET scan showed measurable disease in the second rib as well as the 10th thoracic vertebral body. He was started with Mills Koller and has been on Lupron since that time. He had an excellent PSA response initially with a nadir down to 0.08 in April 2016.   His PSA was 0.8 in February 2017 and Casodex was added. In April 2017 his PSA was 0.28 and started to rise again in July 2017. His PSA was 0.44 and subsequently was 1.09 in October 2017. On 10/09/2016 his PSA was 3.39. His testosterone level was 30.4. Casodex was discontinued in April 2018.   Current therapy: Xtandi 160 mg daily started on 04/08/2017.  Interim History: Mr. Cicero presents today for a follow-up visit. Since the last visit, he reports doing well without any complaints. He continues to take [facility] without complications. He denied any nausea, fatigue or difficulty taking this medication.  He denies any pathological fractures or bone pain.  He remains active without any decline in his quality of life. He reports knee pain which is chronic in nature and unchanged from previous examination.  He continues to ambulate without any falls or  syncope.  He does not report any headaches, blurry vision, syncope or seizures. He does not report any fevers, chills or sweats. He does not report any cough, wheezing or hemoptysis. He is not reporting nausea, vomiting or abdominal pain. He does not report any chest pain, palpitation, orthopnea or leg edema. He does not report any hematuria, dysuria. He does not report any flank pain, ecchymosis or rash. He does not report lymphadenopathy. Remaining review of systems unremarkable.   Medications: I have reviewed the patient's current medications.  Current Outpatient Medications  Medication Sig Dispense Refill  . amLODipine (NORVASC) 10 MG tablet Take 1 tablet (10 mg total) daily by mouth. 90 tablet 3  . aspirin EC 81 MG tablet Take 1 tablet (81 mg total) by mouth daily. 150 tablet 2  . chlorthalidone (HYGROTON) 50 MG tablet Take 1 tablet (50 mg total) daily by mouth. 90 tablet 3  . Hypromellose (ARTIFICIAL TEARS OP) Place 1 drop into the left eye daily.    . ILEVRO 0.3 % ophthalmic suspension INSTILL 1 DROP INTO RIGHT EYE EVERY DAY  2  . losartan (COZAAR) 100 MG tablet Take 1 tablet (100 mg total) by mouth daily. Please note change in tablet dose. 90 tablet 3  . metFORMIN (GLUCOPHAGE-XR) 500 MG 24 hr tablet TAKE 1 TABLET (500 MG TOTAL) BY MOUTH DAILY WITH BREAKFAST. 30 tablet 5  . pravastatin (PRAVACHOL) 20 MG tablet Take 1 tablet (20 mg total) by mouth every evening. (Patient taking differently: Take 20 mg by mouth every  morning. ) 90 tablet 3  . XTANDI 40 MG capsule TAKE 4 CAPSULES (160 MG TOTAL) BY MOUTH DAILY. 120 capsule 0   No current facility-administered medications for this visit.      Allergies:  Allergies  Allergen Reactions  . Aspirin Nausea Only    Patient stated,"I get nauseated with higher doses of Aspirin."    Past Medical History, Surgical history, Social history, and Family History were reviewed and updated.  Physical Exam: Blood pressure 136/71, pulse 63, temperature  97.6 F (36.4 C), temperature source Oral, resp. rate 18, height 5\' 10"  (1.778 m), weight 249 lb 8 oz (113.2 kg), SpO2 100 %. ECOG: 1 General appearance: Well-appearing gentleman without distress. Head: Normocephalic, without obvious abnormality no oral ulcers or lesions. Neck: no adenopathy or masses. Lymph nodes: Cervical, supraclavicular, and axillary nodes normal. Heart:regular rate and rhythm, S1, S2 normal, no murmur, click, rub or gallop Lung:chest clear, no wheezing, rales, normal symmetric air entry Abdomin: soft, non-tender, without masses or organomegaly no shifting dullness or ascites. EXT:no erythema, induration, or nodules   Lab Results: Lab Results  Component Value Date   WBC 5.9 06/14/2017   HGB 13.7 06/14/2017   HCT 41.1 06/14/2017   MCV 92.0 06/14/2017   PLT 163 06/14/2017     Chemistry      Component Value Date/Time   NA 141 06/14/2017 0758   K 3.7 06/14/2017 0758   CL 104 09/09/2016 1501   CO2 23 06/14/2017 0758   BUN 13.2 06/14/2017 0758   CREATININE 1.0 06/14/2017 0758      Component Value Date/Time   CALCIUM 9.7 06/14/2017 0758   ALKPHOS 56 06/14/2017 0758   AST 38 (H) 06/14/2017 0758   ALT 50 06/14/2017 0758   BILITOT 0.42 06/14/2017 0758      Results for CHRISTPOHER, SIEVERS I (MRN [id number]) as of 08/12/2017 12:01  Ref. Range 03/24/2017 08:12 05/03/2017 07:57 06/14/2017 07:58  Prostate Specific Ag, Serum Latest Ref Range: 0.0 - 4.0 ng/mL 7.7 (H) 2.2 2.0     Impression and Plan:   82 year old gentleman with the following issues:  1. Prostate cancer diagnosed in March 2009. At that time his PSA was 3.2 and a Gleason score 4+4 = 8. He received definitive radiotherapy with androgen deprivation in addition to radiation therapy and seed implants. He had a PSA nadir to 0.5 in September 2009.  His PSA started to rise again slowly over the last 6 years and in February 2015 his PSA was 36.35. Androgen deprivation was started initially with Mills Koller and  has been on Lupron since that time. Casodex was added in February 2017 for a small rise in his PSA.   His PSA continues to rise despite androgen deprivation and Casodex. Casodex withdrawal did not improve his PSA with increase his PSA up to 7.7.  He is currently on Xtandi 160 mg daily and started on April 08, 2017.  He had an excellent response to his PSA and his disease remained under excellent control.  Risks and benefits of continuing this medication was reviewed today is agreeable to continue.  2. Androgen deprivation therapy: This will be continued indefinitely.  Receiving this under the care of Dr. Diona Fanti.  3. Bone directed therapy: His disease has been limited and will address this in the future if needed to feel develops worsening bony disease.  4. Follow-up: Will be 8 weeks to follow his progress.    Zola Button, MD 1/3/201912:20 PM

## 2017-08-13 ENCOUNTER — Telehealth: Payer: Self-pay | Admitting: *Deleted

## 2017-08-13 LAB — PSA: PROSTATE SPECIFIC AG, SERUM: 2.6 ng/mL (ref 0.0–4.0)

## 2017-08-13 NOTE — Telephone Encounter (Signed)
-----   Message from Wyatt Portela, MD sent at 08/13/2017  8:12 AM EST ----- Please let him know his PSA is slightly up but no change for now.

## 2017-08-13 NOTE — Telephone Encounter (Signed)
Spoke with patient, gave results of last PSA 

## 2017-08-16 ENCOUNTER — Other Ambulatory Visit: Payer: Self-pay | Admitting: Internal Medicine

## 2017-08-16 ENCOUNTER — Other Ambulatory Visit: Payer: Self-pay | Admitting: Student in an Organized Health Care Education/Training Program

## 2017-08-16 NOTE — Telephone Encounter (Signed)
Regarding flexeril refill request- was ordered 07/12/17 as PRN for 5 days & return for f/u if no improvement.  Need to know if needs refill & if so will need f/u scheduled. LM to call us back.

## 2017-08-17 ENCOUNTER — Other Ambulatory Visit: Payer: Self-pay | Admitting: Student in an Organized Health Care Education/Training Program

## 2017-08-17 NOTE — Telephone Encounter (Signed)
Confirmed with patient's wife- no longer with [facility] & has switched to [facility]. Informed pravastatin has been ordered. [employer] deleted from profile.

## 2017-08-17 NOTE — Telephone Encounter (Signed)
New refill request received today for pravastatin 20mg  from [name].  Refill was authorized yesterday by pcp but sent to [id number] - [place], [place].  I attempted to contact patient to confirm pharmacy, no answer-HIPPA compliant message left on recorder.Regenia Skeeter, Darlene Cassady1/8/20192:48 PM

## 2017-08-17 NOTE — Telephone Encounter (Signed)
Per patient's wife, patient still having some pain. Although has improved from last OV, still would like a refill on flexeril. Sent to pcp for review.

## 2017-08-19 ENCOUNTER — Other Ambulatory Visit: Payer: Self-pay | Admitting: Oncology

## 2017-08-19 DIAGNOSIS — C61 Malignant neoplasm of prostate: Secondary | ICD-10-CM

## 2017-08-19 DIAGNOSIS — C7951 Secondary malignant neoplasm of bone: Principal | ICD-10-CM

## 2017-08-20 ENCOUNTER — Ambulatory Visit: Payer: Self-pay | Admitting: Nurse Practitioner

## 2017-08-20 ENCOUNTER — Other Ambulatory Visit: Payer: Self-pay | Admitting: Student in an Organized Health Care Education/Training Program

## 2017-08-20 NOTE — Telephone Encounter (Signed)
Not on current med list; ? Discontinued 06/21/17. Thanks

## 2017-08-27 MED FILL — XTANDI 40 MG CAPSULE: 40 | 30 days supply | Qty: 120 | Fill #0

## 2017-08-31 ENCOUNTER — Other Ambulatory Visit: Payer: Self-pay | Admitting: Student in an Organized Health Care Education/Training Program

## 2017-09-03 ENCOUNTER — Other Ambulatory Visit: Payer: Self-pay | Admitting: Student in an Organized Health Care Education/Training Program

## 2017-09-13 ENCOUNTER — Other Ambulatory Visit: Payer: Self-pay | Admitting: Student in an Organized Health Care Education/Training Program

## 2017-09-13 DIAGNOSIS — N35012 Post-traumatic membranous urethral stricture: Secondary | ICD-10-CM | POA: Diagnosis not present

## 2017-09-13 DIAGNOSIS — C61 Malignant neoplasm of prostate: Secondary | ICD-10-CM | POA: Diagnosis not present

## 2017-09-13 DIAGNOSIS — R9721 Rising PSA following treatment for malignant neoplasm of prostate: Secondary | ICD-10-CM | POA: Diagnosis not present

## 2017-09-13 DIAGNOSIS — R3129 Other microscopic hematuria: Secondary | ICD-10-CM | POA: Diagnosis not present

## 2017-09-13 DIAGNOSIS — R300 Dysuria: Secondary | ICD-10-CM | POA: Diagnosis not present

## 2017-09-22 ENCOUNTER — Other Ambulatory Visit: Payer: Self-pay | Admitting: Oncology

## 2017-09-22 DIAGNOSIS — C7951 Secondary malignant neoplasm of bone: Principal | ICD-10-CM

## 2017-09-22 DIAGNOSIS — C61 Malignant neoplasm of prostate: Secondary | ICD-10-CM

## 2017-09-24 MED FILL — XTANDI 40 MG CAPSULE: 40 | 30 days supply | Qty: 120 | Fill #0

## 2017-10-12 ENCOUNTER — Inpatient Hospital Stay: Payer: Medicare Other

## 2017-10-12 ENCOUNTER — Inpatient Hospital Stay: Payer: Medicare Other | Attending: Oncology | Admitting: Oncology

## 2017-10-12 ENCOUNTER — Telehealth: Payer: Self-pay

## 2017-10-12 VITALS — BP 136/69 | HR 63 | Temp 97.5°F | Resp 18 | Ht 70.0 in | Wt 242.2 lb

## 2017-10-12 DIAGNOSIS — C61 Malignant neoplasm of prostate: Secondary | ICD-10-CM | POA: Diagnosis not present

## 2017-10-12 DIAGNOSIS — C7951 Secondary malignant neoplasm of bone: Secondary | ICD-10-CM | POA: Diagnosis not present

## 2017-10-12 DIAGNOSIS — E291 Testicular hypofunction: Secondary | ICD-10-CM | POA: Diagnosis not present

## 2017-10-12 LAB — COMPREHENSIVE METABOLIC PANEL
ALBUMIN: 3.9 g/dL (ref 3.5–5.0)
ALT: 45 U/L (ref 0–55)
AST: 39 U/L — AB (ref 5–34)
Alkaline Phosphatase: 53 U/L (ref 40–150)
Anion gap: 10 (ref 3–11)
BUN: 15 mg/dL (ref 7–26)
CHLORIDE: 103 mmol/L (ref 98–109)
CO2: 26 mmol/L (ref 22–29)
Calcium: 10 mg/dL (ref 8.4–10.4)
Creatinine, Ser: 1.01 mg/dL (ref 0.70–1.30)
GFR calc Af Amer: >60 mL/min (ref >60)
GFR calc non Af Amer: >60 mL/min (ref >60)
Glucose, Bld: 76 mg/dL (ref 70–140)
POTASSIUM: 3.2 mmol/L — AB (ref 3.5–5.1)
SODIUM: 139 mmol/L (ref 136–145)
Total Bilirubin: 0.6 mg/dL (ref 0.2–1.2)
Total Protein: 8 g/dL (ref 6.4–8.3)

## 2017-10-12 LAB — CBC WITH DIFFERENTIAL/PLATELET
Basophils Absolute: 0 10*3/uL (ref 0.0–0.1)
Basophils Relative: 0 %
EOS PCT: 2 %
Eosinophils Absolute: 0.1 10*3/uL (ref 0.0–0.5)
HCT: 39.4 % (ref 38.4–49.9)
HEMOGLOBIN: 13.6 g/dL (ref 13.0–17.1)
LYMPHS ABS: 4.3 10*3/uL — AB (ref 0.9–3.3)
LYMPHS PCT: 56 %
MCH: 31.3 pg (ref 27.2–33.4)
MCHC: 34.5 g/dL (ref 32.0–36.0)
MCV: 90.8 fL (ref 79.3–98.0)
Monocytes Absolute: 0.6 10*3/uL (ref 0.1–0.9)
Monocytes Relative: 9 %
Neutro Abs: 2.4 10*3/uL (ref 1.5–6.5)
Neutrophils Relative %: 33 %
Platelets: 211 10*3/uL (ref 140–400)
RBC: 4.34 MIL/uL (ref 4.20–5.82)
RDW: 13.3 % (ref 11.0–14.6)
WBC: 7.5 10*3/uL (ref 4.0–10.3)

## 2017-10-12 NOTE — Telephone Encounter (Signed)
Printed avs and calender of upcoming appointmernt. Per 3/5 los

## 2017-10-12 NOTE — Progress Notes (Signed)
Hematology and Oncology Follow Up Visit  [name] [id number] 04/12/1936 82 y.o. 10/12/2017 12:01 PM [name], MDVincent, [name],*   Principle Diagnosis: 82 year old man with castration resistant metastatic prostate cancer with disease to the bone. He was initially diagnosed in 2009 with PSA was 3.2 and a Gleason score of 8.    Prior Therapy: He was treated with androgen deprivation as well as definitive radiation therapy. His PSA nadir was 0.5 in 2009.   He developed advanced disease withPSA in February 2015 was up to 36.35 and a fluoride PET scan showed measurable disease in the second rib as well as the 10th thoracic vertebral body. He has been on androgen deprivation since that time. He had an excellent PSA response initially with a nadir down to 0.08 in April 2016.   His PSA was 0.8 in February 2017 and Casodex was added. In April 2017 his PSA was 0.28 and started to rise again in July 2017. His PSA was 0.44 and subsequently was 1.09 in October 2017. On 10/09/2016 his PSA was 3.39. His testosterone level was 30.4. Casodex was discontinued in April 2018. His PSA was 7.7 in August 2018.   Current therapy:   Xtandi 160 mg daily started on 04/08/2017.  Androgen deprivation therapy under the care of Dr. Diona Fanti.  Interim History: [name] is here for a follow-up visit. He reports no major changes in his health since the last visit. He does report increased fatigue currently a grade 1. Despite the fatigue is able to perform activities of daily living without any major decline. He denied any falls or excessive tiredness. He did report to decrease in his appetite and lost some weight. He denied any dysphasia or odynophagia. He denied nausea or vomiting.  He continues to take extending at 160 mg daily without any dose adjustments. He denied any missing doses or compliance issues. He denied any bone pain or pathological fractures. He denied any hematuria.  He does  not report any headaches, blurry vision, syncope or seizures. He does not report any fevers, chills or sweats. He does not report any cough, wheezing or hemoptysis. He is not reporting nausea, vomiting or abdominal pain. He does not report any chest pain, palpitation, orthopnea or leg edema. He does not report any hematuria, dysuria. He does not report any ecchymosis or rash. He does not report lymphadenopathy. He does not report any one-sided depression. He does not report any heat or cold sensitivity. Remaining review of systems is negative.  Medications: I have reviewed the patient's current medications.  Current Outpatient Medications  Medication Sig Dispense Refill  . amLODipine (NORVASC) 10 MG tablet Take 1 tablet (10 mg total) daily by mouth. 90 tablet 3  . chlorthalidone (HYGROTON) 50 MG tablet Take 1 tablet (50 mg total) daily by mouth. 90 tablet 3  . cyclobenzaprine (FLEXERIL) 5 MG tablet TAKE AS DIRECTED 15 tablet 0  . Hypromellose (ARTIFICIAL TEARS OP) Place 1 drop into the left eye daily.    . ILEVRO 0.3 % ophthalmic suspension INSTILL 1 DROP INTO RIGHT EYE EVERY DAY  2  . losartan (COZAAR) 100 MG tablet Take 1 tablet (100 mg total) by mouth daily. Please note change in tablet dose. 90 tablet 3  . metFORMIN (GLUCOPHAGE-XR) 500 MG 24 hr tablet TAKE 1 TABLET (500 MG TOTAL) BY MOUTH DAILY WITH BREAKFAST. 30 tablet 5  . pravastatin (PRAVACHOL) 20 MG tablet TAKE 1 TABLET (20 MG TOTAL) BY MOUTH EVERY EVENING. 30 tablet 3  .  XTANDI 40 MG capsule TAKE 4 CAPSULES (160 MG TOTAL) BY MOUTH DAILY. 120 capsule 0   No current facility-administered medications for this visit.      Allergies:  Allergies  Allergen Reactions  . Aspirin Nausea Only    Patient stated,"I get nauseated with higher doses of Aspirin."    Past Medical History, Surgical history, Social history, and Family History updated today without any change.  Physical Exam: Blood pressure 136/69, pulse 63, temperature (!) 97.5 F  (36.4 C), temperature source Oral, resp. rate 18, height 5\' 10"  (1.778 m), weight 242 lb 3.2 oz (109.9 kg), SpO2 100 %. ECOG: 1 General appearance: Alert, awake gentleman appeared comfortable. Head: Normocephalic, without obvious abnormality  Oropharynx: no thrush or ulcers. Eyes: No scleral icterus. Lymph nodes: Cervical, supraclavicular, and axillary nodes normal. Heart:regular rate and rhythm, S1, S2 normal, no murmur, click, rub or gallop Lung: Clear to auscultation without any rhonchi, wheezes or dullness to percussion. Abdomin: Soft, nontender without any rebound or guarding. No shifting dullness or ascites. Musculoskeletal: No joint deformity or effusion. Neurological: No deficits.   Lab Results: Lab Results  Component Value Date   WBC 7.5 10/12/2017   HGB 13.6 10/12/2017   HCT 39.4 10/12/2017   MCV 90.8 10/12/2017   PLT 211 10/12/2017     Chemistry      Component Value Date/Time   NA 141 08/12/2017 1258   K 3.4 (L) 08/12/2017 1258   CL 104 09/09/2016 1501   CO2 27 08/12/2017 1258   BUN 14.9 08/12/2017 1258   CREATININE 1.0 08/12/2017 1258      Component Value Date/Time   CALCIUM 9.5 08/12/2017 1258   ALKPHOS 62 08/12/2017 1258   AST 36 (H) 08/12/2017 1258   ALT 43 08/12/2017 1258   BILITOT 0.34 08/12/2017 1258      Results for [name], [name] I (MRN [id number]) as of 10/12/2017 11:51  Ref. Range 03/24/2017 08:12 05/03/2017 07:57 06/14/2017 07:58 08/12/2017 12:58  Prostate Specific Ag, Serum Latest Ref Range: 0.0 - 4.0 ng/mL 7.7 (H) 2.2 2.0 2.6      Impression and Plan:   82 year old gentleman with the following issues:  1. Castration resistant advanced prostate cancer. He was initially diagnosed in March 2009 with PSA 3.2 and a Gleason score 4+4 = 8.   He developed advanced disease and subsequently castration resistant disease with bone involvement. His disease volume is a rather low and he is asymptomatic.  He continues to be an extended since August 2018  with excellent response. His PSA declined from 7.7 to 2.0. PSA in January 2019 was 2.6.  The natural course of this disease was discussed today with the patient and his family in detail. He has an incurable malignancy but disease that could be palliated and treated for an extended period of time. His last bone scan was personally reviewed today and discussed with the patient. He has local disease volume and his prognosis is reasonably good.  After discussion today, we will elect to continue with Xtandi and different salvage therapy will be used he develops symptomatic progression of his disease   2. Androgen deprivation therapy: Risks and benefits of continuing this therapy was reviewed today and the rationale behind it. I recommended continuing this under the care of Dr. Diona Fanti indefinitely.  3. Bone directed therapy: His risk of developing skeletal related events is low. Delton See could be a consideration in the future.  4. Follow-up: Will be 6 to 8 weeks follow his progress.  25  minutes was spent with the patient face-to-face today.  More than 50% of time was dedicated to patient counseling, education and coordination of his multifaceted care.       Zola Button, MD 3/5/201912:01 PM

## 2017-10-13 DIAGNOSIS — Z5111 Encounter for antineoplastic chemotherapy: Secondary | ICD-10-CM | POA: Diagnosis not present

## 2017-10-13 DIAGNOSIS — C61 Malignant neoplasm of prostate: Secondary | ICD-10-CM | POA: Diagnosis not present

## 2017-10-13 LAB — PROSTATE-SPECIFIC AG, SERUM (LABCORP): Prostate Specific Ag, Serum: 2.9 ng/mL (ref 0.0–4.0)

## 2017-10-19 ENCOUNTER — Other Ambulatory Visit: Payer: Self-pay | Admitting: Oncology

## 2017-10-19 DIAGNOSIS — C61 Malignant neoplasm of prostate: Secondary | ICD-10-CM

## 2017-10-19 DIAGNOSIS — C7951 Secondary malignant neoplasm of bone: Principal | ICD-10-CM

## 2017-10-26 MED FILL — XTANDI 40 MG CAPSULE: 40 | 30 days supply | Qty: 120 | Fill #0

## 2017-11-21 ENCOUNTER — Other Ambulatory Visit: Payer: Self-pay | Admitting: Student in an Organized Health Care Education/Training Program

## 2017-11-22 ENCOUNTER — Other Ambulatory Visit: Payer: Self-pay | Admitting: Oncology

## 2017-11-22 DIAGNOSIS — C7951 Secondary malignant neoplasm of bone: Principal | ICD-10-CM

## 2017-11-22 DIAGNOSIS — C61 Malignant neoplasm of prostate: Secondary | ICD-10-CM

## 2017-11-24 MED FILL — XTANDI 40 MG CAPSULE: 40 | 30 days supply | Qty: 120 | Fill #0

## 2017-11-30 ENCOUNTER — Telehealth: Payer: Self-pay | Admitting: Oncology

## 2017-11-30 ENCOUNTER — Inpatient Hospital Stay: Payer: Medicare Other

## 2017-11-30 ENCOUNTER — Inpatient Hospital Stay: Payer: Medicare Other | Attending: Oncology | Admitting: Oncology

## 2017-11-30 VITALS — BP 132/115 | HR 55 | Temp 97.7°F | Resp 18 | Ht 70.0 in | Wt 239.5 lb

## 2017-11-30 DIAGNOSIS — E291 Testicular hypofunction: Secondary | ICD-10-CM | POA: Insufficient documentation

## 2017-11-30 DIAGNOSIS — C7951 Secondary malignant neoplasm of bone: Secondary | ICD-10-CM | POA: Diagnosis not present

## 2017-11-30 DIAGNOSIS — C61 Malignant neoplasm of prostate: Secondary | ICD-10-CM | POA: Insufficient documentation

## 2017-11-30 LAB — CBC WITH DIFFERENTIAL (CANCER CENTER ONLY)
BASOS ABS: 0 10*3/uL (ref 0.0–0.1)
BASOS PCT: 0 %
EOS PCT: 2 %
Eosinophils Absolute: 0.1 10*3/uL (ref 0.0–0.5)
HCT: 38.7 % (ref 38.4–49.9)
Hemoglobin: 13.5 g/dL (ref 13.0–17.1)
Lymphocytes Relative: 53 %
Lymphs Abs: 3.6 10*3/uL — ABNORMAL HIGH (ref 0.9–3.3)
MCH: 32.1 pg (ref 27.2–33.4)
MCHC: 34.9 g/dL (ref 32.0–36.0)
MCV: 91.9 fL (ref 79.3–98.0)
Monocytes Absolute: 0.6 10*3/uL (ref 0.1–0.9)
Monocytes Relative: 9 %
NEUTROS ABS: 2.4 10*3/uL (ref 1.5–6.5)
Neutrophils Relative %: 36 %
PLATELETS: 193 10*3/uL (ref 140–400)
RBC: 4.21 MIL/uL (ref 4.20–5.82)
RDW: 13.7 % (ref 11.0–14.6)
WBC: 6.7 10*3/uL (ref 4.0–10.3)

## 2017-11-30 LAB — CMP (CANCER CENTER ONLY)
ALBUMIN: 3.9 g/dL (ref 3.5–5.0)
ALK PHOS: 52 U/L (ref 40–150)
ALT: 31 U/L (ref 0–55)
AST: 29 U/L (ref 5–34)
Anion gap: 11 (ref 3–11)
BUN: 14 mg/dL (ref 7–26)
CHLORIDE: 105 mmol/L (ref 98–109)
CO2: 24 mmol/L (ref 22–29)
CREATININE: 1.09 mg/dL (ref 0.70–1.30)
Calcium: 10.2 mg/dL (ref 8.4–10.4)
GFR, Estimated: >60 mL/min (ref >60)
GLUCOSE: 88 mg/dL (ref 70–140)
Potassium: 3.3 mmol/L — ABNORMAL LOW (ref 3.5–5.1)
SODIUM: 140 mmol/L (ref 136–145)
TOTAL PROTEIN: 7.9 g/dL (ref 6.4–8.3)
Total Bilirubin: 0.5 mg/dL (ref 0.2–1.2)

## 2017-11-30 NOTE — Progress Notes (Signed)
Hematology and Oncology Follow Up Visit  [name] [id number] 1936-05-22 82 y.o. 11/30/2017 10:17 AM [name], MDVincent, [name],*   Principle Diagnosis: 82 year old man with castration-resistant prostate cancer initially diagnosed in 2009.  At that time his PSA was 3.2 and a Gleason score 4+4 = 8.  He developed castration resistant disease to the bone in 2018.    Prior Therapy: He was treated with androgen deprivation as well as definitive radiation therapy. His PSA nadir was 0.5 in 2009.   He developed advanced disease withPSA in February 2015 was up to 36.35 and a fluoride PET scan showed measurable disease in the second rib as well as the 10th thoracic vertebral body. He has been on androgen deprivation since that time. He had an excellent PSA response initially with a nadir down to 0.08 in April 2016.   His PSA was 0.8 in February 2017 and Casodex was added. In April 2017 his PSA was 0.28 and started to rise again in July 2017. His PSA was 0.44 and subsequently was 1.09 in October 2017. On 10/09/2016 his PSA was 3.39. His testosterone level was 30.4. Casodex was discontinued in April 2018. His PSA was 7.7 in August 2018.   Current therapy:   Xtandi 160 mg daily started on 04/08/2017.  Androgen deprivation given Dr. Diona Fanti.  Interim History: [name] returns today for a follow-up.  He reports no major changes since last visit.  He continues to take Xtandi without any recent complications.  He did report some mild nausea when he takes the medication on empty stomach and has been taking it with food which have helped.  He denies any changes in his appetite or performance status.  He denies any bone pain or pathological fractures.  He reports no decline in his energy.  He denies any leg edema.  He does not report any headaches, blurry vision, syncope or seizures. He does not report any fevers, chills or sweats. He does not report any cough, wheezing or  hemoptysis. He is not reporting vomiting or abdominal pain. He does not report any chest pain, palpitation, orthopnea. He does not report any hematuria, dysuria. He does not report any ecchymosis or rash. He does not report lymphadenopathy. He does not report any anxiety or changes in his mood. Remaining review of systems is negative.  Medications: I have reviewed the patient's current medications.  Current Outpatient Medications  Medication Sig Dispense Refill  . amLODipine (NORVASC) 10 MG tablet Take 1 tablet (10 mg total) daily by mouth. 90 tablet 3  . chlorthalidone (HYGROTON) 50 MG tablet Take 1 tablet (50 mg total) daily by mouth. 90 tablet 3  . cyclobenzaprine (FLEXERIL) 5 MG tablet TAKE AS DIRECTED 15 tablet 0  . Hypromellose (ARTIFICIAL TEARS OP) Place 1 drop into the left eye daily.    . ILEVRO 0.3 % ophthalmic suspension INSTILL 1 DROP INTO RIGHT EYE EVERY DAY  2  . losartan (COZAAR) 100 MG tablet Take 1 tablet (100 mg total) by mouth daily. Please note change in tablet dose. 90 tablet 3  . metFORMIN (GLUCOPHAGE-XR) 500 MG 24 hr tablet TAKE 1 TABLET (500 MG TOTAL) BY MOUTH DAILY WITH BREAKFAST. 30 tablet 5  . pravastatin (PRAVACHOL) 20 MG tablet TAKE 1 TABLET (20 MG TOTAL) BY MOUTH EVERY EVENING. 90 tablet 3  . XTANDI 40 MG capsule TAKE 4 CAPSULES (160 MG TOTAL) BY MOUTH DAILY. 120 capsule 0   No current facility-administered medications for this visit.  Allergies:  Allergies  Allergen Reactions  . Aspirin Nausea Only    Patient stated,"I get nauseated with higher doses of Aspirin."    Past Medical History, Surgical history, Social history, and Family History remain unchanged on review today.  Physical Exam: Blood pressure (!) 132/115, pulse (!) 55, temperature 97.7 F (36.5 C), temperature source Oral, resp. rate 18, height 5\' 10"  (1.778 m), weight 239 lb 8 oz (108.6 kg), SpO2 100 %.   ECOG: 1 General appearance: Well-appearing gentleman without distress. Head:  Atraumatic without abnormalities. Oropharynx: Mucous membranes are moist and pink. Eyes: Pupils are equal and round reactive to light. Lymph nodes: No lymphadenopathy noted in the cervical, supraclavicular, and axillary regions. Heart:regular rate and rhythm without murmurs. Lung: Clear in all lung fields without any rhonchi or wheezes. Abdomin: Soft, nontender without any rebound or guarding. Musculoskeletal: No joint deformity or effusion. Neurological: No motor or sensory deficits.   Lab Results: Lab Results  Component Value Date   WBC 7.5 10/12/2017   HGB 13.6 10/12/2017   HCT 39.4 10/12/2017   MCV 90.8 10/12/2017   PLT 211 10/12/2017     Chemistry      Component Value Date/Time   NA 139 10/12/2017 1128   NA 141 08/12/2017 1258   K 3.2 (L) 10/12/2017 1128   K 3.4 (L) 08/12/2017 1258   CL 103 10/12/2017 1128   CO2 26 10/12/2017 1128   CO2 27 08/12/2017 1258   BUN 15 10/12/2017 1128   BUN 14.9 08/12/2017 1258   CREATININE 1.01 10/12/2017 1128   CREATININE 1.0 08/12/2017 1258      Component Value Date/Time   CALCIUM 10.0 10/12/2017 1128   CALCIUM 9.5 08/12/2017 1258   ALKPHOS 53 10/12/2017 1128   ALKPHOS 62 08/12/2017 1258   AST 39 (H) 10/12/2017 1128   AST 36 (H) 08/12/2017 1258   ALT 45 10/12/2017 1128   ALT 43 08/12/2017 1258   BILITOT 0.6 10/12/2017 1128   BILITOT 0.34 08/12/2017 1258     Results for FROILAN, MCLEAN I (MRN [id number]) as of 11/30/2017 10:16  Ref. Range 08/12/2017 12:58 10/12/2017 11:28  Prostate Specific Ag, Serum Latest Ref Range: 0.0 - 4.0 ng/mL 2.6 2.9        Impression and Plan:   82 year old man with the following issues:  1. Castration-resistant advanced prostate cancer with limited disease to the bone.   He is currently on Xtandi with reasonable response to therapy PSA nadir of 2.0 from 7.7.  His PSA is showing slow rise in the interim and currently 2.9 and in March 2019.  Risks and benefits of continuing this medication was  reviewed today and he is agreeable to continue.  We will restage him with a CT scan and a bone scan before the next visit to ensure stability.   2. Androgen deprivation therapy: He continues to receive that under the care of Dr. Diona Fanti.  I recommend continue that indefinitely.  3. Bone directed therapy: His last bone scan obtained in 2018 showed no clear-cut metastatic disease.  We will consider Xgeva in the future more dependent metastasis develops.  4. Follow-up: Will be 8 weeks follow his progress.  15  minutes was spent with the patient face-to-face today.  More than 50% of time was dedicated to patient counseling, education and answering questions regarding future plan of care.      Zola Button, MD 4/23/201910:17 AM

## 2017-11-30 NOTE — Telephone Encounter (Signed)
Scheduled appt per 4/23 los- Gave patient aVS and calender per los. - Central radiology to contact patient with ct scan .

## 2017-12-01 ENCOUNTER — Telehealth: Payer: Self-pay | Admitting: *Deleted

## 2017-12-01 LAB — PROSTATE-SPECIFIC AG, SERUM (LABCORP): Prostate Specific Ag, Serum: 3 ng/mL (ref 0.0–4.0)

## 2017-12-01 NOTE — Telephone Encounter (Signed)
LM for patient to call me.

## 2017-12-01 NOTE — Telephone Encounter (Signed)
-----   Message from Wyatt Portela, MD sent at 12/01/2017  8:01 AM EDT ----- Please let him know his PSA has no changed.

## 2017-12-06 ENCOUNTER — Other Ambulatory Visit: Payer: Self-pay | Admitting: Pharmacist

## 2017-12-16 ENCOUNTER — Other Ambulatory Visit: Payer: Self-pay | Admitting: Student in an Organized Health Care Education/Training Program

## 2017-12-16 ENCOUNTER — Other Ambulatory Visit: Payer: Self-pay | Admitting: Oncology

## 2017-12-16 DIAGNOSIS — C7951 Secondary malignant neoplasm of bone: Principal | ICD-10-CM

## 2017-12-16 DIAGNOSIS — C61 Malignant neoplasm of prostate: Secondary | ICD-10-CM

## 2017-12-16 DIAGNOSIS — E119 Type 2 diabetes mellitus without complications: Secondary | ICD-10-CM

## 2017-12-20 ENCOUNTER — Encounter: Payer: Self-pay | Admitting: Student in an Organized Health Care Education/Training Program

## 2017-12-20 ENCOUNTER — Ambulatory Visit (INDEPENDENT_AMBULATORY_CARE_PROVIDER_SITE_OTHER): Payer: Medicare Other | Admitting: Student in an Organized Health Care Education/Training Program

## 2017-12-20 VITALS — BP 132/69 | HR 66 | Temp 97.5°F | Wt 239.7 lb

## 2017-12-20 DIAGNOSIS — J302 Other seasonal allergic rhinitis: Secondary | ICD-10-CM

## 2017-12-20 DIAGNOSIS — Z7984 Long term (current) use of oral hypoglycemic drugs: Secondary | ICD-10-CM | POA: Diagnosis not present

## 2017-12-20 DIAGNOSIS — Z8669 Personal history of other diseases of the nervous system and sense organs: Secondary | ICD-10-CM | POA: Diagnosis not present

## 2017-12-20 DIAGNOSIS — E119 Type 2 diabetes mellitus without complications: Secondary | ICD-10-CM | POA: Diagnosis not present

## 2017-12-20 DIAGNOSIS — Z8546 Personal history of malignant neoplasm of prostate: Secondary | ICD-10-CM

## 2017-12-20 DIAGNOSIS — M1712 Unilateral primary osteoarthritis, left knee: Secondary | ICD-10-CM

## 2017-12-20 DIAGNOSIS — J309 Allergic rhinitis, unspecified: Secondary | ICD-10-CM | POA: Diagnosis not present

## 2017-12-20 DIAGNOSIS — E0800 Diabetes mellitus due to underlying condition with hyperosmolarity without nonketotic hyperglycemic-hyperosmolar coma (NKHHC): Secondary | ICD-10-CM

## 2017-12-20 DIAGNOSIS — Z7982 Long term (current) use of aspirin: Secondary | ICD-10-CM | POA: Diagnosis not present

## 2017-12-20 DIAGNOSIS — E876 Hypokalemia: Secondary | ICD-10-CM

## 2017-12-20 DIAGNOSIS — Z79899 Other long term (current) drug therapy: Secondary | ICD-10-CM | POA: Diagnosis not present

## 2017-12-20 DIAGNOSIS — Z9114 Patient's other noncompliance with medication regimen: Secondary | ICD-10-CM

## 2017-12-20 DIAGNOSIS — H903 Sensorineural hearing loss, bilateral: Secondary | ICD-10-CM

## 2017-12-20 DIAGNOSIS — I10 Essential (primary) hypertension: Secondary | ICD-10-CM | POA: Diagnosis not present

## 2017-12-20 LAB — GLUCOSE, CAPILLARY: Glucose-Capillary: 131 mg/dL — ABNORMAL HIGH (ref 65–99)

## 2017-12-20 LAB — POCT GLYCOSYLATED HEMOGLOBIN (HGB A1C): Hemoglobin A1C: 5.5

## 2017-12-20 MED ORDER — POTASSIUM CHLORIDE ER 10 MEQ PO TBCR: 10.0000 meq | EXTENDED_RELEASE_TABLET | Freq: Every day | ORAL | 3 refills | Status: DC

## 2017-12-20 MED ORDER — FLUTICASONE PROPIONATE 50 MCG/ACT NA SUSP: 1.0000 | Freq: Every day | NASAL | 2 refills | Status: DC

## 2017-12-20 MED ORDER — ASPIRIN EC 81 MG PO TBEC: 81.0000 mg | DELAYED_RELEASE_TABLET | Freq: Every day | ORAL | 2 refills | Status: AC

## 2017-12-20 NOTE — Assessment & Plan Note (Signed)
Doing very well, A1c 5.5%.  Foot exam today normal.  Plan to continue with metformin XR 500 mg daily.  He has had consultation with ischemic vascular disease, he had a cranial nerve palsy which is now resolved.  Continuing with pravastatin 20 mg daily.  He has been inconsistent with aspirin use because of nausea, I am hopeful that he can continue rather than needing to transition to plavix. Will follow up.

## 2017-12-20 NOTE — Progress Notes (Signed)
   Assessment and Plan:  See Encounters tab for problem-based medical decision making.   __________________________________________________________  HPI:   82 year old man here for follow-up of diabetes and hypertension.  He is doing well at home.  He is retired, spends his days around the house, lives with his wife who accompanied him today on the visit.  He reports being in good spirits and overall doing well.  Reports good compliance with his medications without any side effects.  Reports good nutrition, does not exercise very much because he does get shortness of breath with significant exertion.  He also has left knee pain that gets worse with activity.  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  Experiences no chest pain.  He is complaining of increasing nasal and sinus congestion and a sensation of ear fullness over the last few weeks and months.  Is been inconsistent with sinus irrigation.  No fevers or chills.  No recent falls.  Independent in all activities of daily living.  Follows up with urology for treatment of prostate cancer with androgen deprivation therapy.  __________________________________________________________  Problem List: Patient Active Problem List   Diagnosis Date Noted  . Prostate cancer metastatic to bone ([place]) 04/03/2009    Priority: High  . Essential hypertension 07/09/2006    Priority: High  . Diabetes ([place]) 06/01/2016    Priority: Medium  . Sensorineural hearing loss (SNHL) of both ears 06/28/2017    Priority: Low  . Hyperlipidemia 05/20/2015    Priority: Low  . Obstructive sleep apnea 07/06/2007    Priority: Low  . Allergic rhinitis 07/09/2006    Priority: Low  . Osteoarthritis of left knee 07/09/2006    Priority: Low    Medications: Reconciled today in Epic __________________________________________________________  Physical Exam:  Vital Signs: Vitals:   12/20/17 0946  BP: 132/69  Pulse: 66  Temp: (!) 97.5 F (36.4 C)    TempSrc: Oral  SpO2: 100%  Weight: 239 lb 11.2 oz (108.7 kg)    Gen: Well appearing, NAD ENT: OP clear without erythema or exudate.  Neck: No cervical LAD, No thyromegaly or nodules, No JVD. CV: RRR, no murmurs Pulm: Normal effort, CTA throughout, no wheezing Ext: Warm, no edema, mild crepitus of the left knee with no effusion

## 2017-12-20 NOTE — Assessment & Plan Note (Signed)
Stable, low symptom burden.  I recommended just as needed NSAIDs or Tylenol.  No effusions on exam.  Far from needing surgical evaluation right now.

## 2017-12-20 NOTE — Patient Instructions (Addendum)
1. Use flonase in each nostril once a day.

## 2017-12-20 NOTE — Assessment & Plan Note (Signed)
Mild recurrent allergic rhinitis, more problem over the last few weeks consistent with seasonal onset.  Exam is reassuring.  Plan to continue with sinus irrigation and add intranasal steroids, Flonase once daily.

## 2017-12-20 NOTE — Assessment & Plan Note (Signed)
Blood pressure is stable on current regimen.  Unfortunately I think the thiazide is causing some mild persistent hypokalemia, though asymptomatic.  Plan is to continue with chlorthalidone 50, amlodipine 10, and losartan 100 mg daily.  Going to add a low-dose potassium supplements and will recheck his serum potassium in 3 months.  I hope we can continue with the diet diuretic because it does seem to be effective in managing his hypertension.

## 2017-12-21 ENCOUNTER — Encounter: Payer: Self-pay | Admitting: Student in an Organized Health Care Education/Training Program

## 2017-12-28 MED FILL — XTANDI 40 MG CAPSULE: 40 | 30 days supply | Qty: 120 | Fill #0

## 2018-01-19 ENCOUNTER — Other Ambulatory Visit: Payer: Self-pay | Admitting: Student in an Organized Health Care Education/Training Program

## 2018-01-20 ENCOUNTER — Inpatient Hospital Stay: Payer: Medicare Other | Attending: Oncology

## 2018-01-20 ENCOUNTER — Ambulatory Visit (HOSPITAL_COMMUNITY)
Admission: RE | Admit: 2018-01-20 | Discharge: 2018-01-20 | Disposition: A | Discharge status: Home / Self Care | Payer: Medicare Other | Source: Ambulatory Visit | Attending: Oncology | Admitting: Oncology

## 2018-01-20 ENCOUNTER — Encounter (HOSPITAL_COMMUNITY)
Admission: RE | Admit: 2018-01-20 | Discharge: 2018-01-20 | Disposition: A | Discharge status: Home / Self Care | Payer: Medicare Other | Source: Ambulatory Visit | Attending: Oncology | Admitting: Oncology

## 2018-01-20 DIAGNOSIS — Z79899 Other long term (current) drug therapy: Secondary | ICD-10-CM | POA: Diagnosis not present

## 2018-01-20 DIAGNOSIS — I700 Atherosclerosis of aorta: Secondary | ICD-10-CM | POA: Insufficient documentation

## 2018-01-20 DIAGNOSIS — C61 Malignant neoplasm of prostate: Secondary | ICD-10-CM | POA: Insufficient documentation

## 2018-01-20 DIAGNOSIS — C7951 Secondary malignant neoplasm of bone: Secondary | ICD-10-CM | POA: Insufficient documentation

## 2018-01-20 DIAGNOSIS — R590 Localized enlarged lymph nodes: Secondary | ICD-10-CM | POA: Diagnosis not present

## 2018-01-20 DIAGNOSIS — S79911A Unspecified injury of right hip, initial encounter: Secondary | ICD-10-CM | POA: Diagnosis not present

## 2018-01-20 DIAGNOSIS — M25551 Pain in right hip: Secondary | ICD-10-CM | POA: Diagnosis not present

## 2018-01-20 LAB — CMP (CANCER CENTER ONLY)
ALT: 24 U/L (ref 0–55)
ANION GAP: 11 (ref 3–11)
AST: 25 U/L (ref 5–34)
Albumin: 4.1 g/dL (ref 3.5–5.0)
Alkaline Phosphatase: 58 U/L (ref 40–150)
BUN: 14 mg/dL (ref 7–26)
CALCIUM: 10 mg/dL (ref 8.4–10.4)
CHLORIDE: 102 mmol/L (ref 98–109)
CO2: 26 mmol/L (ref 22–29)
CREATININE: 1.08 mg/dL (ref 0.70–1.30)
Glucose, Bld: 100 mg/dL (ref 70–140)
Potassium: 3.5 mmol/L (ref 3.5–5.1)
SODIUM: 139 mmol/L (ref 136–145)
Total Bilirubin: 0.4 mg/dL (ref 0.2–1.2)
Total Protein: 8.5 g/dL — ABNORMAL HIGH (ref 6.4–8.3)

## 2018-01-20 LAB — CBC WITH DIFFERENTIAL (CANCER CENTER ONLY)
BASOS PCT: 0 %
Basophils Absolute: 0 10*3/uL (ref 0.0–0.1)
EOS ABS: 0.1 10*3/uL (ref 0.0–0.5)
Eosinophils Relative: 2 %
HEMATOCRIT: 39.5 % (ref 38.4–49.9)
Hemoglobin: 13.7 g/dL (ref 13.0–17.1)
Lymphocytes Relative: 56 %
Lymphs Abs: 3.9 10*3/uL — ABNORMAL HIGH (ref 0.9–3.3)
MCH: 31.6 pg (ref 27.2–33.4)
MCHC: 34.7 g/dL (ref 32.0–36.0)
MCV: 91 fL (ref 79.3–98.0)
Monocytes Absolute: 0.6 10*3/uL (ref 0.1–0.9)
Monocytes Relative: 9 %
NEUTROS ABS: 2.3 10*3/uL (ref 1.5–6.5)
Neutrophils Relative %: 33 %
Platelet Count: 215 10*3/uL (ref 140–400)
RBC: 4.34 MIL/uL (ref 4.20–5.82)
RDW: 12.9 % (ref 11.0–14.6)
WBC: 7 10*3/uL (ref 4.0–10.3)

## 2018-01-20 MED ORDER — IOPAMIDOL (ISOVUE-300) INJECTION 61%
100.0000 mL | Freq: Once | INTRAVENOUS | Status: AC | PRN
  Administered 2018-01-20: 100 mL via INTRAVENOUS

## 2018-01-20 MED ORDER — IOPAMIDOL (ISOVUE-300) INJECTION 61%
INTRAVENOUS | Status: AC
  Filled 2018-01-20: qty 100

## 2018-01-20 MED ORDER — TECHNETIUM TC 99M MEDRONATE IV KIT
20.4000 | PACK | Freq: Once | INTRAVENOUS | Status: AC | PRN
  Administered 2018-01-20: 20.4 via INTRAVENOUS

## 2018-01-21 ENCOUNTER — Other Ambulatory Visit: Payer: Medicare Other

## 2018-01-21 ENCOUNTER — Other Ambulatory Visit: Payer: Self-pay

## 2018-01-21 ENCOUNTER — Other Ambulatory Visit: Payer: Self-pay | Admitting: Oncology

## 2018-01-21 DIAGNOSIS — C61 Malignant neoplasm of prostate: Secondary | ICD-10-CM

## 2018-01-21 DIAGNOSIS — C7951 Secondary malignant neoplasm of bone: Principal | ICD-10-CM

## 2018-01-21 LAB — PROSTATE-SPECIFIC AG, SERUM (LABCORP): Prostate Specific Ag, Serum: 3.6 ng/mL (ref 0.0–4.0)

## 2018-01-25 ENCOUNTER — Inpatient Hospital Stay (HOSPITAL_BASED_OUTPATIENT_CLINIC_OR_DEPARTMENT_OTHER): Payer: Medicare Other | Admitting: Oncology

## 2018-01-25 ENCOUNTER — Telehealth: Payer: Self-pay

## 2018-01-25 VITALS — BP 129/73 | HR 70 | Temp 98.1°F | Resp 18 | Ht 70.0 in | Wt 235.4 lb

## 2018-01-25 DIAGNOSIS — E291 Testicular hypofunction: Secondary | ICD-10-CM

## 2018-01-25 DIAGNOSIS — C7951 Secondary malignant neoplasm of bone: Secondary | ICD-10-CM

## 2018-01-25 DIAGNOSIS — C61 Malignant neoplasm of prostate: Secondary | ICD-10-CM | POA: Diagnosis not present

## 2018-01-25 DIAGNOSIS — Z79899 Other long term (current) drug therapy: Secondary | ICD-10-CM | POA: Diagnosis not present

## 2018-01-25 NOTE — Progress Notes (Signed)
Hematology and Oncology Follow Up Visit  [name] [id number] [birth year] 82 y.o. 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  He was diagnosed and 2009 with a PSA was 3.2 and a Gleason score 4+4 = 8.      Prior Therapy: He was treated with androgen deprivation as well as definitive radiation therapy. His PSA nadir was 0.5 in 2009.   He developed advanced disease withPSA in February 2015 was up to 36.35 and a fluoride PET scan showed measurable disease in the second rib as well as the 10th thoracic vertebral body. He has been on androgen deprivation since that time. He had an excellent PSA response initially with a nadir down to 0.08 in April 2016.   His PSA was 0.8 in February 2017 and Casodex was added. In April 2017 his PSA was 0.28 and started to rise again in July 2017. His PSA was 0.44 and subsequently was 1.09 in October 2017. On 10/09/2016 his PSA was 3.39. His testosterone level was 30.4. Casodex was discontinued in April 2018. His PSA was 7.7 in August 2018.   Current therapy:   Xtandi 160 mg daily started on 04/08/2017.   Androgen deprivation given Dr. Diona Fanti.  Interim History: [name] is here for a follow-up visit.  Since last visit, he reports no changes in his health.  He continues to enjoy excellent quality of life without any symptoms related to his cancer.  He denies any bone pain or pathological fractures.  He did report decrease in his weight which some of it is intentional.  He denies any complications related to [place].  He denies any nausea or fatigue or lower extremity edema.  His quality of life remain excellent.   He does not report any headaches, blurry vision, syncope or seizures.  He denies any changes in his mentation or confusion.  He does not report any fevers, chills or sweats. He does not report any cough,  wheezing or hemoptysis. He does not report any chest pain, palpitation, orthopnea.  He denies any nausea, vomiting or early satiety.  He denies any change in his bowel habits.  He does not report any hematuria, dysuria. He does not report any ecchymosis or rash. He does not report easy bruising or lymphadenopathy. He does not report any thrombosis or bleeding tendencies. Remaining review of systems is negative.  Medications: I have reviewed the patient's current medications.  Current Outpatient Medications  Medication Sig Dispense Refill  . amLODipine (NORVASC) 10 MG tablet Take 1 tablet (10 mg total) daily by mouth. 90 tablet 3  . aspirin EC 81 MG tablet Take 1 tablet (81 mg total) by mouth daily. 150 tablet 2  . chlorthalidone (HYGROTON) 50 MG tablet Take 1 tablet (50 mg total) daily by mouth. 90 tablet 3  . fluticasone (FLONASE) 50 MCG/ACT nasal spray Place 1 spray into both nostrils daily. 16 g 2  . Hypromellose (ARTIFICIAL TEARS OP) Place 1 drop into the left eye daily.    . ILEVRO 0.3 % ophthalmic suspension INSTILL 1 DROP INTO RIGHT EYE EVERY DAY  2  . losartan (COZAAR) 100 MG tablet Take 1 tablet (100 mg total) by mouth daily. Please note change in tablet dose. 90 tablet 3  . metFORMIN (GLUCOPHAGE-XR) 500 MG 24 hr tablet TAKE 1 TABLET (500 MG TOTAL) BY MOUTH DAILY WITH BREAKFAST. 30 tablet 5  . potassium chloride (K-DUR)  10 MEQ tablet Take 1 tablet (10 mEq total) by mouth daily. 90 tablet 3  . pravastatin (PRAVACHOL) 20 MG tablet TAKE 1 TABLET (20 MG TOTAL) BY MOUTH EVERY EVENING. 90 tablet 3  . XTANDI 40 MG capsule TAKE 4 CAPSULES (160 MG TOTAL) BY MOUTH DAILY. 120 capsule 2   No current facility-administered medications for this visit.      Allergies:  Allergies  Allergen Reactions  . Aspirin Nausea Only    Patient stated,"I get nauseated with higher doses of Aspirin."    Past Medical History, Surgical history, Social history, and Family History remain unchanged on review  today.  Physical Exam: Blood pressure 129/73, pulse 70, temperature 98.1 F (36.7 C), temperature source Oral, resp. rate 18, height 5\' 10"  (1.778 m), weight 235 lb 6.4 oz (106.8 kg), SpO2 100 %.   ECOG: 1 General appearance: Alert, awake gentleman without distress. Head: Normocephalic without abnormalities. Oropharynx: No oral thrush or ulcers. Eyes: Sclera anicteric.  Conjunctiva is clear.   Lymph nodes: No  cervical, supraclavicular, or axillary lymphadenopathy. Heart: Regular rate and rhythm without any murmurs or gallops. Lung: Clear without any rhonchi, wheezes or dullness to percussion. Abdomin: Soft, any rebound or guarding.  No shifting dullness or ascites. Musculoskeletal: No clubbing or cyanosis.  Full range of motion noted. Neurological: No deficits motor, sensory exam.   Lab Results: Lab Results  Component Value Date   WBC 7.0 01/20/2018   HGB 13.7 01/20/2018   HCT 39.5 01/20/2018   MCV 91.0 01/20/2018   PLT 215 01/20/2018     Chemistry      Component Value Date/Time   NA 139 01/20/2018 0942   NA 141 08/12/2017 1258   K 3.5 01/20/2018 0942   K 3.4 (L) 08/12/2017 1258   CL 102 01/20/2018 0942   CO2 26 01/20/2018 0942   CO2 27 08/12/2017 1258   BUN 14 01/20/2018 0942   BUN 14.9 08/12/2017 1258   CREATININE 1.08 01/20/2018 0942   CREATININE 1.0 08/12/2017 1258      Component Value Date/Time   CALCIUM 10.0 01/20/2018 0942   CALCIUM 9.5 08/12/2017 1258   ALKPHOS 58 01/20/2018 0942   ALKPHOS 62 08/12/2017 1258   AST 25 01/20/2018 0942   AST 36 (H) 08/12/2017 1258   ALT 24 01/20/2018 0942   ALT 43 08/12/2017 1258   BILITOT 0.4 01/20/2018 0942   BILITOT 0.34 08/12/2017 1258       EXAM: CT ABDOMEN AND PELVIS WITH CONTRAST  TECHNIQUE: Multidetector CT imaging of the abdomen and pelvis was performed using the standard protocol following bolus administration of intravenous contrast.  CONTRAST:  182mL ISOVUE-300 IOPAMIDOL (ISOVUE-300) INJECTION  61%  COMPARISON:  CT AP 01/12/2013  FINDINGS: Lower chest: Left lower lobe lung nodule measures 6 mm, image 10/6. Unchanged from comparison exam likely representing a benign abnormality.  Hepatobiliary: The liver appears cirrhotic with a irregular contour and hypertrophy of the caudate lobe. No focal liver abnormality identified. Gallbladder normal. No biliary dilatation.  Pancreas: Normal appearance of the pancreas.  Spleen: Normal in size without focal abnormality.  Adrenals/Urinary Tract: The adrenal glands appear within normal limits.  No kidney mass or hydronephrosis identified. The urinary bladder is normal.  Stomach/Bowel: Stomach is within normal limits. Appendix appears normal. No evidence of bowel wall thickening, distention, or inflammatory changes.  Vascular/Lymphatic: Aortic atherosclerosis. No aneurysm. The previous aortocaval lymph node measures 1 cm, image 41/2. Previously 1.5 cm. Left retroperitoneal lymph node measures 1.2 cm, image 48/2.  Previously 1.0 cm. Pre caval lymph node measures 1.4 cm, image 55/2. Previously 1.2 cm. Right common iliac lymph node measures 0.6 cm, image 62/2. Previously 1.3 cm.  Reproductive: Seed implants within the prostate gland noted.  Other: No ascites or focal fluid collections.  Musculoskeletal: Interval development of multiple small sclerotic lesions within the lumbar spine and bony pelvis compatible with bone metastases. These are new when compared with 01/12/2013. These are also new when compared with PET from 10/09/2013.  IMPRESSION: 1. Interval mixed response to therapy compared with 01/12/2013. 2. Multiple sclerotic foci are identified within the lumbar spine and bony pelvis compatible with metastasis. New from previous exam. 3. Mild abdominal adenopathy is identified. This demonstrates a mixed response compared with previous exam. 4. Morphologic features of the liver suspicious for early  cirrhosis. 5.  Aortic Atherosclerosis (ICD10-I70.0). FINDINGS: No unexpected or suspicious radiotracer accumulation in the visualized bony anatomy to suggest fracture or metastatic involvement. Uptake in the left knee is likely degenerative and is stable since prior. Uptake in the [facility] joints is symmetric and likely degenerative.  IMPRESSION: 1. Stable exam. No findings to suggest metastatic disease or fracture.     Impression and Plan:   82 year old man with:  1. Castration-resistant advanced prostate cancer with disease and adenopathy documented in 2018.  He remains on Xtandi with reasonable response in his PSA that remains under control.  PSA is currently at 3.6 and was a 3.0 in April 2019.  His imaging studies including CT scan and bone scan were reviewed personally and discussed with the patient today.  He appears to have stable disease overall that is asymptomatic.  Risks and benefits of continuing this therapy was discussed today and is agreeable to continue.  Alternative options will be switching to systemic chemotherapy which is unnecessary at this time.   2. Androgen deprivation therapy: I have recommended continuing Lupron indefinitely for the time being.  3. Bone directed therapy: Bone directed therapy would be an option in the future he develops worsening bony metastatic disease.  4. Follow-up: Will be in 2 months to follow his progress.  15  minutes was spent with the patient face-to-face today.  More than 50% of time was dedicated to patient counseling, education and coordinating his future plan of care.      Zola Button, MD 6/18/20193:08 PM

## 2018-01-25 NOTE — Telephone Encounter (Signed)
Priinted avs and calender of upcoming appointment. Per 6/18 los

## 2018-01-26 MED FILL — XTANDI 40 MG CAPSULE: 40 | 30 days supply | Qty: 120 | Fill #0

## 2018-02-04 DIAGNOSIS — Z192 Hormone resistant malignancy status: Secondary | ICD-10-CM | POA: Diagnosis not present

## 2018-02-04 DIAGNOSIS — C778 Secondary and unspecified malignant neoplasm of lymph nodes of multiple regions: Secondary | ICD-10-CM | POA: Diagnosis not present

## 2018-02-04 DIAGNOSIS — R3129 Other microscopic hematuria: Secondary | ICD-10-CM | POA: Diagnosis not present

## 2018-02-04 DIAGNOSIS — C7951 Secondary malignant neoplasm of bone: Secondary | ICD-10-CM | POA: Diagnosis not present

## 2018-02-04 DIAGNOSIS — N3000 Acute cystitis without hematuria: Secondary | ICD-10-CM | POA: Diagnosis not present

## 2018-02-21 MED FILL — XTANDI 40 MG CAPSULE: 40 | 30 days supply | Qty: 120 | Fill #1

## 2018-03-17 ENCOUNTER — Telehealth: Payer: Self-pay | Admitting: Pharmacist

## 2018-03-17 NOTE — Telephone Encounter (Signed)
Oral Oncology Pharmacist Encounter  Received call from patient's wife with questions about reapplying for continued support through the patient advocate foundation for co-pays for Xtandi.  Mrs. Halbig instructed she can call [employer] to reapply for grant on her husband's behalf. If they choose to not reapply on their own behalf for any reason, oral oncology patient advocate will follow up with patient next week for continued copayment support.  [name], PharmD, BCPS, BCOP  03/17/2018 11:14 AM Oral Oncology Clinic [phone]

## 2018-03-20 ENCOUNTER — Other Ambulatory Visit: Payer: Self-pay | Admitting: Student in an Organized Health Care Education/Training Program

## 2018-03-20 DIAGNOSIS — J302 Other seasonal allergic rhinitis: Secondary | ICD-10-CM

## 2018-03-21 MED FILL — XTANDI 40 MG CAPSULE: 40 | 30 days supply | Qty: 120 | Fill #2

## 2018-03-22 ENCOUNTER — Telehealth: Payer: Self-pay

## 2018-03-22 NOTE — Telephone Encounter (Signed)
Oral Oncology Patient Advocate Encounter  Oral Oncology Patient Advocate Encounter  Received notification from [place] that the existing prior authorization for [name] is due for renewal.  Renewal PA submitted on CoverMyMeds Key [id number] Status is pending  Oral Oncology Clinic will continue to follow.   [place] Patient [place] Phone [phone] Fax [phone]

## 2018-03-22 NOTE — Telephone Encounter (Signed)
Oral Oncology Patient Advocate Encounter  Prior Authorization for [name] has been approved.    PA# [id number] Effective dates: 03/22/18 through 08/09/18  Oral Oncology Clinic will continue to follow.   [place] Patient [place] Phone [phone] Fax [phone]

## 2018-03-23 ENCOUNTER — Other Ambulatory Visit: Payer: Self-pay | Admitting: Student in an Organized Health Care Education/Training Program

## 2018-03-23 DIAGNOSIS — I10 Essential (primary) hypertension: Secondary | ICD-10-CM

## 2018-03-25 NOTE — Telephone Encounter (Signed)
Oral Oncology Patient Advocate Encounter  I called [name] and left a message. [name] called back and I assured her that I have it wrote in my calendar to renew the PAN grant on 05/10/18.   She verbalized understanding and appreciation.   [place] Patient [place] Phone [phone] Fax [phone]

## 2018-03-29 ENCOUNTER — Inpatient Hospital Stay: Payer: Medicare Other | Attending: Oncology

## 2018-03-29 DIAGNOSIS — E291 Testicular hypofunction: Secondary | ICD-10-CM | POA: Diagnosis not present

## 2018-03-29 DIAGNOSIS — C61 Malignant neoplasm of prostate: Secondary | ICD-10-CM | POA: Diagnosis present

## 2018-03-29 DIAGNOSIS — C7951 Secondary malignant neoplasm of bone: Secondary | ICD-10-CM | POA: Diagnosis not present

## 2018-03-29 LAB — CBC WITH DIFFERENTIAL (CANCER CENTER ONLY)
BASOS ABS: 0 10*3/uL (ref 0.0–0.1)
Basophils Relative: 1 %
EOS ABS: 0.1 10*3/uL (ref 0.0–0.5)
EOS PCT: 2 %
HCT: 40.5 % (ref 38.4–49.9)
Hemoglobin: 13.9 g/dL (ref 13.0–17.1)
LYMPHS PCT: 52 %
Lymphs Abs: 2.8 10*3/uL (ref 0.9–3.3)
MCH: 31.7 pg (ref 27.2–33.4)
MCHC: 34.4 g/dL (ref 32.0–36.0)
MCV: 92.1 fL (ref 79.3–98.0)
Monocytes Absolute: 0.5 10*3/uL (ref 0.1–0.9)
Monocytes Relative: 10 %
Neutro Abs: 1.9 10*3/uL (ref 1.5–6.5)
Neutrophils Relative %: 35 %
PLATELETS: 190 10*3/uL (ref 140–400)
RBC: 4.4 MIL/uL (ref 4.20–5.82)
RDW: 13.3 % (ref 11.0–14.6)
WBC: 5.3 10*3/uL (ref 4.0–10.3)

## 2018-03-29 LAB — CMP (CANCER CENTER ONLY)
ALT: 26 U/L (ref 0–44)
AST: 25 U/L (ref 15–41)
Albumin: 4 g/dL (ref 3.5–5.0)
Alkaline Phosphatase: 51 U/L (ref 38–126)
Anion gap: 11 (ref 5–15)
BUN: 15 mg/dL (ref 8–23)
CHLORIDE: 106 mmol/L (ref 98–111)
CO2: 25 mmol/L (ref 22–32)
CREATININE: 1.21 mg/dL (ref 0.61–1.24)
Calcium: 9.8 mg/dL (ref 8.9–10.3)
GFR, Est AFR Am: >60 mL/min (ref >60)
GFR, Estimated: 55 mL/min — ABNORMAL LOW (ref >60)
Glucose, Bld: 116 mg/dL — ABNORMAL HIGH (ref 70–99)
Potassium: 3.5 mmol/L (ref 3.5–5.1)
SODIUM: 142 mmol/L (ref 135–145)
Total Bilirubin: 0.6 mg/dL (ref 0.3–1.2)
Total Protein: 8.1 g/dL (ref 6.5–8.1)

## 2018-03-30 LAB — PROSTATE-SPECIFIC AG, SERUM (LABCORP): PROSTATE SPECIFIC AG, SERUM: 3.9 ng/mL (ref 0.0–4.0)

## 2018-03-31 ENCOUNTER — Telehealth: Payer: Self-pay | Admitting: Oncology

## 2018-03-31 ENCOUNTER — Inpatient Hospital Stay (HOSPITAL_BASED_OUTPATIENT_CLINIC_OR_DEPARTMENT_OTHER): Payer: Medicare Other | Admitting: Oncology

## 2018-03-31 VITALS — BP 104/79 | HR 71 | Temp 98.3°F | Resp 18 | Ht 70.0 in | Wt 233.5 lb

## 2018-03-31 DIAGNOSIS — C7951 Secondary malignant neoplasm of bone: Secondary | ICD-10-CM

## 2018-03-31 DIAGNOSIS — E291 Testicular hypofunction: Secondary | ICD-10-CM

## 2018-03-31 DIAGNOSIS — C61 Malignant neoplasm of prostate: Secondary | ICD-10-CM

## 2018-03-31 NOTE — Progress Notes (Signed)
Hematology and Oncology Follow Up Visit  [name] [id number] Mar 06, 1936 82 y.o. 03/31/2018 3:30 PM [name], MDVincent, [name],*   Principle Diagnosis: 82 year old man with castration-resistant prostate cancer initially diagnosed in 2009 with PSA was 3.2 and a Gleason score 4+4 = 8.    He developed castration resistant disease in August 2018.  Prior Therapy: He was treated with androgen deprivation as well as definitive radiation therapy. His PSA nadir was 0.5 in 2009.   He developed advanced disease withPSA in February 2015 was up to 36.35 and a fluoride PET scan showed measurable disease in the second rib as well as the 10th thoracic vertebral body. He has been on androgen deprivation since that time. He had an excellent PSA response initially with a nadir down to 0.08 in April 2016.   His PSA was 0.8 in February 2017 and Casodex was added. In April 2017 his PSA was 0.28 and started to rise again in July 2017. His PSA was 0.44 and subsequently was 1.09 in October 2017. On 10/09/2016 his PSA was 3.39. His testosterone level was 30.4. Casodex was discontinued in April 2018. His PSA was 7.7 in August 2018.   Current therapy:   Xtandi 160 mg daily started on 04/08/2017.   Androgen deprivation given Dr. Diona Fanti.  Interim History: Mr. Zelek presents today for a follow-up.  Since last visit, he reports no major changes in his health.  He continues to tolerate Xtandi without any major complications.  He does report some lightheadedness at times associated with this medication but without any loss of balance or falls.  He still able to drive and attends to activities of daily living.  He denies any bone pain or pathological fractures.  He urinates reasonably well although he does catheterizes himself about once a week.  He does report some mild constipation and takes stool softeners at times.  He does not report any headaches, blurry vision, syncope or seizures.  He  denies any dizziness or excessive fatigue.  He does not report any fevers, chills or sweats. He does not report any cough, wheezing or hemoptysis. He does not report any chest pain, palpitation, orthopnea.  He denies any nausea, vomiting or abdominal distention.  He denies any constipation or diarrhea.  He does not report any hematuria, dysuria. He does not report any skin rashes or lesions.  He does not report easy bleeding or clotting tendencies.  He denies any change in his mood. Remaining review of systems is negative.  Medications: I have reviewed the patient's current medications.  Current Outpatient Medications  Medication Sig Dispense Refill  . amLODipine (NORVASC) 10 MG tablet Take 1 tablet (10 mg total) daily by mouth. 90 tablet 3  . aspirin EC 81 MG tablet Take 1 tablet (81 mg total) by mouth daily. 150 tablet 2  . chlorthalidone (HYGROTON) 50 MG tablet Take 1 tablet (50 mg total) daily by mouth. 90 tablet 3  . fluticasone (FLONASE) 50 MCG/ACT nasal spray PLACE 1 SPRAY INTO BOTH NOSTRILS DAILY. 16 g 5  . Hypromellose (ARTIFICIAL TEARS OP) Place 1 drop into the left eye daily.    . ILEVRO 0.3 % ophthalmic suspension INSTILL 1 DROP INTO RIGHT EYE EVERY DAY  2  . losartan (COZAAR) 100 MG tablet Take 1 tablet (100 mg total) by mouth daily. Please note change in tablet dose. 90 tablet 3  . losartan (COZAAR) 100 MG tablet TAKE 1 TABLET (100 MG TOTAL) BY MOUTH DAILY. PLEASE NOTE CHANGE  IN TABLET DOSE. 90 tablet 3  . metFORMIN (GLUCOPHAGE-XR) 500 MG 24 hr tablet TAKE 1 TABLET (500 MG TOTAL) BY MOUTH DAILY WITH BREAKFAST. 30 tablet 5  . potassium chloride (K-DUR) 10 MEQ tablet Take 1 tablet (10 mEq total) by mouth daily. 90 tablet 3  . pravastatin (PRAVACHOL) 20 MG tablet TAKE 1 TABLET (20 MG TOTAL) BY MOUTH EVERY EVENING. 90 tablet 3  . XTANDI 40 MG capsule TAKE 4 CAPSULES (160 MG TOTAL) BY MOUTH DAILY. 120 capsule 2   No current facility-administered medications for this visit.       Allergies:  Allergies  Allergen Reactions  . Aspirin Nausea Only    Patient stated,"I get nauseated with higher doses of Aspirin."    Past Medical History, Surgical history, Social history, and Family History remain unchanged on review today.  Physical Exam:  Blood pressure 104/79, pulse 71, temperature 98.3 F (36.8 C), temperature source Oral, resp. rate 18, height 5\' 10"  (1.778 m), weight 233 lb 8 oz (105.9 kg), SpO2 100 %.   ECOG: 1 General appearance: Comfortable appearing gentleman without distress. Head: Atraumatic without abnormalities. Oropharynx: Mucous membranes are moist and pink. Eyes: Pupils are equal and round reactive to light. Lymph nodes: No lymphadenopathy noted in the cervical, supraclavicular, or axillary lymph nodes. Heart: Regular rate and rhythm.  S1 and S2 without any murmurs. Lung: Clear without any rhonchi, wheezes or dullness to percussion. Abdomin: Soft,, nontender without any rebound or guarding.  Good bowel sounds. Musculoskeletal: No clubbing or cyanosis. Neurological: Intact motor, sensory exam.   Lab Results: Lab Results  Component Value Date   WBC 5.3 03/29/2018   HGB 13.9 03/29/2018   HCT 40.5 03/29/2018   MCV 92.1 03/29/2018   PLT 190 03/29/2018     Chemistry      Component Value Date/Time   NA 142 03/29/2018 0803   NA 141 08/12/2017 1258   K 3.5 03/29/2018 0803   K 3.4 (L) 08/12/2017 1258   CL 106 03/29/2018 0803   CO2 25 03/29/2018 0803   CO2 27 08/12/2017 1258   BUN 15 03/29/2018 0803   BUN 14.9 08/12/2017 1258   CREATININE 1.21 03/29/2018 0803   CREATININE 1.0 08/12/2017 1258      Component Value Date/Time   CALCIUM 9.8 03/29/2018 0803   CALCIUM 9.5 08/12/2017 1258   ALKPHOS 51 03/29/2018 0803   ALKPHOS 62 08/12/2017 1258   AST 25 03/29/2018 0803   AST 36 (H) 08/12/2017 1258   ALT 26 03/29/2018 0803   ALT 43 08/12/2017 1258   BILITOT 0.6 03/29/2018 0803   BILITOT 0.34 08/12/2017 1258         Ref.  Range 01/20/2018 09:42 03/29/2018 08:03  Prostate Specific Ag, Serum Latest Ref Range: 0.0 - 4.0 ng/mL 3.6 3.9     Impression and Plan:   82 year old man with:  1. 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  He is currently on Xtandi and has tolerated therapy reasonably well over the last year.  His PSA did show slight increase up to 3.9 with a doubling time of over than 6 months.  His imaging studies obtained in June 2019 were reviewed again I did not show clear progression of disease.  The natural course of this disease was reviewed again with the patient and his wife.  The rise in the PSA was also explained and future treatment options were reviewed.  These options would include different hormonal agents versus systemic  chemotherapy.  The rise in the PSA has been rather very slow and I recommended continuing Xtandi for the time being.  The rationale for using dose reduction because of lightheadedness was reviewed today.  He is tolerating the current dose without any major complications and will continue to address this issue with him moving forward.     2. Androgen deprivation therapy: He receives Lupron under the care of Dr. Diona Fanti without any complications at this time.  I recommended continuing this for the time being.  3. Bone directed therapy: I recommended calcium and vitamin D supplements and consideration for Xgeva in the future.  4.  Prognosis and goals of care: This was reviewed today and he understands he has an incurable malignancy.  Any treatment for his disease is palliative in nature to keep his quality of life as best as possible.  5. Follow-up: Will be in 2 months.   25  minutes was spent with the patient face-to-face today.  More than 50% of time was dedicated to discussing the natural course of this disease, reviewing imaging studies as well as discussing future treatment plans.      Zola Button,  MD 8/22/20193:30 PM

## 2018-03-31 NOTE — Telephone Encounter (Signed)
Appts scheduled AVS/Calendar printed per 8/22 loso

## 2018-04-12 ENCOUNTER — Other Ambulatory Visit: Payer: Self-pay | Admitting: Oncology

## 2018-04-12 DIAGNOSIS — C7951 Secondary malignant neoplasm of bone: Principal | ICD-10-CM

## 2018-04-12 DIAGNOSIS — C61 Malignant neoplasm of prostate: Secondary | ICD-10-CM

## 2018-04-13 ENCOUNTER — Encounter: Payer: Self-pay | Admitting: Pharmacist

## 2018-04-14 MED FILL — XTANDI 40 MG CAPSULE: 40 | 30 days supply | Qty: 120 | Fill #0

## 2018-04-15 DIAGNOSIS — N35012 Post-traumatic membranous urethral stricture: Secondary | ICD-10-CM | POA: Diagnosis not present

## 2018-04-15 DIAGNOSIS — N3000 Acute cystitis without hematuria: Secondary | ICD-10-CM | POA: Diagnosis not present

## 2018-05-10 ENCOUNTER — Telehealth: Payer: Self-pay

## 2018-05-10 NOTE — Telephone Encounter (Signed)
Oral Oncology Patient Advocate Encounter  I was able to secure a new Patient [facility]) to cover the copay for Xtandi. The grant information is as follows and has been shared with [place].   BIN: [id number] ID: [id number] PCN: [id number] Group: [id number]  I called the patient and was not able to leave a voicemail on any number. This will be automatically billed on his next fill scheduled for 05/16/18  [place] Patient [place] Phone [phone] Fax [phone]

## 2018-05-16 MED FILL — XTANDI 40 MG CAPSULE: 40 | 30 days supply | Qty: 120 | Fill #1

## 2018-05-31 ENCOUNTER — Inpatient Hospital Stay: Payer: Medicare Other | Attending: Oncology

## 2018-05-31 DIAGNOSIS — E291 Testicular hypofunction: Secondary | ICD-10-CM | POA: Diagnosis not present

## 2018-05-31 DIAGNOSIS — C61 Malignant neoplasm of prostate: Secondary | ICD-10-CM

## 2018-05-31 DIAGNOSIS — Z79899 Other long term (current) drug therapy: Secondary | ICD-10-CM | POA: Insufficient documentation

## 2018-05-31 DIAGNOSIS — C7951 Secondary malignant neoplasm of bone: Secondary | ICD-10-CM | POA: Insufficient documentation

## 2018-05-31 LAB — CBC WITH DIFFERENTIAL (CANCER CENTER ONLY)
ABS IMMATURE GRANULOCYTES: 0.01 10*3/uL (ref 0.00–0.07)
BASOS ABS: 0 10*3/uL (ref 0.0–0.1)
Basophils Relative: 1 %
Eosinophils Absolute: 0.1 10*3/uL (ref 0.0–0.5)
Eosinophils Relative: 3 %
HEMATOCRIT: 40.8 % (ref 39.0–52.0)
Hemoglobin: 14 g/dL (ref 13.0–17.0)
IMMATURE GRANULOCYTES: 0 %
LYMPHS PCT: 57 %
Lymphs Abs: 3.1 10*3/uL (ref 0.7–4.0)
MCH: 31.5 pg (ref 26.0–34.0)
MCHC: 34.3 g/dL (ref 30.0–36.0)
MCV: 91.9 fL (ref 80.0–100.0)
MONOS PCT: 11 %
Monocytes Absolute: 0.6 10*3/uL (ref 0.1–1.0)
NEUTROS ABS: 1.5 10*3/uL — AB (ref 1.7–7.7)
NEUTROS PCT: 28 %
NRBC: 0 % (ref 0.0–0.2)
Platelet Count: 198 10*3/uL (ref 150–400)
RBC: 4.44 MIL/uL (ref 4.22–5.81)
RDW: 12.9 % (ref 11.5–15.5)
WBC Count: 5.3 10*3/uL (ref 4.0–10.5)

## 2018-05-31 LAB — CMP (CANCER CENTER ONLY)
ALBUMIN: 4 g/dL (ref 3.5–5.0)
ALT: 26 U/L (ref 0–44)
AST: 22 U/L (ref 15–41)
Alkaline Phosphatase: 52 U/L (ref 38–126)
Anion gap: 12 (ref 5–15)
BUN: 12 mg/dL (ref 8–23)
CHLORIDE: 105 mmol/L (ref 98–111)
CO2: 26 mmol/L (ref 22–32)
Calcium: 9.9 mg/dL (ref 8.9–10.3)
Creatinine: 1.1 mg/dL (ref 0.61–1.24)
GFR, Estimated: >60 mL/min (ref >60)
Glucose, Bld: 98 mg/dL (ref 70–99)
POTASSIUM: 3.4 mmol/L — AB (ref 3.5–5.1)
SODIUM: 143 mmol/L (ref 135–145)
Total Bilirubin: 0.5 mg/dL (ref 0.3–1.2)
Total Protein: 8.2 g/dL — ABNORMAL HIGH (ref 6.5–8.1)

## 2018-06-01 LAB — PROSTATE-SPECIFIC AG, SERUM (LABCORP): PROSTATE SPECIFIC AG, SERUM: 5.2 ng/mL — AB (ref 0.0–4.0)

## 2018-06-02 ENCOUNTER — Telehealth: Payer: Self-pay

## 2018-06-02 ENCOUNTER — Inpatient Hospital Stay (HOSPITAL_BASED_OUTPATIENT_CLINIC_OR_DEPARTMENT_OTHER): Payer: Medicare Other | Admitting: Oncology

## 2018-06-02 VITALS — BP 127/78 | HR 70 | Temp 97.9°F | Resp 17 | Ht 70.0 in | Wt 237.6 lb

## 2018-06-02 DIAGNOSIS — C61 Malignant neoplasm of prostate: Secondary | ICD-10-CM

## 2018-06-02 DIAGNOSIS — C7951 Secondary malignant neoplasm of bone: Secondary | ICD-10-CM | POA: Diagnosis not present

## 2018-06-02 DIAGNOSIS — E291 Testicular hypofunction: Secondary | ICD-10-CM | POA: Diagnosis not present

## 2018-06-02 DIAGNOSIS — Z79899 Other long term (current) drug therapy: Secondary | ICD-10-CM | POA: Diagnosis not present

## 2018-06-02 NOTE — Telephone Encounter (Signed)
Printed avs and calender of upcoming appointment. Per 10/24 los 

## 2018-06-02 NOTE — Progress Notes (Signed)
Hematology and Oncology Follow Up Visit  [name] [id number] 27-Nov-1935 82 y.o. 06/02/2018 3:38 PM [name], MDVincent, [name],*   Principle Diagnosis: 82 year old man with prostate cancer diagnosed in 2009 with PSA was 3.2 and a Gleason score 4+4 = 8.  He developed castration resistant disease with documented bone disease and lymphadenopathy in August 2018.  Prior Therapy: He was treated with androgen deprivation as well as definitive radiation therapy. His PSA nadir was 0.5 in 2009.   He developed advanced disease with PSA in February 2015 was up to 36.35 and a fluoride PET scan showed measurable disease in the second rib as well as the 10th thoracic vertebral body. He was started on androgen deprivation since that time. He had an excellent PSA response initially with a nadir down to 0.08 in April 2016.   His PSA was 0.8 in February 2017 and Casodex was added.   In April 2017 his PSA was 0.28 and started to rise again in July 2017. His PSA was 0.44 and subsequently was 1.09 in October 2017. On 10/09/2016 his PSA was 3.39. His testosterone level was 30.4. Casodex was discontinued in April 2018. His PSA was 7.7 in August 2018.   Current therapy:   Xtandi 160 mg daily started on 04/08/2017.   Androgen deprivation given Dr. Diona Fanti.  Interim History: [name] returns today for repeat evaluation.  Since her last visit, he reports no major changes in his health.  He remains active and attends to activities of daily living.  He continues to tolerate Xtandi without any recent complaints.  He denies any excessive fatigue, tiredness or bone pain.  His appetite and his quality of life remains unchanged.  He denies any urination difficulties.  He does not report any headaches, blurry vision, syncope or seizures.  He denies any dizziness or excessive fatigue.  He does not report any fevers, chills or sweats. He does not report any cough, wheezing or hemoptysis. He does  not report any chest pain, palpitation, orthopnea.  He denies any nausea, vomiting or abdominal distention.  He denies any constipation or diarrhea.  He does not report any hematuria, dysuria. He does not report any skin rashes or lesions.  He does not report easy bleeding or clotting tendencies.  He denies any change in his mood. Remaining review of systems is negative.  Medications: [name] have reviewed the patient's current medications.  Current Outpatient Medications  Medication Sig Dispense Refill  . amLODipine (NORVASC) 10 MG tablet Take 1 tablet (10 mg total) daily by mouth. 90 tablet 3  . aspirin EC 81 MG tablet Take 1 tablet (81 mg total) by mouth daily. 150 tablet 2  . chlorthalidone (HYGROTON) 50 MG tablet Take 1 tablet (50 mg total) daily by mouth. 90 tablet 3  . fluticasone (FLONASE) 50 MCG/ACT nasal spray PLACE 1 SPRAY INTO BOTH NOSTRILS DAILY. 16 g 5  . Hypromellose (ARTIFICIAL TEARS OP) Place 1 drop into the left eye daily.    . ILEVRO 0.3 % ophthalmic suspension INSTILL 1 DROP INTO RIGHT EYE EVERY DAY  2  . losartan (COZAAR) 100 MG tablet Take 1 tablet (100 mg total) by mouth daily. Please note change in tablet dose. 90 tablet 3  . losartan (COZAAR) 100 MG tablet TAKE 1 TABLET (100 MG TOTAL) BY MOUTH DAILY. PLEASE NOTE CHANGE IN TABLET DOSE. 90 tablet 3  . metFORMIN (GLUCOPHAGE-XR) 500 MG 24 hr tablet TAKE 1 TABLET (500 MG TOTAL) BY MOUTH DAILY WITH BREAKFAST.  30 tablet 5  . potassium chloride (K-DUR) 10 MEQ tablet Take 1 tablet (10 mEq total) by mouth daily. 90 tablet 3  . pravastatin (PRAVACHOL) 20 MG tablet TAKE 1 TABLET (20 MG TOTAL) BY MOUTH EVERY EVENING. 90 tablet 3  . XTANDI 40 MG capsule TAKE 4 CAPSULES (160 MG TOTAL) BY MOUTH DAILY. 120 capsule 2   No current facility-administered medications for this visit.      Allergies:  Allergies  Allergen Reactions  . Aspirin Nausea Only    Patient stated,"[name] get nauseated with higher doses of Aspirin."    Past Medical History,  Surgical history, Social history, and Family History remain unchanged on review today.  Physical Exam:  Blood pressure 127/78, pulse 70, temperature 97.9 F (36.6 C), temperature source Oral, resp. rate 17, height 5\' 10"  (1.778 m), weight 237 lb 9.6 oz (107.8 kg), SpO2 100 %.    ECOG: 1 General appearance: Comfortable appearing gentleman without distress. Head: Atraumatic without abnormalities. Oropharynx: Mucous membranes are moist and pink. Eyes: Pupils are equal and round reactive to light. Lymph nodes: No lymphadenopathy noted in the cervical, supraclavicular, or axillary lymph nodes. Heart: Regular rate and rhythm.  S1 and S2 without any murmurs. Lung: Clear without any rhonchi, wheezes or dullness to percussion. Abdomin: Soft,, nontender without any rebound or guarding.  Good bowel sounds. Musculoskeletal: No clubbing or cyanosis. Neurological: Intact motor, sensory exam.   Lab Results: Lab Results  Component Value Date   WBC 5.3 05/31/2018   HGB 14.0 05/31/2018   HCT 40.8 05/31/2018   MCV 91.9 05/31/2018   PLT 198 05/31/2018     Chemistry      Component Value Date/Time   NA 143 05/31/2018 0953   NA 141 08/12/2017 1258   K 3.4 (L) 05/31/2018 0953   K 3.4 (L) 08/12/2017 1258   CL 105 05/31/2018 0953   CO2 26 05/31/2018 0953   CO2 27 08/12/2017 1258   BUN 12 05/31/2018 0953   BUN 14.9 08/12/2017 1258   CREATININE 1.10 05/31/2018 0953   CREATININE 1.0 08/12/2017 1258      Component Value Date/Time   CALCIUM 9.9 05/31/2018 0953   CALCIUM 9.5 08/12/2017 1258   ALKPHOS 52 05/31/2018 0953   ALKPHOS 62 08/12/2017 1258   AST 22 05/31/2018 0953   AST 36 (H) 08/12/2017 1258   ALT 26 05/31/2018 0953   ALT 43 08/12/2017 1258   BILITOT 0.5 05/31/2018 0953   BILITOT 0.34 08/12/2017 1258      Results for [name] (MRN [id number]) as of 06/02/2018 14:01  Ref. Range 03/29/2018 08:03 05/31/2018 09:53  Prostate Specific Ag, Serum Latest Ref Range: 0.0 - 4.0  ng/mL 3.9 5.2 (H)       Impression and Plan:   82 year old man with:  1. Castration-resistant prostate cancer with limited bone and adenopathy documented in 2018.  He continues to be on Xtandi without any major complications.  He is completely asymptomatic from his disease and has not reported any side effects related to [place].  His PSA showed recent increase of currently up to 5.2.  Imaging studies from June 2019 were reviewed again and discussed with the patient.  He has very limited measurable disease at this time.  The natural course of his disease as well as future treatment options were reviewed today.  Given the small rise in his PSA and very limited metastasis at this time, [name] have elected to continue with the same treatment.  Alternative options include  Virgel Bouquet and systemic chemotherapy.  These options will be deferred for the time being.    2. Androgen deprivation therapy: [name] recommended long-term androgen deprivation therapy which she is currently receiving under the care of Dr. Diona Fanti.  3. Bone directed therapy: He has very limited bone metastasis at this time.  Delton See will be considered if he develops worsening bony disease.  Recommended calcium and vitamin D.  4.  Prognosis and goals of care: This was reiterated again today.  His disease is incurable but his progression has been rather slow with very little measurable disease.  Aggressive therapy is warranted at this time with excellent performance status.  5. Follow-up: Will be in 2 months.   25  minutes was spent with the patient face-to-face today.  More than 50% of time was dedicated to reviewing his disease status, treatment options and coordinating plan of care.      Zola Button, MD 10/24/20193:38 PM

## 2018-06-06 ENCOUNTER — Encounter: Payer: Self-pay | Admitting: Student in an Organized Health Care Education/Training Program

## 2018-06-06 ENCOUNTER — Ambulatory Visit (INDEPENDENT_AMBULATORY_CARE_PROVIDER_SITE_OTHER): Payer: Medicare Other | Admitting: Student in an Organized Health Care Education/Training Program

## 2018-06-06 ENCOUNTER — Ambulatory Visit (HOSPITAL_COMMUNITY)
Admission: RE | Admit: 2018-06-06 | Discharge: 2018-06-06 | Disposition: A | Discharge status: Home / Self Care | Payer: Medicare Other | Source: Ambulatory Visit | Attending: Family Medicine | Admitting: Family Medicine

## 2018-06-06 VITALS — BP 145/82 | HR 58 | Temp 97.9°F | Wt 235.9 lb

## 2018-06-06 DIAGNOSIS — G4733 Obstructive sleep apnea (adult) (pediatric): Secondary | ICD-10-CM

## 2018-06-06 DIAGNOSIS — I447 Left bundle-branch block, unspecified: Secondary | ICD-10-CM | POA: Diagnosis not present

## 2018-06-06 DIAGNOSIS — E1169 Type 2 diabetes mellitus with other specified complication: Secondary | ICD-10-CM

## 2018-06-06 DIAGNOSIS — Z23 Encounter for immunization: Secondary | ICD-10-CM | POA: Diagnosis not present

## 2018-06-06 DIAGNOSIS — R079 Chest pain, unspecified: Secondary | ICD-10-CM | POA: Diagnosis not present

## 2018-06-06 DIAGNOSIS — R0789 Other chest pain: Secondary | ICD-10-CM | POA: Diagnosis not present

## 2018-06-06 DIAGNOSIS — I10 Essential (primary) hypertension: Secondary | ICD-10-CM

## 2018-06-06 DIAGNOSIS — Z79899 Other long term (current) drug therapy: Secondary | ICD-10-CM

## 2018-06-06 DIAGNOSIS — Z7982 Long term (current) use of aspirin: Secondary | ICD-10-CM

## 2018-06-06 DIAGNOSIS — Z7984 Long term (current) use of oral hypoglycemic drugs: Secondary | ICD-10-CM

## 2018-06-06 LAB — POCT GLYCOSYLATED HEMOGLOBIN (HGB A1C): Hemoglobin A1C: 5.9 % — AB (ref 4.0–5.6)

## 2018-06-06 LAB — GLUCOSE, CAPILLARY: Glucose-Capillary: 104 mg/dL — ABNORMAL HIGH (ref 70–99)

## 2018-06-06 NOTE — Assessment & Plan Note (Signed)
Patient with severe sleep apnea, he has decided not to pursue CPAP treatment.  Feels it would worsen his sleep at night more.  Struggling with a lot of nocturia related to his prostate disease.

## 2018-06-06 NOTE — Progress Notes (Signed)
   Assessment and Plan:  See Encounters tab for problem-based medical decision making.   __________________________________________________________  HPI:   82 year old man here for follow-up of diabetes and hypertension.  He reports that he is doing well, is now fully retired.  He lives with his wife.  Reports compliance with his medications.  Reports one potential side effect of persistent dry upper airway cough.  He is tried sinus steroids, and says that has not helped much.  Not having any other allergic symptoms right now.  The cough is only mild in severity, does not really bother him much, his wife does not notice it.  No fevers or chills.    He has another acute complaint of a moderate left-sided chest pain.  Has been present for about 1 month.  Bothers him most in the evening and when he sleeps.  Wonders if he sleeping on it wrong.  Denies pain in his shoulder.  The pain is not worsened by exertion.  He does not really exercise much, he does some chores around the house and around the yard.  Denies any angina at that time.  No dyspnea with exertion, though he is feeling more tired and worn out by simple activities.  __________________________________________________________  Problem List: Patient Active Problem List   Diagnosis Date Noted  . Prostate cancer metastatic to bone ([place]) 04/03/2009    Priority: High  . Essential hypertension 07/09/2006    Priority: High  . Type 2 diabetes mellitus with other specified complication ([place]) 77/06/6578    Priority: Medium  . Sensorineural hearing loss (SNHL) of both ears 06/28/2017    Priority: Low  . Hyperlipidemia 05/20/2015    Priority: Low  . Obstructive sleep apnea 07/06/2007    Priority: Low  . Allergic rhinitis 07/09/2006    Priority: Low  . Osteoarthritis of left knee 07/09/2006    Priority: Low  . Chest pain 06/06/2018    Medications: Reconciled today in  Epic __________________________________________________________  Physical Exam:  Vital Signs: Vitals:   06/06/18 1040  BP: (!) 145/82  Pulse: (!) 58  Temp: 97.9 F (36.6 C)  TempSrc: Oral  SpO2: 100%  Weight: 235 lb 14.4 oz (107 kg)    Gen: Well appearing, NAD Neck: No cervical LAD, No thyromegaly or nodules, No JVD. CV: RRR, no murmurs Pulm: Normal effort, CTA throughout, no wheezing Ext: Warm, no edema, normal joints Neuro: Alert, oriented, conversational, full strength upper and lower extremities, normal gait  ECG: Normal sinus rhythm, left bundle branch block, no Q waves, no ST changes, no T wave inversions but he does have T wave flattening in V4 through 6.

## 2018-06-06 NOTE — Assessment & Plan Note (Signed)
Hemoglobin A1c excellent at 5.9%.  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  Plan to continue with metformin 500 mg daily.  Continue with aspirin 81 and pravastatin 20 mg daily as well for secondary prevention of ischemic vascular disease.

## 2018-06-06 NOTE — Patient Instructions (Addendum)
I think your chest pain might be coming from muscle or tendinitis in your chest.  Your EKG was normal.  If your chest pain worsens or changes please call me and I will get you a stress test to better evaluate your heart.  Continue taking your other medications.  Worsens call me and we will work on changing the losartan to a different medicine.  Follow-up with me in 6 months.  Or sooner if needed.

## 2018-06-06 NOTE — Assessment & Plan Note (Signed)
Atypical chest pain in a patient with known ischemic vascular disease due to diabetes and hypertension.  EKG is reassuring with no acute changes.  Clinically does not sound like angina.  Seems more consistent with a musculoskeletal pain.  Echo last year was essentially normal with some LVH and diastolic dysfunction.  Plan is to continue with medical therapy including aspirin and pravastatin.  Would not add a beta-blocker because heart rate is only 58.  Continue with losartan.  Supportive care, if chest pain changes or worsens, would refer to cardiology for ischemic evaluation.

## 2018-06-06 NOTE — Assessment & Plan Note (Signed)
Blood pressure a little high today, but lots of normal values and other recent visits.  Plan is to continue with amlodipine 10 mg, losartan 100 mg daily, and chlorthalidone 50 mg daily.  Having some dry cough which may be related to the losartan.  We talked about discontinuing the medicine and trying something else, but he says the cough is not that bad right now.  He will call me if he is having worsening symptoms.  Probably we would have to change to something like eplerenone.

## 2018-06-15 ENCOUNTER — Other Ambulatory Visit: Payer: Self-pay | Admitting: Oncology

## 2018-06-15 ENCOUNTER — Other Ambulatory Visit: Payer: Self-pay | Admitting: Student in an Organized Health Care Education/Training Program

## 2018-06-15 DIAGNOSIS — E119 Type 2 diabetes mellitus without complications: Secondary | ICD-10-CM

## 2018-06-15 DIAGNOSIS — I10 Essential (primary) hypertension: Secondary | ICD-10-CM

## 2018-06-23 ENCOUNTER — Telehealth: Payer: Self-pay | Admitting: Pharmacist

## 2018-06-23 MED FILL — XTANDI 40 MG CAPSULE: 40 | 30 days supply | Qty: 120 | Fill #2

## 2018-06-23 NOTE — Telephone Encounter (Signed)
Oral Chemotherapy Pharmacist Encounter  Follow-Up Form  Spoke with patient's wife, [name], today to follow up regarding patient's oral chemotherapy medication: Xtandi (enzalutamide) for the treatment of advanced, castration-resistant prostate cancerin conjunction with Lupron, planned duration until disease progression or unacceptable toxicity.  Original Start date of oral chemotherapy: 04/08/17  Pt is doing well today  Pt reports 0 tablets/doses of Xtandi 40mg  capsules, 4 capsules (160mg ) by mouth once daily without regard to food missed in the last month.   Pt reports the following side effects: tolerating fairly well, but now noticing some darkening of his skin on his chest under the breast area. Not painful, but very noticeable.  Pertinent labs reviewed: OK for continued treatment. We discussed rising PSA level that will be rechecked in Dec late 2019.  Other Issues: We discussed copayment grants that were secured in Oct 2019 and continued [place] acquisition. [name] provided phone number to [name], oral oncology patient advocate, for if/when she has questions about continued Xtandi acquisition in the future.  [name] knows to call the office with questions or concerns. Oral Oncology Clinic will continue to follow.  [name], PharmD, BCPS, BCOP  06/23/2018 11:01 AM Oral Oncology Clinic [phone]

## 2018-07-15 DIAGNOSIS — N35012 Post-traumatic membranous urethral stricture: Secondary | ICD-10-CM | POA: Diagnosis not present

## 2018-07-15 DIAGNOSIS — C7951 Secondary malignant neoplasm of bone: Secondary | ICD-10-CM | POA: Diagnosis not present

## 2018-07-19 ENCOUNTER — Other Ambulatory Visit: Payer: Self-pay | Admitting: Oncology

## 2018-07-19 DIAGNOSIS — C61 Malignant neoplasm of prostate: Secondary | ICD-10-CM

## 2018-07-19 DIAGNOSIS — C7951 Secondary malignant neoplasm of bone: Principal | ICD-10-CM

## 2018-07-22 MED FILL — XTANDI 40 MG CAPSULE: 40 | 30 days supply | Qty: 120 | Fill #0

## 2018-08-05 ENCOUNTER — Other Ambulatory Visit: Payer: Self-pay

## 2018-08-05 ENCOUNTER — Inpatient Hospital Stay: Payer: Medicare Other | Attending: Oncology

## 2018-08-05 DIAGNOSIS — Z79899 Other long term (current) drug therapy: Secondary | ICD-10-CM | POA: Diagnosis not present

## 2018-08-05 DIAGNOSIS — Z7984 Long term (current) use of oral hypoglycemic drugs: Secondary | ICD-10-CM | POA: Diagnosis not present

## 2018-08-05 DIAGNOSIS — C61 Malignant neoplasm of prostate: Secondary | ICD-10-CM | POA: Diagnosis present

## 2018-08-05 DIAGNOSIS — C7951 Secondary malignant neoplasm of bone: Principal | ICD-10-CM

## 2018-08-05 DIAGNOSIS — Z7982 Long term (current) use of aspirin: Secondary | ICD-10-CM | POA: Insufficient documentation

## 2018-08-05 LAB — CBC WITH DIFFERENTIAL (CANCER CENTER ONLY)
Abs Immature Granulocytes: 0.02 10*3/uL (ref 0.00–0.07)
Basophils Absolute: 0 10*3/uL (ref 0.0–0.1)
Basophils Relative: 0 %
Eosinophils Absolute: 0.1 10*3/uL (ref 0.0–0.5)
Eosinophils Relative: 1 %
HCT: 41.6 % (ref 39.0–52.0)
Hemoglobin: 14.2 g/dL (ref 13.0–17.0)
Immature Granulocytes: 0 %
Lymphocytes Relative: 37 %
Lymphs Abs: 2.7 10*3/uL (ref 0.7–4.0)
MCH: 30.7 pg (ref 26.0–34.0)
MCHC: 34.1 g/dL (ref 30.0–36.0)
MCV: 89.8 fL (ref 80.0–100.0)
MONO ABS: 0.8 10*3/uL (ref 0.1–1.0)
MONOS PCT: 10 %
Neutro Abs: 3.8 10*3/uL (ref 1.7–7.7)
Neutrophils Relative %: 52 %
Platelet Count: 233 10*3/uL (ref 150–400)
RBC: 4.63 MIL/uL (ref 4.22–5.81)
RDW: 12.9 % (ref 11.5–15.5)
WBC Count: 7.4 10*3/uL (ref 4.0–10.5)
nRBC: 0 % (ref 0.0–0.2)

## 2018-08-05 LAB — CMP (CANCER CENTER ONLY)
ALT: 29 U/L (ref 0–44)
AST: 23 U/L (ref 15–41)
Albumin: 4 g/dL (ref 3.5–5.0)
Alkaline Phosphatase: 57 U/L (ref 38–126)
Anion gap: 10 (ref 5–15)
BILIRUBIN TOTAL: 0.6 mg/dL (ref 0.3–1.2)
BUN: 10 mg/dL (ref 8–23)
CO2: 29 mmol/L (ref 22–32)
CREATININE: 1.1 mg/dL (ref 0.61–1.24)
Calcium: 10.1 mg/dL (ref 8.9–10.3)
Chloride: 103 mmol/L (ref 98–111)
GFR, Est AFR Am: >60 mL/min (ref >60)
GFR, Estimated: >60 mL/min (ref >60)
GLUCOSE: 97 mg/dL (ref 70–99)
Potassium: 3.7 mmol/L (ref 3.5–5.1)
Sodium: 142 mmol/L (ref 135–145)
Total Protein: 8.5 g/dL — ABNORMAL HIGH (ref 6.5–8.1)

## 2018-08-09 ENCOUNTER — Telehealth: Payer: Self-pay | Admitting: Oncology

## 2018-08-09 ENCOUNTER — Inpatient Hospital Stay (HOSPITAL_BASED_OUTPATIENT_CLINIC_OR_DEPARTMENT_OTHER): Payer: Medicare Other | Admitting: Oncology

## 2018-08-09 ENCOUNTER — Inpatient Hospital Stay: Payer: Medicare Other

## 2018-08-09 VITALS — BP 135/71 | HR 85 | Temp 97.6°F | Resp 18 | Ht 70.0 in | Wt 236.2 lb

## 2018-08-09 DIAGNOSIS — C61 Malignant neoplasm of prostate: Secondary | ICD-10-CM

## 2018-08-09 DIAGNOSIS — Z79899 Other long term (current) drug therapy: Secondary | ICD-10-CM

## 2018-08-09 DIAGNOSIS — Z7982 Long term (current) use of aspirin: Secondary | ICD-10-CM | POA: Diagnosis not present

## 2018-08-09 DIAGNOSIS — C7951 Secondary malignant neoplasm of bone: Secondary | ICD-10-CM | POA: Diagnosis not present

## 2018-08-09 DIAGNOSIS — Z7984 Long term (current) use of oral hypoglycemic drugs: Secondary | ICD-10-CM | POA: Diagnosis not present

## 2018-08-09 NOTE — Telephone Encounter (Signed)
Printed calendar and avs. °

## 2018-08-09 NOTE — Progress Notes (Signed)
Hematology and Oncology Follow Up Visit  [name] [id number] 1935/11/22 82 y.o. 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  He presented and 2009 with PSA was 3.2 and a Gleason score 4+4 = 8.    Prior Therapy: He was treated with androgen deprivation as well as definitive radiation therapy. His PSA nadir was 0.5 in 2009.   He developed advanced disease with PSA in February 2015 was up to 36.35 and a fluoride PET scan showed measurable disease in the second rib as well as the 10th thoracic vertebral body. He was started on androgen deprivation since that time. He had an excellent PSA response initially with a nadir down to 0.08 in April 2016.   His PSA was 0.8 in February 2017 and Casodex was added.   In April 2017 his PSA was 0.28 and started to rise again in July 2017. His PSA was 0.44 and subsequently was 1.09 in October 2017. On 10/09/2016 his PSA was 3.39. His testosterone level was 30.4. Casodex was discontinued in April 2018. His PSA was 7.7 in August 2018.   Current therapy:   Xtandi 160 mg daily started on 04/08/2017.   Androgen deprivation given Dr. Diona Fanti.  Interim History: [name] returns today for a repeat evaluation.  Since last visit, he reports no major changes in his health.  He continues to tolerate Xtandi without any new side effects including no excessive fatigue, tiredness or edema.  He denies any pelvic discomfort or any bone pain.  His performance status and quality of life remains unchanged.  He denies any recent hospitalizations or illnesses.  He does not report any headaches, blurry vision, syncope or seizures.  He denies any alteration in mental status or lethargy.  He does not report any fevers, chills or sweats. He does not report any cough, wheezing or hemoptysis. He does not  report any chest pain, palpitation, orthopnea.  He denies any nausea, vomiting or early satiety.  He denies any changes in bowel habits..  He does not report any hematuria, dysuria. He does not report any ecchymosis or petechiae.  He does not report lymphadenopathy or bruising.  He denies any anxiety or depression.  Remaining review of systems is negative.  Medications: I have reviewed the patient's current medications.  Current Outpatient Medications  Medication Sig Dispense Refill  . amLODipine (NORVASC) 10 MG tablet Take 1 tablet (10 mg total) daily by mouth. 90 tablet 3  . aspirin EC 81 MG tablet Take 1 tablet (81 mg total) by mouth daily. 150 tablet 2  . chlorthalidone (HYGROTON) 50 MG tablet TAKE 1 TABLET (50 MG TOTAL) DAILY BY MOUTH. 90 tablet 3  . fluticasone (FLONASE) 50 MCG/ACT nasal spray PLACE 1 SPRAY INTO BOTH NOSTRILS DAILY. 16 g 5  . Hypromellose (ARTIFICIAL TEARS OP) Place 1 drop into the left eye daily.    . ILEVRO 0.3 % ophthalmic suspension INSTILL 1 DROP INTO RIGHT EYE EVERY DAY  2  . losartan (COZAAR) 100 MG tablet Take 1 tablet (100 mg total) by mouth daily. Please note change in tablet dose. 90 tablet 3  . metFORMIN (GLUCOPHAGE-XR) 500 MG 24 hr tablet TAKE 1 TABLET BY MOUTH EVERY DAY WITH BREAKFAST 90 tablet 1  . potassium chloride (K-DUR) 10 MEQ tablet Take 1 tablet (10 mEq total) by mouth daily. 90 tablet 3  . pravastatin (PRAVACHOL)  20 MG tablet TAKE 1 TABLET (20 MG TOTAL) BY MOUTH EVERY EVENING. 90 tablet 3  . XTANDI 40 MG capsule TAKE 4 CAPSULES (160 MG TOTAL) BY MOUTH DAILY. 120 capsule 2   No current facility-administered medications for this visit.      Allergies:  Allergies  Allergen Reactions  . Aspirin Nausea Only    Patient stated,"I get nauseated with higher doses of Aspirin."    Past Medical History, Surgical history, Social history, and Family History remain unchanged on review today.  Physical Exam:  Blood pressure 135/71, pulse 85, temperature  97.6 F (36.4 C), temperature source Oral, resp. rate 18, height 5\' 10"  (1.778 m), weight 236 lb 3.2 oz (107.1 kg), SpO2 99 %.     ECOG: 1   General appearance: Alert, awake without any distress. Head: Atraumatic without abnormalities Oropharynx: Without any thrush or ulcers. Eyes: No scleral icterus. Lymph nodes: No lymphadenopathy noted in the cervical, supraclavicular, or axillary nodes Heart:regular rate and rhythm, without any murmurs or gallops.   Lung: Clear to auscultation without any rhonchi, wheezes or dullness to percussion. Abdomin: Soft, nontender without any shifting dullness or ascites. Musculoskeletal: No clubbing or cyanosis. Neurological: No motor or sensory deficits. Skin: No rashes or lesions.     Lab Results: Lab Results  Component Value Date   WBC 7.4 08/05/2018   HGB 14.2 08/05/2018   HCT 41.6 08/05/2018   MCV 89.8 08/05/2018   PLT 233 08/05/2018     Chemistry      Component Value Date/Time   NA 142 08/05/2018 1124   NA 141 08/12/2017 1258   K 3.7 08/05/2018 1124   K 3.4 (L) 08/12/2017 1258   CL 103 08/05/2018 1124   CO2 29 08/05/2018 1124   CO2 27 08/12/2017 1258   BUN 10 08/05/2018 1124   BUN 14.9 08/12/2017 1258   CREATININE 1.10 08/05/2018 1124   CREATININE 1.0 08/12/2017 1258      Component Value Date/Time   CALCIUM 10.1 08/05/2018 1124   CALCIUM 9.5 08/12/2017 1258   ALKPHOS 57 08/05/2018 1124   ALKPHOS 62 08/12/2017 1258   AST 23 08/05/2018 1124   AST 36 (H) 08/12/2017 1258   ALT 29 08/05/2018 1124   ALT 43 08/12/2017 1258   BILITOT 0.6 08/05/2018 1124   BILITOT 0.34 08/12/2017 1258      Results for [name], [name] I (MRN [id number]) as of 06/02/2018 14:01  Ref. Range 03/29/2018 08:03 05/31/2018 09:53  Prostate Specific Ag, Serum Latest Ref Range: 0.0 - 4.0 ng/mL 3.9 5.2 (H)       Impression and Plan:   82 year old man with:  1.  Advanced prostate cancer with disease to the bone and adenopathy that is currently  castration-resistant.  He is currently on Xtandi and remains asymptomatic from his disease.  Despite the rise in his PSA he has not reported any new complaints.  Risks and benefits of continuing his therapy at this time versus switching to alternative options.  His PSA is currently pending from today and depending on these results we will arrange for staging work-up and potential salvage therapy if he develops rapid progression of disease.  These options include systemic chemotherapy versus Xofigo among others.    2. Androgen deprivation therapy: He is currently receiving therapy under the care of Dr. Diona Fanti which I recommended continue indefinitely.  3. Bone directed therapy: I recommended calcium and vitamin D supplements.  Is under consideration to start Xgeva under the care of Dr. Diona Fanti.  4.  Prognosis and goals of care: Therapy remains palliative although his performance status is excellent and aggressive therapy is warranted.  5. Follow-up: Will be in 2 months for repeat evaluation.  15  minutes was spent with the patient face-to-face today.  More than 50% of time was dedicated to discussing his laboratory data, options of therapy and answering question regarding plan of care.      Zola Button, MD 12/31/20199:46 AM

## 2018-08-10 LAB — PROSTATE-SPECIFIC AG, SERUM (LABCORP): Prostate Specific Ag, Serum: 6.7 ng/mL — ABNORMAL HIGH (ref 0.0–4.0)

## 2018-08-11 ENCOUNTER — Telehealth: Payer: Self-pay | Admitting: *Deleted

## 2018-08-11 NOTE — Telephone Encounter (Signed)
Spoke with patient's wife. Gave results of last PSA 

## 2018-08-11 NOTE — Telephone Encounter (Signed)
-----   Message from Wyatt Portela, MD sent at 08/11/2018  7:36 AM EST ----- Please let him know his PSA is up but no change.

## 2018-08-18 MED FILL — XTANDI 40 MG CAPSULE: 40 | 30 days supply | Qty: 120 | Fill #1

## 2018-08-21 ENCOUNTER — Other Ambulatory Visit: Payer: Self-pay | Admitting: Student in an Organized Health Care Education/Training Program

## 2018-08-21 DIAGNOSIS — I10 Essential (primary) hypertension: Secondary | ICD-10-CM

## 2018-09-05 ENCOUNTER — Encounter: Payer: Self-pay | Admitting: *Deleted

## 2018-09-19 MED FILL — XTANDI 40 MG CAPSULE: 40 | 30 days supply | Qty: 120 | Fill #2

## 2018-10-11 ENCOUNTER — Other Ambulatory Visit: Payer: Self-pay | Admitting: Oncology

## 2018-10-11 ENCOUNTER — Inpatient Hospital Stay: Payer: Medicare Other | Attending: Oncology

## 2018-10-11 DIAGNOSIS — C7951 Secondary malignant neoplasm of bone: Principal | ICD-10-CM

## 2018-10-11 DIAGNOSIS — E291 Testicular hypofunction: Secondary | ICD-10-CM | POA: Insufficient documentation

## 2018-10-11 DIAGNOSIS — C61 Malignant neoplasm of prostate: Secondary | ICD-10-CM

## 2018-10-11 LAB — CBC WITH DIFFERENTIAL (CANCER CENTER ONLY)
Abs Immature Granulocytes: 0.01 10*3/uL (ref 0.00–0.07)
BASOS PCT: 1 %
Basophils Absolute: 0 10*3/uL (ref 0.0–0.1)
Eosinophils Absolute: 0.1 10*3/uL (ref 0.0–0.5)
Eosinophils Relative: 2 %
HCT: 40.8 % (ref 39.0–52.0)
Hemoglobin: 13.9 g/dL (ref 13.0–17.0)
Immature Granulocytes: 0 %
Lymphocytes Relative: 49 %
Lymphs Abs: 3.2 10*3/uL (ref 0.7–4.0)
MCH: 31.2 pg (ref 26.0–34.0)
MCHC: 34.1 g/dL (ref 30.0–36.0)
MCV: 91.5 fL (ref 80.0–100.0)
Monocytes Absolute: 0.7 10*3/uL (ref 0.1–1.0)
Monocytes Relative: 11 %
NRBC: 0 % (ref 0.0–0.2)
Neutro Abs: 2.4 10*3/uL (ref 1.7–7.7)
Neutrophils Relative %: 37 %
PLATELETS: 210 10*3/uL (ref 150–400)
RBC: 4.46 MIL/uL (ref 4.22–5.81)
RDW: 13.2 % (ref 11.5–15.5)
WBC Count: 6.4 10*3/uL (ref 4.0–10.5)

## 2018-10-11 LAB — CMP (CANCER CENTER ONLY)
ALT: 32 U/L (ref 0–44)
ANION GAP: 11 (ref 5–15)
AST: 28 U/L (ref 15–41)
Albumin: 4 g/dL (ref 3.5–5.0)
Alkaline Phosphatase: 53 U/L (ref 38–126)
BILIRUBIN TOTAL: 0.4 mg/dL (ref 0.3–1.2)
BUN: 19 mg/dL (ref 8–23)
CO2: 25 mmol/L (ref 22–32)
Calcium: 9.6 mg/dL (ref 8.9–10.3)
Chloride: 104 mmol/L (ref 98–111)
Creatinine: 1.21 mg/dL (ref 0.61–1.24)
GFR, Est AFR Am: >60 mL/min (ref >60)
GFR, Estimated: 56 mL/min — ABNORMAL LOW (ref >60)
Glucose, Bld: 98 mg/dL (ref 70–99)
Potassium: 3.7 mmol/L (ref 3.5–5.1)
Sodium: 140 mmol/L (ref 135–145)
Total Protein: 8.3 g/dL — ABNORMAL HIGH (ref 6.5–8.1)

## 2018-10-12 ENCOUNTER — Other Ambulatory Visit: Payer: Self-pay | Admitting: *Deleted

## 2018-10-12 DIAGNOSIS — I10 Essential (primary) hypertension: Secondary | ICD-10-CM

## 2018-10-12 LAB — PROSTATE-SPECIFIC AG, SERUM (LABCORP): Prostate Specific Ag, Serum: 10.2 ng/mL — ABNORMAL HIGH (ref 0.0–4.0)

## 2018-10-12 MED ORDER — CHLORTHALIDONE 50 MG PO TABS: 50.0000 mg | ORAL_TABLET | Freq: Every day | ORAL | 3 refills | Status: DC

## 2018-10-13 ENCOUNTER — Telehealth: Payer: Self-pay | Admitting: Oncology

## 2018-10-13 ENCOUNTER — Inpatient Hospital Stay (HOSPITAL_BASED_OUTPATIENT_CLINIC_OR_DEPARTMENT_OTHER): Payer: Medicare Other | Admitting: Oncology

## 2018-10-13 VITALS — BP 150/76 | HR 67 | Temp 97.7°F | Resp 17 | Ht 70.0 in | Wt 238.5 lb

## 2018-10-13 DIAGNOSIS — Z7189 Other specified counseling: Secondary | ICD-10-CM

## 2018-10-13 DIAGNOSIS — E291 Testicular hypofunction: Secondary | ICD-10-CM | POA: Diagnosis not present

## 2018-10-13 DIAGNOSIS — C61 Malignant neoplasm of prostate: Secondary | ICD-10-CM | POA: Diagnosis not present

## 2018-10-13 DIAGNOSIS — C7951 Secondary malignant neoplasm of bone: Secondary | ICD-10-CM | POA: Diagnosis not present

## 2018-10-13 NOTE — Telephone Encounter (Signed)
Scheduled appt per 3/5 los. ° °Gave patient contrast and the number to central radiology. ° °Printed calendar and avs. °

## 2018-10-13 NOTE — Progress Notes (Signed)
Hematology and Oncology Follow Up Visit  [name] [id number] 1936-01-08 83 y.o. 10/13/2018 9:08 AM [name], MDVincent, [name],*   Principle Diagnosis: 83 year old man with advanced prostate cancer with pelvic adenopathy documented in 2018.  He was diagnosed with PSA was 3.2 and a Gleason score 4+4 = 8 in 2009.  He has castration-resistant disease.  Prior Therapy: He was treated with androgen deprivation as well as definitive radiation therapy. His PSA nadir was 0.5 in 2009.   He developed advanced disease with PSA in February 2015 was up to 36.35 and a fluoride PET scan showed measurable disease in the second rib as well as the 10th thoracic vertebral body. He was started on androgen deprivation since that time. He had an excellent PSA response initially with a nadir down to 0.08 in April 2016.   His PSA was 0.8 in February 2017 and Casodex was added.   In April 2017 his PSA was 0.28 and started to rise again in July 2017. His PSA was 0.44 and subsequently was 1.09 in October 2017. On 10/09/2016 his PSA was 3.39. His testosterone level was 30.4. Casodex was discontinued in April 2018. His PSA was 7.7 in August 2018.   Current therapy:   Xtandi 160 mg daily started on 04/08/2017.   Androgen deprivation given Dr. Diona [name].  Interim History: [name] is here for a follow-up visit.  Since last visit, he reports no major changes in his health.  Continues to be asymptomatic from his prostate cancer.  Denies any worsening bone pain, pathologic fractures or weight loss.  His appetite and quality of life remains unchanged.  He tolerated Xtandi without any recent complaints.  He denies any excessive fatigue or weight gain.  Patient denied any alteration mental status, neuropathy, confusion or dizziness.  Denies any headaches or lethargy.  Denies any night sweats, weight loss or changes in appetite.  Denied orthopnea, dyspnea on exertion or chest discomfort.  Denies  shortness of breath, difficulty breathing hemoptysis or cough.  Denies any abdominal distention, nausea, early satiety or dyspepsia.  Denies any hematuria, frequency, dysuria or nocturia.  Denies any skin irritation, dryness or rash.  Denies any ecchymosis or petechiae.  Denies any lymphadenopathy or clotting.  Denies any heat or cold intolerance.  Denies any anxiety or depression.  Remaining review of system is negative.      Medications: [name] have reviewed the patient's current medications.  Current Outpatient Medications  Medication Sig Dispense Refill  . amLODipine (NORVASC) 10 MG tablet TAKE 1 TABLET (10 MG TOTAL) DAILY BY MOUTH. 90 tablet 3  . aspirin EC 81 MG tablet Take 1 tablet (81 mg total) by mouth daily. 150 tablet 2  . chlorthalidone (HYGROTON) 50 MG tablet Take 1 tablet (50 mg total) by mouth daily. 90 tablet 3  . fluticasone (FLONASE) 50 MCG/ACT nasal spray PLACE 1 SPRAY INTO BOTH NOSTRILS DAILY. 16 g 5  . Hypromellose (ARTIFICIAL TEARS OP) Place 1 drop into the left eye daily.    . ILEVRO 0.3 % ophthalmic suspension INSTILL 1 DROP INTO RIGHT EYE EVERY DAY  2  . losartan (COZAAR) 100 MG tablet Take 1 tablet (100 mg total) by mouth daily. Please note change in tablet dose. 90 tablet 3  . metFORMIN (GLUCOPHAGE-XR) 500 MG 24 hr tablet TAKE 1 TABLET BY MOUTH EVERY DAY WITH BREAKFAST 90 tablet 1  . potassium chloride (K-DUR) 10 MEQ tablet Take 1 tablet (10 mEq total) by mouth daily. 90 tablet 3  .  pravastatin (PRAVACHOL) 20 MG tablet TAKE 1 TABLET (20 MG TOTAL) BY MOUTH EVERY EVENING. 90 tablet 3  . XTANDI 40 MG capsule TAKE 4 CAPSULES (160 MG TOTAL) BY MOUTH DAILY. 120 capsule 2   No current facility-administered medications for this visit.      Allergies:  Allergies  Allergen Reactions  . Aspirin Nausea Only    Patient stated,"[name] get nauseated with higher doses of Aspirin."    Past Medical History, Surgical history, Social history, and Family History remain unchanged on review  today.  Physical Exam:  Blood pressure (!) 150/76, pulse 67, temperature 97.7 F (36.5 C), temperature source Oral, resp. rate 17, height 5\' 10"  (1.778 m), weight 238 lb 8 oz (108.2 kg), SpO2 99 %.      ECOG: 1   General appearance: Comfortable appearing without any discomfort Head: Normocephalic without any trauma Oropharynx: Mucous membranes are moist and pink without any thrush or ulcers. Eyes: Pupils are equal and round reactive to light. Lymph nodes: No cervical, supraclavicular, inguinal or axillary lymphadenopathy.   Heart:regular rate and rhythm.  S1 and S2 without leg edema. Lung: Clear without any rhonchi or wheezes.  No dullness to percussion. Abdomin: Soft, nontender, nondistended with good bowel sounds.  No hepatosplenomegaly. Musculoskeletal: No joint deformity or effusion.  Full range of motion noted. Neurological: No deficits noted on motor, sensory and deep tendon reflex exam. Skin: No petechial rash or dryness.  Appeared moist.  Psychiatric: Mood and affect appeared appropriate.       Lab Results: Lab Results  Component Value Date   WBC 6.4 10/11/2018   HGB 13.9 10/11/2018   HCT 40.8 10/11/2018   MCV 91.5 10/11/2018   PLT 210 10/11/2018     Chemistry      Component Value Date/Time   NA 140 10/11/2018 1257   NA 141 08/12/2017 1258   K 3.7 10/11/2018 1257   K 3.4 (L) 08/12/2017 1258   CL 104 10/11/2018 1257   CO2 25 10/11/2018 1257   CO2 27 08/12/2017 1258   BUN 19 10/11/2018 1257   BUN 14.9 08/12/2017 1258   CREATININE 1.21 10/11/2018 1257   CREATININE 1.0 08/12/2017 1258      Component Value Date/Time   CALCIUM 9.6 10/11/2018 1257   CALCIUM 9.5 08/12/2017 1258   ALKPHOS 53 10/11/2018 1257   ALKPHOS 62 08/12/2017 1258   AST 28 10/11/2018 1257   AST 36 (H) 08/12/2017 1258   ALT 32 10/11/2018 1257   ALT 43 08/12/2017 1258   BILITOT 0.4 10/11/2018 1257   BILITOT 0.34 08/12/2017 1258          Results for [name] (MRN  [id number]) as of 10/13/2018 09:08  Ref. Range 08/09/2018 09:32 10/11/2018 12:57  Prostate Specific Ag, Serum Latest Ref Range: 0.0 - 4.0 ng/mL 6.7 (H) 10.2 (H)    Impression and Plan:   83 year old man with:  1.  Castration-resistant prostate cancer with documented lymphadenopathy.   He is currently on Xtandi with reasonable tolerance and no major complications.  His PSA has been rising with the current doubling time close to 6 months.  The natural course of this disease was discussed again today and treatment options beyond [place] were also reviewed.  These would include Zytiga, Xofigo, systemic chemotherapy among others.  After discussion today given the fact that he is asymptomatic, [name] have opted to continue Xtandi for a short period of time and repeat his staging work-up prior to next visit.  If his  PSA continues to rise or he has evidence of radiographic progression of disease, [name] will switch to different salvage therapy.  Trial of Zytiga prior to systemic chemotherapy would be reasonable at the time.   2. Androgen deprivation therapy: [name] recommended proceeding with androgen deprivation therapy indefinitely.  He is currently receiving it under the care of Dr. Diona [name].  3. Bone directed therapy: [name] will repeat his bone scan in the immediate future and will discuss the need for bone directed therapy at the time.  [name] recommended calcium and vitamin D supplements in the interim.  4.  Prognosis and goals of care: His disease remains incurable although has been under reasonable control.  Aggressive therapy is warranted given his excellent performance status.  5. Follow-up: Will be in 2 months for repeat evaluation.  25  minutes was spent with the patient face-to-face today.  More than 50% of time was dedicated to reviewing laboratory data, disease treatment options and arranging the plan for the future.  Questions about diagnosis and prognosis were also addressed.      Zola Button,  MD 3/5/20209:08 AM

## 2018-10-17 MED FILL — XTANDI 40 MG CAPSULE: 40 | 30 days supply | Qty: 120 | Fill #0

## 2018-11-10 MED FILL — XTANDI 40 MG CAPSULE: 40 | 30 days supply | Qty: 120 | Fill #1

## 2018-11-15 ENCOUNTER — Telehealth: Payer: Self-pay

## 2018-11-15 NOTE — Telephone Encounter (Signed)
Oral Oncology Patient Advocate Encounter  Was successful in securing patient an $3,500 grant from [employer] [place] (PAF) to provide copayment coverage for his Xtandi.  This will keep the out of pocket expense at $0.    I have spoken with the patient.    The billing information is as follows and has been shared with [facility].   Member ID: [id number] Group ID: [id number] RxBin: [id number] Dates of Eligibility: 11/15/18 through 11/15/19  [facility] [place] Phone [phone] Fax [phone] 11/15/2018    2:15 PM

## 2018-12-07 MED FILL — XTANDI 40 MG CAPSULE: 40 | 30 days supply | Qty: 120 | Fill #2

## 2018-12-08 ENCOUNTER — Other Ambulatory Visit: Payer: Self-pay | Admitting: Student in an Organized Health Care Education/Training Program

## 2018-12-08 DIAGNOSIS — E119 Type 2 diabetes mellitus without complications: Secondary | ICD-10-CM

## 2018-12-11 ENCOUNTER — Other Ambulatory Visit: Payer: Self-pay | Admitting: Student in an Organized Health Care Education/Training Program

## 2018-12-14 ENCOUNTER — Other Ambulatory Visit: Payer: Self-pay

## 2018-12-14 ENCOUNTER — Telehealth: Payer: Self-pay

## 2018-12-14 NOTE — Patient Outreach (Signed)
[facility]) Care Management  12/14/2018  [name] 04-22-1936 [id number]   Medication Adherence call to [name] HIPPA Compliant Voice message left with a call back number. [name] is showing past due on Pravachol 20 mg under [place].   [facility] Management Direct Dial [phone]  Fax [phone] [email or web address]

## 2018-12-14 NOTE — Telephone Encounter (Signed)
Received a fax from [facility] in Radiology stating that patient needs labs prior to CT. Labs scheduled for 12/20/18 moved to 12/15/18 per request. Patient aware of 9:00 am lab appointment on 12/15/18 followed by scans.

## 2018-12-15 ENCOUNTER — Ambulatory Visit (HOSPITAL_COMMUNITY)
Admission: RE | Admit: 2018-12-15 | Discharge: 2018-12-15 | Disposition: A | Discharge status: Home / Self Care | Payer: Medicare Other | Source: Ambulatory Visit | Attending: Oncology | Admitting: Oncology

## 2018-12-15 ENCOUNTER — Encounter (HOSPITAL_COMMUNITY)
Admission: RE | Admit: 2018-12-15 | Discharge: 2018-12-15 | Disposition: A | Discharge status: Home / Self Care | Payer: Medicare Other | Source: Ambulatory Visit | Attending: Oncology | Admitting: Oncology

## 2018-12-15 ENCOUNTER — Inpatient Hospital Stay: Payer: Medicare Other | Attending: Oncology

## 2018-12-15 ENCOUNTER — Other Ambulatory Visit: Payer: Self-pay | Admitting: Student in an Organized Health Care Education/Training Program

## 2018-12-15 ENCOUNTER — Other Ambulatory Visit: Payer: Self-pay

## 2018-12-15 DIAGNOSIS — C61 Malignant neoplasm of prostate: Secondary | ICD-10-CM | POA: Insufficient documentation

## 2018-12-15 DIAGNOSIS — C7951 Secondary malignant neoplasm of bone: Secondary | ICD-10-CM | POA: Insufficient documentation

## 2018-12-15 DIAGNOSIS — I10 Essential (primary) hypertension: Secondary | ICD-10-CM

## 2018-12-15 LAB — CBC WITH DIFFERENTIAL (CANCER CENTER ONLY)
Abs Immature Granulocytes: 0.02 10*3/uL (ref 0.00–0.07)
Basophils Absolute: 0 10*3/uL (ref 0.0–0.1)
Basophils Relative: 0 %
Eosinophils Absolute: 0.1 10*3/uL (ref 0.0–0.5)
Eosinophils Relative: 1 %
HCT: 42.3 % (ref 39.0–52.0)
Hemoglobin: 14.6 g/dL (ref 13.0–17.0)
Immature Granulocytes: 0 %
Lymphocytes Relative: 45 %
Lymphs Abs: 4 10*3/uL (ref 0.7–4.0)
MCH: 31.3 pg (ref 26.0–34.0)
MCHC: 34.5 g/dL (ref 30.0–36.0)
MCV: 90.8 fL (ref 80.0–100.0)
Monocytes Absolute: 1.1 10*3/uL — ABNORMAL HIGH (ref 0.1–1.0)
Monocytes Relative: 12 %
Neutro Abs: 3.8 10*3/uL (ref 1.7–7.7)
Neutrophils Relative %: 42 %
Platelet Count: 204 10*3/uL (ref 150–400)
RBC: 4.66 MIL/uL (ref 4.22–5.81)
RDW: 12.9 % (ref 11.5–15.5)
WBC Count: 9.1 10*3/uL (ref 4.0–10.5)
nRBC: 0 % (ref 0.0–0.2)

## 2018-12-15 LAB — CMP (CANCER CENTER ONLY)
ALT: 35 U/L (ref 0–44)
AST: 30 U/L (ref 15–41)
Albumin: 4 g/dL (ref 3.5–5.0)
Alkaline Phosphatase: 60 U/L (ref 38–126)
Anion gap: 13 (ref 5–15)
BUN: 14 mg/dL (ref 8–23)
CO2: 27 mmol/L (ref 22–32)
Calcium: 10.1 mg/dL (ref 8.9–10.3)
Chloride: 100 mmol/L (ref 98–111)
Creatinine: 1.03 mg/dL (ref 0.61–1.24)
GFR, Est AFR Am: >60 mL/min (ref >60)
GFR, Estimated: >60 mL/min (ref >60)
Glucose, Bld: 112 mg/dL — ABNORMAL HIGH (ref 70–99)
Potassium: 3.1 mmol/L — ABNORMAL LOW (ref 3.5–5.1)
Sodium: 140 mmol/L (ref 135–145)
Total Bilirubin: 0.6 mg/dL (ref 0.3–1.2)
Total Protein: 8.9 g/dL — ABNORMAL HIGH (ref 6.5–8.1)

## 2018-12-15 MED ORDER — IOHEXOL 300 MG/ML  SOLN
100.0000 mL | Freq: Once | INTRAMUSCULAR | Status: AC | PRN
  Administered 2018-12-15: 100 mL via INTRAVENOUS

## 2018-12-15 MED ORDER — TECHNETIUM TC 99M MEDRONATE IV KIT
20.9000 | PACK | Freq: Once | INTRAVENOUS | Status: AC | PRN
  Administered 2018-12-15: 10:00:00 20.9 via INTRAVENOUS

## 2018-12-15 MED ORDER — SODIUM CHLORIDE (PF) 0.9 % IJ SOLN
INTRAMUSCULAR | Status: AC
  Filled 2018-12-15: qty 50

## 2018-12-15 NOTE — Telephone Encounter (Signed)
Next appt scheduled 6/8 with PCP. 

## 2018-12-16 LAB — PROSTATE-SPECIFIC AG, SERUM (LABCORP): Prostate Specific Ag, Serum: 15.3 ng/mL — ABNORMAL HIGH (ref 0.0–4.0)

## 2018-12-20 ENCOUNTER — Other Ambulatory Visit: Payer: Medicare Other

## 2018-12-22 ENCOUNTER — Inpatient Hospital Stay (HOSPITAL_BASED_OUTPATIENT_CLINIC_OR_DEPARTMENT_OTHER): Payer: Medicare Other | Admitting: Oncology

## 2018-12-22 DIAGNOSIS — C7951 Secondary malignant neoplasm of bone: Secondary | ICD-10-CM | POA: Diagnosis not present

## 2018-12-22 DIAGNOSIS — Z79899 Other long term (current) drug therapy: Secondary | ICD-10-CM

## 2018-12-22 DIAGNOSIS — C61 Malignant neoplasm of prostate: Secondary | ICD-10-CM | POA: Diagnosis not present

## 2018-12-22 NOTE — Progress Notes (Signed)
Hematology and Oncology Follow Up for Telemedicine Visits  [name] [id number] [birth year] 83 y.o. 12/22/2018 8:36 AM [name], MDVincent, [name],*   [name] connected with [name] on 12/22/18 at  9:30 AM EDT by telephone visit and verified that [name] am speaking with the correct person using two identifiers.   [name] discussed the limitations, risks, security and privacy concerns of performing an evaluation and management service by telemedicine and the availability of in-person appointments. [name] also discussed with the patient that there may be a patient responsible charge related to this service. The patient expressed understanding and agreed to proceed.  Other persons participating in the visit and their role in the encounter:    Patient's location: Home Provider's location: Office   Principle Diagnosis:  83 year old man with castration-resistant prostate cancer with pelvic adenopathy diagnosed in 2018. He was diagnosed with PSA was 3.2 and a Gleason score 4+4 = 8 in 2009.   Prior Therapy: He was treated with androgen deprivation as well as definitive radiation therapy. His PSA nadir was 0.5 in 2009.   He developed advanced disease with PSA in February 2015 was up to 36.35 and a fluoride PET scan showed measurable disease in the second rib as well as the 10th thoracic vertebral body. He was started on androgen deprivation since that time. He had an excellent PSA response initially with a nadir down to 0.08 in April 2016.   His PSA was 0.8 in February 2017 and Casodex was added.   In April 2017 his PSA was 0.28 and started to rise again in July 2017. His PSA was 0.44 and subsequently was 1.09 in October 2017. On 10/09/2016 his PSA was 3.39. His testosterone level was 30.4. Casodex was discontinued in April 2018. His PSA was 7.7 in August 2018.   Current therapy:   Xtandi 160 mg daily started on 04/08/2017.   Androgen deprivation given Dr. Diona Fanti.   Interim  History: Mr. Mayeda reports no major changes in his health.  He continues to be active and attends to activities of daily living.  Denies any worsening back pain, shoulder pain or hip pain.  He denies any recent hospitalizations or illnesses.  His appetite and quality of life remains unchanged.    Medications: [name] have reviewed the patient's current medications.  Current Outpatient Medications  Medication Sig Dispense Refill  . amLODipine (NORVASC) 10 MG tablet TAKE 1 TABLET (10 MG TOTAL) DAILY BY MOUTH. 90 tablet 3  . chlorthalidone (HYGROTON) 50 MG tablet Take 1 tablet (50 mg total) by mouth daily. 90 tablet 3  . fluticasone (FLONASE) 50 MCG/ACT nasal spray PLACE 1 SPRAY INTO BOTH NOSTRILS DAILY. 16 g 5  . Hypromellose (ARTIFICIAL TEARS OP) Place 1 drop into the left eye daily.    . ILEVRO 0.3 % ophthalmic suspension INSTILL 1 DROP INTO RIGHT EYE EVERY DAY  2  . KLOR-CON M10 10 MEQ tablet TAKE 1 TABLET BY MOUTH EVERY DAY 90 tablet 3  . losartan (COZAAR) 100 MG tablet Take 1 tablet (100 mg total) by mouth daily. Please note change in tablet dose. 90 tablet 3  . metFORMIN (GLUCOPHAGE-XR) 500 MG 24 hr tablet TAKE 1 TABLET BY MOUTH EVERY DAY WITH BREAKFAST 90 tablet 1  . pravastatin (PRAVACHOL) 20 MG tablet TAKE 1 TABLET BY MOUTH EVERY DAY IN THE EVENING 90 tablet 3  . XTANDI 40 MG capsule TAKE 4 CAPSULES (160 MG TOTAL) BY MOUTH DAILY. 120 capsule 2   No current facility-administered  medications for this visit.      Allergies:  Allergies  Allergen Reactions  . Aspirin Nausea Only    Patient stated,"[name] get nauseated with higher doses of Aspirin."    Past Medical History, Surgical history, Social history, and Family History were reviewed and updated.   Lab Results: Lab Results  Component Value Date   WBC 9.1 12/15/2018   HGB 14.6 12/15/2018   HCT 42.3 12/15/2018   MCV 90.8 12/15/2018   PLT 204 12/15/2018     Chemistry      Component Value Date/Time   NA 140 12/15/2018 0908   NA  141 08/12/2017 1258   K 3.1 (L) 12/15/2018 0908   K 3.4 (L) 08/12/2017 1258   CL 100 12/15/2018 0908   CO2 27 12/15/2018 0908   CO2 27 08/12/2017 1258   BUN 14 12/15/2018 0908   BUN 14.9 08/12/2017 1258   CREATININE 1.03 12/15/2018 0908   CREATININE 1.0 08/12/2017 1258      Component Value Date/Time   CALCIUM 10.1 12/15/2018 0908   CALCIUM 9.5 08/12/2017 1258   ALKPHOS 60 12/15/2018 0908   ALKPHOS 62 08/12/2017 1258   AST 30 12/15/2018 0908   AST 36 (H) 08/12/2017 1258   ALT 35 12/15/2018 0908   ALT 43 08/12/2017 1258   BILITOT 0.6 12/15/2018 0908   BILITOT 0.34 08/12/2017 1258     Results for [name] (MRN [id number]) as of 12/22/2018 08:00  Ref. Range 10/11/2018 12:57 12/15/2018 09:08  Prostate Specific Ag, Serum Latest Ref Range: 0.0 - 4.0 ng/mL 10.2 (H) 15.3 (H)    Radiological Studies: EXAM: CT ABDOMEN AND PELVIS WITH CONTRAST  TECHNIQUE: Multidetector CT imaging of the abdomen and pelvis was performed using the standard protocol following bolus administration of intravenous contrast.  CONTRAST:  152mL OMNIPAQUE IOHEXOL 300 MG/ML  SOLN  COMPARISON:  Multiple exams, including 01/20/2018  FINDINGS: Lower chest: Gynecomastia. Faint calcification in the right coronary artery.  Hepatobiliary: Diffuse hepatic steatosis. Slightly lobular contour of the liver, morphology suspicious for early cirrhosis. Faint dependent density in the gallbladder could be from sludge or gallstones. No biliary dilatation.  Pancreas: Unremarkable  Spleen: Unremarkable  Adrenals/Urinary Tract: Adrenal glands normal. No significant renal abnormality. No urinary tract calculi.  Stomach/Bowel: Sigmoid colon diverticulosis.  Vascular/Lymphatic: Aortoiliac atherosclerotic vascular disease. Worsening retroperitoneal adenopathy noted. Just above the left renal vein a left periaortic node measures 1.8 cm in short axis on image 31/2, formerly 0.9 cm. An aortocaval node  measures 1.4 cm in short axis on image 36/2, formerly 1.0 cm. A lower aortocaval node measures 2.1 cm in short axis on image 52/2, formerly 1.4 cm. Small bilateral pelvic lymph nodes are not overtly pathologically enlarged, and similar to prior.  Reproductive: Brachytherapy seeds are present throughout the relatively small sized prostate gland.  Other: No supplemental non-categorized findings.  Musculoskeletal: Scattered mostly small sclerotic lesions are present in the ribs, lumbar spine, bony pelvis. The for the most part these are very similar to the prior exam. An index lesion in the right side of the L2 vertebral body on image 51/5 measures 1.1 cm craniocaudad, formerly 1.0 cm.  IMPRESSION: 1. Worsening periaortic and retroperitoneal adenopathy. 2. Stable scattered tiny sclerotic lesions scattered throughout the skeleton compatible with prior osseous metastatic lesions. For the most part these appear relatively stable. 3. Other imaging findings of potential clinical significance: Gynecomastia. Diffuse hepatic steatosis with suspicion for early cirrhosis. Sludge or gallstones in the gallbladder. Sigmoid colon diverticulosis. Aortic Atherosclerosis (  ICD10-I70.0). Brachytherapy seeds in the prostate gland.  EXAM: NUCLEAR MEDICINE WHOLE BODY BONE SCAN  TECHNIQUE: Whole body anterior and posterior images were obtained approximately 3 hours after intravenous injection of radiopharmaceutical.  RADIOPHARMACEUTICALS:  20.9 mCi Technetium-26m MDP IV  COMPARISON:  Bone scan 01/20/2018, CT abdomen pelvis 12/15/2018  FINDINGS: Several new discrete foci of radiotracer accumulation in the posterior RIGHT ribs. Additionally several foci of new uptake within the thoracic spine vertebral bodies. These findings are seen on the posterior projection. Subtle lesion in the LEFT ischium laterally. This lesion corresponds to the smudgy 10 mm sclerotic lesion on coronal CT image  60/4).  IMPRESSION: 1. New foci of radiotracer uptake within the posterior RIGHT ribs, midthoracic vertebral bodies bodies and LEFT ischium consistent with progression skeletal metastasis. 2. The LEFT ischial lesion is represented on comparison same day CT.  Impression and Plan:   83 year old man with:  1.    Advanced prostate cancer with disease to the bone and lymphadenopathy that is currently castration-resistant.   He is tolerating Xtandi without any major complications although his PSA is starting to rise.  Staging work-up loading CT scan and bone scan obtained on Dec 15, 2018 were personally reviewed and showed mild progression of disease.  Treatment options were reviewed today which include continuing Xtandi, switching to Zytiga versus systemic chemotherapy were reviewed.  After discussion today, he is asymptomatic and opted to continue with the current regimen and defer changes for the time being.   2. Androgen deprivation therapy: He continues to receive that under care of Dr. Diona Fanti which [name] recommended continuing indefinitely.  3. Bone directed therapy: [name] recommended calcium and vitamin D supplements with Xgeva consideration in the future after obtaining dental clearance.  4.  Prognosis and goals of care: His disease remains incurable although aggressive therapy is warranted given his excellent performance status.  5. Follow-up: Will be in 2 months for repeat evaluation.      [name] discussed the assessment and treatment plan with the patient. The patient was provided an opportunity to ask questions and all were answered. The patient agreed with the plan and demonstrated an understanding of the instructions.   The patient was advised to call back or seek an in-person evaluation if the symptoms worsen or if the condition fails to improve as anticipated.  [name] provided 20 minutes of non face-to-face telephone visit time during this encounter, and > 50% was dedicated to  reviewing his disease status, treatment options and complications related to these therapies.  Zola Button, MD 12/22/2018 8:36 AM

## 2018-12-23 ENCOUNTER — Telehealth: Payer: Self-pay | Admitting: Oncology

## 2018-12-23 NOTE — Telephone Encounter (Signed)
Called regarding schedule °

## 2019-01-16 ENCOUNTER — Telehealth: Payer: Self-pay

## 2019-01-16 ENCOUNTER — Encounter: Payer: Self-pay | Admitting: Student in an Organized Health Care Education/Training Program

## 2019-01-16 ENCOUNTER — Ambulatory Visit (INDEPENDENT_AMBULATORY_CARE_PROVIDER_SITE_OTHER): Payer: Medicare Other | Admitting: Student in an Organized Health Care Education/Training Program

## 2019-01-16 ENCOUNTER — Other Ambulatory Visit: Payer: Self-pay

## 2019-01-16 ENCOUNTER — Other Ambulatory Visit: Payer: Self-pay | Admitting: Oncology

## 2019-01-16 VITALS — BP 145/71 | HR 63 | Temp 97.8°F | Ht 70.0 in | Wt 236.2 lb

## 2019-01-16 DIAGNOSIS — I10 Essential (primary) hypertension: Secondary | ICD-10-CM | POA: Diagnosis not present

## 2019-01-16 DIAGNOSIS — E1169 Type 2 diabetes mellitus with other specified complication: Secondary | ICD-10-CM | POA: Diagnosis not present

## 2019-01-16 DIAGNOSIS — C7951 Secondary malignant neoplasm of bone: Secondary | ICD-10-CM

## 2019-01-16 DIAGNOSIS — N5089 Other specified disorders of the male genital organs: Secondary | ICD-10-CM

## 2019-01-16 DIAGNOSIS — N3942 Incontinence without sensory awareness: Secondary | ICD-10-CM

## 2019-01-16 DIAGNOSIS — Z79899 Other long term (current) drug therapy: Secondary | ICD-10-CM

## 2019-01-16 DIAGNOSIS — M25642 Stiffness of left hand, not elsewhere classified: Secondary | ICD-10-CM | POA: Diagnosis not present

## 2019-01-16 DIAGNOSIS — M2141 Flat foot [pes planus] (acquired), right foot: Secondary | ICD-10-CM

## 2019-01-16 DIAGNOSIS — M25641 Stiffness of right hand, not elsewhere classified: Secondary | ICD-10-CM | POA: Diagnosis not present

## 2019-01-16 DIAGNOSIS — C61 Malignant neoplasm of prostate: Secondary | ICD-10-CM

## 2019-01-16 DIAGNOSIS — M19041 Primary osteoarthritis, right hand: Secondary | ICD-10-CM | POA: Insufficient documentation

## 2019-01-16 DIAGNOSIS — M2142 Flat foot [pes planus] (acquired), left foot: Secondary | ICD-10-CM

## 2019-01-16 DIAGNOSIS — R32 Unspecified urinary incontinence: Secondary | ICD-10-CM | POA: Insufficient documentation

## 2019-01-16 DIAGNOSIS — B351 Tinea unguium: Secondary | ICD-10-CM

## 2019-01-16 DIAGNOSIS — M2011 Hallux valgus (acquired), right foot: Secondary | ICD-10-CM | POA: Insufficient documentation

## 2019-01-16 DIAGNOSIS — Z7984 Long term (current) use of oral hypoglycemic drugs: Secondary | ICD-10-CM

## 2019-01-16 DIAGNOSIS — M19042 Primary osteoarthritis, left hand: Secondary | ICD-10-CM

## 2019-01-16 LAB — POCT GLYCOSYLATED HEMOGLOBIN (HGB A1C): Hemoglobin A1C: 5.8 % — AB (ref 4.0–5.6)

## 2019-01-16 LAB — GLUCOSE, CAPILLARY: Glucose-Capillary: 104 mg/dL — ABNORMAL HIGH (ref 70–99)

## 2019-01-16 NOTE — Patient Instructions (Signed)
Type 2 diabetes which is been under excellent control.  Your blood pressure is also well controlled.  Continue with those medications as prescribed.  We talked about the skin breakdown in your groin.  I think that you are doing everything you can with frequent dependent changes and barrier creams.  Talk with your urologist as they your office may have other treatment options.  We talked about your hand pain which is most likely coming from regular arthritis.  Cannot see any signs of rheumatoid arthritis on your exam.  If your pain worsens or persists, call and check some blood work and x-rays of the hands.  We talked about your right foot pain which could be coming from a bunion, potentially could be from gout.  Because of your underlying diabetes, middle arch problem, and bunion, will refer you to podiatry for ongoing foot care.

## 2019-01-16 NOTE — Assessment & Plan Note (Addendum)
Blood pressure is well controlled today.  Plan is to continue with amlodipine 10 mg, chlorthalidone 50 mg, and losartan 100 mg daily.  Labs from May were fine.

## 2019-01-16 NOTE — Assessment & Plan Note (Signed)
Difficult problem of continuous urine leakage and sensory urinary incontinence related to prostate cancer.  He wears depends, changes every 3-4 hours.  He has some moisture associated breakdown of his skin on his scrotum, base of his penis, and bilateral inguinal folds.  See much signs that this is fungal related.  I think this is probably due to moisture and chemical irritation.  We talked about barrier creams.  Talked about changing his depends more frequently.  He is tried to find a condom catheter to use at night had difficulty with the device.  We talked about follow-up with his urologist who may have other treatment recommendations.

## 2019-01-16 NOTE — Progress Notes (Signed)
   Assessment and Plan:  See Encounters tab for problem-based medical decision making.   __________________________________________________________  HPI:   83 year old man here for follow-up of diabetes.  Reports doing fairly well at home.  No significant recent illnesses.  No COVID-19 risk factors.  Has several complaints today that are acute.  He has noted increasing stiffness of both of his hands over the last few weeks.  Worse in the morning, improves after only a few minutes.  Currently not function limiting.  No pain or stiffness in shoulders, neck, knees, or hips.  He reports some increasing skin breakdown at his bilateral inguinal folds in his groin.  He has struggled with urinary incontinence related to prostate cancer treatment for few years.  He wears depends currently.  He has almost continuous leakage without much sensation.  He change his depends every 3-4 hours.  He is noted increasing breakdown of the skin at the base of his penis and scrotum.  He has used barrier creams in the past with limited benefit.  He tried a condom catheter at night.  Struggles sleeping because he has to change so frequently throughout the night.  Follows with urology office as well.  No fevers or chills.  Complains of right foot pain.  Present at the base of his first MTP.  Reports having a bunion for many years.  Says that the pain started overnight.  Gradually improved over the coming days.  Currently not function limiting.  Also has flat feet. no history of gout.  __________________________________________________________  Problem List: Patient Active Problem List   Diagnosis Date Noted  . Prostate cancer metastatic to bone ([place]) 04/03/2009    Priority: High  . Essential hypertension 07/09/2006    Priority: High  . Urinary incontinence 01/16/2019    Priority: Medium  . Type 2 diabetes mellitus with other specified complication ([place]) 76/16/0737    Priority: Medium  . Hallux valgus of right foot  01/16/2019    Priority: Low  . Sensorineural hearing loss (SNHL) of both ears 06/28/2017    Priority: Low  . Hyperlipidemia 05/20/2015    Priority: Low  . Obstructive sleep apnea 07/06/2007    Priority: Low  . Allergic rhinitis 07/09/2006    Priority: Low  . Osteoarthritis of left knee 07/09/2006    Priority: Low  . Arthritis of both hands 01/16/2019    Medications: Reconciled today in Epic __________________________________________________________  Physical Exam:  Vital Signs: Vitals:   01/16/19 0950  BP: (!) 145/71  Pulse: 63  Temp: 97.8 F (36.6 C)  TempSrc: Oral  SpO2: 99%  Weight: 236 lb 3.2 oz (107.1 kg)    Gen: Well appearing, NAD Neck: No cervical LAD, No thyromegaly or nodules, No JVD. CV: RRR, no murmurs Pulm: Normal effort, CTA throughout, no wheezing Genital: There is skin maceration and breakdown at the base of the penis, base of the scrotum, bilateral inguinal folds.  No rash, no erythema, no satellite lesions.  Base of the penis is hypopigmented, there are hypopigmented patches in the inguinal folds as well. Ext: Warm, no edema, normal joints

## 2019-01-16 NOTE — Assessment & Plan Note (Signed)
Hemoglobin A1c 5.8%, excellent control.  Plan is to continue with metformin 500 mg XR daily.

## 2019-01-16 NOTE — Assessment & Plan Note (Signed)
Acute right forefoot pain.  I think the differential could be associated with the hallux valgus of the right foot.  Also decide me treatment raises suspicion for gout, though he has never had a history of gout before.  He also has a loss of his medial arch.  Good sensation of the foot otherwise.  He has some chronic stasis changes of the foot and onychomycosis.  This is all in the setting of well-controlled diabetes.  Plan is to get me to refer him to podiatry, may benefit from diabetic shoes or orthotic inserts.  If pain is associated with a hallux valgus could consider surgical correction.

## 2019-01-16 NOTE — Telephone Encounter (Signed)
Oral Oncology Patient Advocate Encounter  The patients wife called and said that the patient had a couple of days worth of medicine left and needs his Xtandi refilled. [place] spoke to him a couple weeks ago and he told them he wouldn't need his medicine for about a month.  I sent the refill request to the doctor and will work with the pharmacy to get the [facility] mailed to the patient tomorrow, 6/9 if possible.  The patients wife also said he had some chaffing on his bottom and was not sure if it was because of the medicine. I currently do not have a pharmacist available but I told her I would pass this information along.  She verbalized understanding and great appreciation.  [place] Patient Dougherty Phone [phone] Fax [phone] 01/16/2019   2:23 PM

## 2019-01-17 ENCOUNTER — Encounter: Payer: Self-pay | Admitting: *Deleted

## 2019-01-17 ENCOUNTER — Other Ambulatory Visit: Payer: Self-pay | Admitting: Oncology

## 2019-01-17 DIAGNOSIS — C61 Malignant neoplasm of prostate: Secondary | ICD-10-CM

## 2019-01-17 DIAGNOSIS — C7951 Secondary malignant neoplasm of bone: Secondary | ICD-10-CM

## 2019-01-17 MED FILL — XTANDI 40 MG CAPSULE: 40 | 30 days supply | Qty: 120 | Fill #0

## 2019-01-20 ENCOUNTER — Telehealth: Payer: Self-pay | Admitting: Pharmacist

## 2019-01-20 NOTE — Telephone Encounter (Signed)
Oral Chemotherapy Pharmacist Encounter   Spoke with patient's wife, Otila Kluver, today to follow up regarding patient's oral chemotherapy medication: Xtandi (enzalutamide) for the treatment of advanced, castration-resistant prostate cancerin conjunction with Lupron, planned duration until disease progression or unacceptable toxicity.  Original Start date of oral chemotherapy: 04/08/17  Pt is doing well today  Pt reports 0 tablets/doses of Xtandi 40mg  capsules, 4 capsules (160mg ) by mouth once daily without regard to food missed in the last month.   Pt reports the following side effects: tolerating fairly well, continued darkening of his skin on his chest under the breast area. Not painful, but very noticeable. Some pain in knees and back, manageable. Energy has decreased since Xtandi start. Patient does endorse some hot flashes.  Pertinent labs reviewed: OK for continued treatment. We discussed rising PSA level and CT scan performed in May 2020.  Other Issues: We discussed copayment grants that were secured in Oct 2019 and April 2020.  I verified with the [facility] outpatient pharmacy that PAF grant was used for 01/17/2019 fill as they had received a letter stating the account would need to have activity on it in order to continue to stay active.  I recommended that Mr. Tonche use a cell phone so that Otila Kluver can listen and be involved in July 2020 office visits at [facility].  Otila Kluver expressed understanding and appreciation. They know to call the office with questions or concerns.  [name], PharmD, BCPS, BCOP  01/20/2019 2:00 PM Oral Oncology Clinic [phone]

## 2019-01-25 ENCOUNTER — Encounter: Payer: Self-pay | Admitting: *Deleted

## 2019-02-14 MED FILL — XTANDI 40 MG CAPSULE: 40 | 30 days supply | Qty: 120 | Fill #1

## 2019-02-16 ENCOUNTER — Other Ambulatory Visit: Payer: Self-pay

## 2019-02-16 ENCOUNTER — Inpatient Hospital Stay: Payer: Medicare Other | Attending: Oncology

## 2019-02-16 DIAGNOSIS — E291 Testicular hypofunction: Secondary | ICD-10-CM | POA: Diagnosis not present

## 2019-02-16 DIAGNOSIS — C7951 Secondary malignant neoplasm of bone: Secondary | ICD-10-CM | POA: Insufficient documentation

## 2019-02-16 DIAGNOSIS — C61 Malignant neoplasm of prostate: Secondary | ICD-10-CM | POA: Diagnosis present

## 2019-02-16 LAB — CMP (CANCER CENTER ONLY)
ALT: 30 U/L (ref 0–44)
AST: 28 U/L (ref 15–41)
Albumin: 4 g/dL (ref 3.5–5.0)
Alkaline Phosphatase: 64 U/L (ref 38–126)
Anion gap: 11 (ref 5–15)
BUN: 15 mg/dL (ref 8–23)
CO2: 24 mmol/L (ref 22–32)
Calcium: 9.5 mg/dL (ref 8.9–10.3)
Chloride: 104 mmol/L (ref 98–111)
Creatinine: 1.15 mg/dL (ref 0.61–1.24)
GFR, Est AFR Am: >60 mL/min (ref >60)
GFR, Estimated: 60 mL/min — ABNORMAL LOW (ref >60)
Glucose, Bld: 134 mg/dL — ABNORMAL HIGH (ref 70–99)
Potassium: 3.4 mmol/L — ABNORMAL LOW (ref 3.5–5.1)
Sodium: 139 mmol/L (ref 135–145)
Total Bilirubin: 0.7 mg/dL (ref 0.3–1.2)
Total Protein: 8.4 g/dL — ABNORMAL HIGH (ref 6.5–8.1)

## 2019-02-16 LAB — CBC WITH DIFFERENTIAL (CANCER CENTER ONLY)
Abs Immature Granulocytes: 0.01 10*3/uL (ref 0.00–0.07)
Basophils Absolute: 0 10*3/uL (ref 0.0–0.1)
Basophils Relative: 0 %
Eosinophils Absolute: 0.1 10*3/uL (ref 0.0–0.5)
Eosinophils Relative: 1 %
HCT: 40.7 % (ref 39.0–52.0)
Hemoglobin: 14.1 g/dL (ref 13.0–17.0)
Immature Granulocytes: 0 %
Lymphocytes Relative: 49 %
Lymphs Abs: 3.1 10*3/uL (ref 0.7–4.0)
MCH: 30.9 pg (ref 26.0–34.0)
MCHC: 34.6 g/dL (ref 30.0–36.0)
MCV: 89.1 fL (ref 80.0–100.0)
Monocytes Absolute: 0.7 10*3/uL (ref 0.1–1.0)
Monocytes Relative: 11 %
Neutro Abs: 2.5 10*3/uL (ref 1.7–7.7)
Neutrophils Relative %: 39 %
Platelet Count: 199 10*3/uL (ref 150–400)
RBC: 4.57 MIL/uL (ref 4.22–5.81)
RDW: 13.1 % (ref 11.5–15.5)
WBC Count: 6.4 10*3/uL (ref 4.0–10.5)
nRBC: 0 % (ref 0.0–0.2)

## 2019-02-17 ENCOUNTER — Telehealth: Payer: Self-pay

## 2019-02-17 LAB — PROSTATE-SPECIFIC AG, SERUM (LABCORP): Prostate Specific Ag, Serum: 19.2 ng/mL — ABNORMAL HIGH (ref 0.0–4.0)

## 2019-02-17 NOTE — Telephone Encounter (Signed)
Oral Oncology Patient Advocate Encounter   Was successful in securing patient a $4250 grant from [employer] to provide copayment coverage for Xtandi.  This will keep the out of pocket expense at $0.     I called and left a voicemail for the patient.    The billing information is as follows and has been shared with [place].   Member ID: [id number] Group ID: [id number] RxBin: [id number] PCN: [id number] Dates of Eligibility: 02/16/19 through 02/16/20  [place] [place] [facility] Patient [name] Phone [phone] Fax [phone] 02/17/2019    11:46 AM

## 2019-02-22 ENCOUNTER — Inpatient Hospital Stay (HOSPITAL_BASED_OUTPATIENT_CLINIC_OR_DEPARTMENT_OTHER): Payer: Medicare Other | Admitting: Oncology

## 2019-02-22 ENCOUNTER — Other Ambulatory Visit: Payer: Self-pay

## 2019-02-22 ENCOUNTER — Telehealth: Payer: Self-pay | Admitting: Oncology

## 2019-02-22 VITALS — BP 136/73 | HR 84 | Temp 98.0°F | Resp 18 | Ht 70.0 in | Wt 234.2 lb

## 2019-02-22 DIAGNOSIS — C7951 Secondary malignant neoplasm of bone: Secondary | ICD-10-CM | POA: Diagnosis not present

## 2019-02-22 DIAGNOSIS — Z7189 Other specified counseling: Secondary | ICD-10-CM

## 2019-02-22 DIAGNOSIS — E291 Testicular hypofunction: Secondary | ICD-10-CM | POA: Diagnosis not present

## 2019-02-22 DIAGNOSIS — C61 Malignant neoplasm of prostate: Secondary | ICD-10-CM | POA: Diagnosis not present

## 2019-02-22 NOTE — Progress Notes (Signed)
Hematology and Oncology Follow Up Visit  [name] [id number] 30-Oct-1935 83 y.o. 02/22/2019 10:56 AM [name], MDVincent, [name],*   Principle Diagnosis: 84 year old man with castration-resistant prostate cancer diagnosed in 2018.  He presented with pelvic adenopathy after initially diagnosed with Gleason score 4+4 = 8 in 2009.    Prior Therapy: He was treated with androgen deprivation as well as definitive radiation therapy. His PSA nadir was 0.5 in 2009.   He developed advanced disease with PSA in February 2015 was up to 36.35 and a fluoride PET scan showed measurable disease in the second rib as well as the 10th thoracic vertebral body. He was started on androgen deprivation since that time. He had an excellent PSA response initially with a nadir down to 0.08 in April 2016.   His PSA was 0.8 in February 2017 and Casodex was added.   In April 2017 his PSA was 0.28 and started to rise again in July 2017. His PSA was 0.44 and subsequently was 1.09 in October 2017. On 10/09/2016 his PSA was 3.39. His testosterone level was 30.4. Casodex was discontinued in April 2018. His PSA was 7.7 in August 2018.   Current therapy:   Xtandi 160 mg daily started on 04/08/2017.   Androgen deprivation given Dr. Diona Fanti.  Interim History: [name] returns today for a follow-up.  Since the last visit, he reports no major changes in his health.  Continues to tolerate Xtandi without any recent hospitalization or illnesses.  He denies any edema, fatigue or increase in his blood pressure.  His quality of life remains unchanged.  He denies any urinary complaints or hematuria.  He denied headaches, blurry vision, syncope or seizures.  Denies any fevers, chills or sweats.  Denied chest pain, palpitation, orthopnea or leg edema.  Denied cough, wheezing or hemoptysis.  Denied nausea, vomiting or abdominal pain.  Denies any constipation or diarrhea.  Denies any frequency urgency or  hesitancy.  Denies any arthralgias or myalgias.  Denies any skin rashes or lesions.  Denies any bleeding or clotting tendency.  Denies any easy bruising.  Denies any hair or nail changes.  Denies any anxiety or depression.  Remaining review of system is negative.       Medications: Medication updated without any changes. Current Outpatient Medications  Medication Sig Dispense Refill  . alendronate (FOSAMAX) 70 MG tablet TAKE 1 TABLET WEEKLY    . amLODipine (NORVASC) 10 MG tablet TAKE 1 TABLET (10 MG TOTAL) DAILY BY MOUTH. 90 tablet 3  . chlorthalidone (HYGROTON) 50 MG tablet Take 1 tablet (50 mg total) by mouth daily. 90 tablet 3  . fluticasone (FLONASE) 50 MCG/ACT nasal spray PLACE 1 SPRAY INTO BOTH NOSTRILS DAILY. 16 g 5  . Hypromellose (ARTIFICIAL TEARS OP) Place 1 drop into the left eye daily.    . ILEVRO 0.3 % ophthalmic suspension INSTILL 1 DROP INTO RIGHT EYE EVERY DAY  2  . KLOR-CON M10 10 MEQ tablet TAKE 1 TABLET BY MOUTH EVERY DAY 90 tablet 3  . losartan (COZAAR) 100 MG tablet Take 1 tablet (100 mg total) by mouth daily. Please note change in tablet dose. 90 tablet 3  . metFORMIN (GLUCOPHAGE-XR) 500 MG 24 hr tablet TAKE 1 TABLET BY MOUTH EVERY DAY WITH BREAKFAST 90 tablet 1  . pravastatin (PRAVACHOL) 20 MG tablet TAKE 1 TABLET BY MOUTH EVERY DAY IN THE EVENING 90 tablet 3  . XTANDI 40 MG capsule TAKE 4 CAPSULES (160 MG TOTAL) BY MOUTH DAILY.  120 capsule 2   No current facility-administered medications for this visit.      Allergies:  Allergies  Allergen Reactions  . Aspirin Nausea Only    Patient stated,"I get nauseated with higher doses of Aspirin."    Past Medical History, Surgical history, Social history, and Family History no changes noted by my review.  Physical Exam:  Blood pressure 136/73, pulse 84, temperature 98 F (36.7 C), temperature source Oral, resp. rate 18, height 5\' 10"  (1.778 m), weight 234 lb 3.2 oz (106.2 kg), SpO2 100 %.      ECOG:  1     General appearance: Alert, awake without any distress. Head: Atraumatic without abnormalities Oropharynx: Without any thrush or ulcers. Eyes: No scleral icterus. Lymph nodes: No lymphadenopathy noted in the cervical, supraclavicular, or axillary nodes Heart:regular rate and rhythm, without any murmurs or gallops.   Lung: Clear to auscultation without any rhonchi, wheezes or dullness to percussion. Abdomin: Soft, nontender without any shifting dullness or ascites. Musculoskeletal: No clubbing or cyanosis. Neurological: No motor or sensory deficits. Skin: No rashes or lesions. Psychiatric: Mood and affect appeared normal.        Lab Results: Lab Results  Component Value Date   WBC 6.4 02/16/2019   HGB 14.1 02/16/2019   HCT 40.7 02/16/2019   MCV 89.1 02/16/2019   PLT 199 02/16/2019     Chemistry      Component Value Date/Time   NA 139 02/16/2019 1113   NA 141 08/12/2017 1258   K 3.4 (L) 02/16/2019 1113   K 3.4 (L) 08/12/2017 1258   CL 104 02/16/2019 1113   CO2 24 02/16/2019 1113   CO2 27 08/12/2017 1258   BUN 15 02/16/2019 1113   BUN 14.9 08/12/2017 1258   CREATININE 1.15 02/16/2019 1113   CREATININE 1.0 08/12/2017 1258      Component Value Date/Time   CALCIUM 9.5 02/16/2019 1113   CALCIUM 9.5 08/12/2017 1258   ALKPHOS 64 02/16/2019 1113   ALKPHOS 62 08/12/2017 1258   AST 28 02/16/2019 1113   AST 36 (H) 08/12/2017 1258   ALT 30 02/16/2019 1113   ALT 43 08/12/2017 1258   BILITOT 0.7 02/16/2019 1113   BILITOT 0.34 08/12/2017 1258        Results for RAIF, CHACHERE I (MRN [id number]) as of 02/22/2019 09:50  Ref. Range 12/15/2018 09:08 02/16/2019 11:13  Prostate Specific Ag, Serum Latest Ref Range: 0.0 - 4.0 ng/mL 15.3 (H) 19.2 (H)       Impression and Plan:   83 year old man with:  1.  Advanced prostate cancer with lymphadenopathy that is currently castration-resistant.    He remains on Xtandi with very mild decrease in his PSA at this time.   CT scan and bone scan obtained in May 2020 were reviewed again and no rheumatic changes at this time.  Risks and benefits of continuing this medication was reviewed today and is agreeable to continue.  Alternative regimens including chemotherapy as well as Xofigo were reviewed.   2. Androgen deprivation therapy: He will continue on androgen deprivation indefinitely.  He is currently receiving that under the care of Dr. Diona Fanti.  3. Bone directed therapy: I recommended continuing calcium and vitamin D supplements.  4.  Prognosis and goals of care: Therapy remains palliative although aggressive measures are warranted at this time.  His performance status remain excellent.  5. Follow-up: Will be in 2 months for repeat evaluation.  25  minutes was spent with the patient face-to-face today.  More than 50% of time was spent on reviewing his disease status, answering questions regarding future plan of care and discussing alternative treatment plans.      Zola Button, MD 7/15/202010:56 AM

## 2019-02-22 NOTE — Telephone Encounter (Signed)
Scheduled appt per 7/15 los. ° °Printed calendar and avs. ° ° °

## 2019-02-24 NOTE — Addendum Note (Signed)
Addended by: Hulan Fray on: 02/24/2019 07:30 AM   Modules accepted: Orders

## 2019-03-21 MED FILL — XTANDI 40 MG CAPSULE: 40 | 30 days supply | Qty: 120 | Fill #2

## 2019-04-10 ENCOUNTER — Other Ambulatory Visit: Payer: Self-pay | Admitting: Oncology

## 2019-04-10 DIAGNOSIS — C7951 Secondary malignant neoplasm of bone: Secondary | ICD-10-CM

## 2019-04-10 DIAGNOSIS — C61 Malignant neoplasm of prostate: Secondary | ICD-10-CM

## 2019-04-12 ENCOUNTER — Other Ambulatory Visit: Payer: Self-pay | Admitting: Student in an Organized Health Care Education/Training Program

## 2019-04-12 DIAGNOSIS — I10 Essential (primary) hypertension: Secondary | ICD-10-CM

## 2019-04-13 MED FILL — XTANDI 40 MG CAPSULE: 40 | 30 days supply | Qty: 120 | Fill #0

## 2019-04-25 ENCOUNTER — Other Ambulatory Visit: Payer: Self-pay

## 2019-04-25 ENCOUNTER — Inpatient Hospital Stay: Payer: Medicare Other | Attending: Oncology

## 2019-04-25 DIAGNOSIS — I10 Essential (primary) hypertension: Secondary | ICD-10-CM | POA: Diagnosis not present

## 2019-04-25 DIAGNOSIS — C61 Malignant neoplasm of prostate: Secondary | ICD-10-CM | POA: Insufficient documentation

## 2019-04-25 DIAGNOSIS — E291 Testicular hypofunction: Secondary | ICD-10-CM | POA: Insufficient documentation

## 2019-04-25 LAB — CMP (CANCER CENTER ONLY)
ALT: 23 U/L (ref 0–44)
AST: 20 U/L (ref 15–41)
Albumin: 3.8 g/dL (ref 3.5–5.0)
Alkaline Phosphatase: 76 U/L (ref 38–126)
Anion gap: 10 (ref 5–15)
BUN: 14 mg/dL (ref 8–23)
CO2: 24 mmol/L (ref 22–32)
Calcium: 9.4 mg/dL (ref 8.9–10.3)
Chloride: 103 mmol/L (ref 98–111)
Creatinine: 1 mg/dL (ref 0.61–1.24)
GFR, Est AFR Am: >60 mL/min (ref >60)
GFR, Estimated: >60 mL/min (ref >60)
Glucose, Bld: 118 mg/dL — ABNORMAL HIGH (ref 70–99)
Potassium: 3.3 mmol/L — ABNORMAL LOW (ref 3.5–5.1)
Sodium: 137 mmol/L (ref 135–145)
Total Bilirubin: 0.6 mg/dL (ref 0.3–1.2)
Total Protein: 8.2 g/dL — ABNORMAL HIGH (ref 6.5–8.1)

## 2019-04-25 LAB — CBC WITH DIFFERENTIAL (CANCER CENTER ONLY)
Abs Immature Granulocytes: 0.01 10*3/uL (ref 0.00–0.07)
Basophils Absolute: 0 10*3/uL (ref 0.0–0.1)
Basophils Relative: 0 %
Eosinophils Absolute: 0.1 10*3/uL (ref 0.0–0.5)
Eosinophils Relative: 1 %
HCT: 39.3 % (ref 39.0–52.0)
Hemoglobin: 13.5 g/dL (ref 13.0–17.0)
Immature Granulocytes: 0 %
Lymphocytes Relative: 41 %
Lymphs Abs: 3.1 10*3/uL (ref 0.7–4.0)
MCH: 30.8 pg (ref 26.0–34.0)
MCHC: 34.4 g/dL (ref 30.0–36.0)
MCV: 89.5 fL (ref 80.0–100.0)
Monocytes Absolute: 0.9 10*3/uL (ref 0.1–1.0)
Monocytes Relative: 11 %
Neutro Abs: 3.5 10*3/uL (ref 1.7–7.7)
Neutrophils Relative %: 47 %
Platelet Count: 229 10*3/uL (ref 150–400)
RBC: 4.39 MIL/uL (ref 4.22–5.81)
RDW: 12.5 % (ref 11.5–15.5)
WBC Count: 7.6 10*3/uL (ref 4.0–10.5)
nRBC: 0 % (ref 0.0–0.2)

## 2019-04-26 LAB — PROSTATE-SPECIFIC AG, SERUM (LABCORP): Prostate Specific Ag, Serum: 31.7 ng/mL — ABNORMAL HIGH (ref 0.0–4.0)

## 2019-04-27 ENCOUNTER — Inpatient Hospital Stay (HOSPITAL_BASED_OUTPATIENT_CLINIC_OR_DEPARTMENT_OTHER): Payer: Medicare Other | Admitting: Oncology

## 2019-04-27 ENCOUNTER — Other Ambulatory Visit: Payer: Self-pay

## 2019-04-27 VITALS — BP 149/72 | HR 84 | Temp 98.3°F | Resp 18 | Wt 231.4 lb

## 2019-04-27 DIAGNOSIS — C7951 Secondary malignant neoplasm of bone: Secondary | ICD-10-CM

## 2019-04-27 DIAGNOSIS — C61 Malignant neoplasm of prostate: Secondary | ICD-10-CM | POA: Diagnosis not present

## 2019-04-27 NOTE — Progress Notes (Signed)
Hematology and Oncology Follow Up Visit  [name]:[id number] 24-Sep-1935 83 y.o. 04/27/2019 3:21 PM [name], MDVincent, [name],*   Principle Diagnosis: 83 year old man with advanced prostate cancer with pelvic adenopathy that is currently castration-resistant diagnosed in 2018.  [name] presented in 2009 with Gleason score 4+4 = 8 localized prostate cancer.   Prior Therapy: [name] was treated with androgen deprivation as well as definitive radiation therapy. His PSA nadir was 0.5 in 2009.   [name] developed advanced disease with PSA in February 2015 was up to 36.35 and a fluoride PET scan showed measurable disease in the second rib as well as the 10th thoracic vertebral body. [name] was started on androgen deprivation since that time. [name] had an excellent PSA response initially with a nadir down to 0.08 in April 2016.   His PSA was 0.8 in February 2017 and Casodex was added.   In April 2017 his PSA was 0.28 and started to rise again in July 2017. His PSA was 0.44 and subsequently was 1.09 in October 2017. On 10/09/2016 his PSA was 3.39. His testosterone level was 30.4. Casodex was discontinued in April 2018. His PSA was 7.7 in August 2018.   Current therapy:   Xtandi 160 mg daily started on 04/08/2017.   Androgen deprivation given Dr. Diona Fanti.  Interim History: [name] is here for a follow-up.  Since last visit, [name] reports continuing feeling well without any recent complaints.  [name] does report some mild fatigue associated with Xtandi but no other complaints.  His appetite and performance status remains reasonable.  [name] denies any abdominal pain, distention or weight loss.  [name] denies any other satiety or GI complaints.  [name] denies any bone pain or pathological fractures.  [name] denies any recent hospitalizations or illnesses.  [name] denied any alteration mental status, neuropathy, confusion or dizziness.  Denies any headaches or lethargy.  Denies any night sweats, weight loss or changes  in appetite.  Denied orthopnea, dyspnea on exertion or chest discomfort.  Denies shortness of breath, difficulty breathing hemoptysis or cough.  Denies any abdominal distention, nausea, early satiety or dyspepsia.  Denies any hematuria, frequency, dysuria or nocturia.  Denies any skin irritation, dryness or rash.  Denies any ecchymosis or petechiae.  Denies any lymphadenopathy or clotting.  Denies any heat or cold intolerance.  Denies any anxiety or depression.  Remaining review of system is negative.            Medications: Unchanged on review Current Outpatient Medications  Medication Sig Dispense Refill  . alendronate (FOSAMAX) 70 MG tablet TAKE 1 TABLET WEEKLY    . amLODipine (NORVASC) 10 MG tablet TAKE 1 TABLET (10 MG TOTAL) DAILY BY MOUTH. 90 tablet 3  . chlorthalidone (HYGROTON) 50 MG tablet Take 1 tablet (50 mg total) by mouth daily. 90 tablet 3  . fluticasone (FLONASE) 50 MCG/ACT nasal spray PLACE 1 SPRAY INTO BOTH NOSTRILS DAILY. 16 g 5  . Hypromellose (ARTIFICIAL TEARS OP) Place 1 drop into the left eye daily.    . ILEVRO 0.3 % ophthalmic suspension INSTILL 1 DROP INTO RIGHT EYE EVERY DAY  2  . KLOR-CON M10 10 MEQ tablet TAKE 1 TABLET BY MOUTH EVERY DAY 90 tablet 3  . losartan (COZAAR) 100 MG tablet TAKE 1 TABLET (100 MG TOTAL) BY MOUTH DAILY. PLEASE NOTE CHANGE IN TABLET DOSE. 90 tablet 3  . metFORMIN (GLUCOPHAGE-XR) 500 MG 24 hr tablet TAKE 1 TABLET BY MOUTH EVERY DAY WITH BREAKFAST 90 tablet 1  .  pravastatin (PRAVACHOL) 20 MG tablet TAKE 1 TABLET BY MOUTH EVERY DAY IN THE EVENING 90 tablet 3  . XTANDI 40 MG capsule TAKE 4 CAPSULES (160MG ) BY MOUTH DAILY 120 capsule 2   No current facility-administered medications for this visit.      Allergies:  Allergies  Allergen Reactions  . Aspirin Nausea Only    Patient stated,"I get nauseated with higher doses of Aspirin."    Past Medical History, Surgical history, Social history, and Family History updated without changes.  .  Physical Exam:   Blood pressure (!) 149/72, pulse 84, temperature 98.3 F (36.8 C), temperature source Temporal, resp. rate 18, weight 231 lb 6.4 oz (105 kg), SpO2 98 %.      ECOG: 1    General appearance: Comfortable appearing without any discomfort Head: Normocephalic without any trauma Oropharynx: Mucous membranes are moist and pink without any thrush or ulcers. Eyes: Pupils are equal and round reactive to light. Lymph nodes: No cervical, supraclavicular, inguinal or axillary lymphadenopathy.   Heart:regular rate and rhythm.  S1 and S2 without leg edema. Lung: Clear without any rhonchi or wheezes.  No dullness to percussion. Abdomin: Soft, nontender, nondistended with good bowel sounds.  No hepatosplenomegaly. Musculoskeletal: No joint deformity or effusion.  Full range of motion noted. Neurological: No deficits noted on motor, sensory and deep tendon reflex exam. Skin: No petechial rash or dryness.  Appeared moist.          Lab Results: Lab Results  Component Value Date   WBC 7.6 04/25/2019   HGB 13.5 04/25/2019   HCT 39.3 04/25/2019   MCV 89.5 04/25/2019   PLT 229 04/25/2019     Chemistry      Component Value Date/Time   NA 137 04/25/2019 0809   NA 141 08/12/2017 1258   K 3.3 (L) 04/25/2019 0809   K 3.4 (L) 08/12/2017 1258   CL 103 04/25/2019 0809   CO2 24 04/25/2019 0809   CO2 27 08/12/2017 1258   BUN 14 04/25/2019 0809   BUN 14.9 08/12/2017 1258   CREATININE 1.00 04/25/2019 0809   CREATININE 1.0 08/12/2017 1258      Component Value Date/Time   CALCIUM 9.4 04/25/2019 0809   CALCIUM 9.5 08/12/2017 1258   ALKPHOS 76 04/25/2019 0809   ALKPHOS 62 08/12/2017 1258   AST 20 04/25/2019 0809   AST 36 (H) 08/12/2017 1258   ALT 23 04/25/2019 0809   ALT 43 08/12/2017 1258   BILITOT 0.6 04/25/2019 0809   BILITOT 0.34 08/12/2017 1258           Results for [name], [name] I (MRN [id number]) as of 04/27/2019 15:07  Ref. Range 02/16/2019 11:13  04/25/2019 08:09  Prostate Specific Ag, Serum Latest Ref Range: 0.0 - 4.0 ng/mL 19.2 (H) 31.7 (H)     Impression and Plan:   83 year old man with:  1.  Castration-resistant prostate cancer with lymphadenopathy diagnosed in 2018.      [name] is currently on Xtandi with excellent initial response although his PSA is currently rising with a doubling time of less than 4 months.  CT scan and bone scan obtained in May 2020 were reviewed again which showed mild progression in his lymphadenopathy.  Treatment options were reiterated again including continuing Xtandi, switching to Zytiga, systemic chemotherapy among others.  After discussion today, we opted to continue with Xtandi with consideration to switching to different therapy if his doubling time becomes less than 3 months.   2. Androgen deprivation therapy: I  recommended continuing androgen deprivation which [name] is currently receiving under the care of Dr. Diona Fanti indefinitely.  3. Bone directed therapy: [name] is to take calcium and vitamin D supplements are prevention of skeletal related events.  4.  Prognosis and goals of care: Therapy remains palliative although aggressive measures are warranted given his excellent performance status.  5.  Hypertension: Blood pressure is adequately controlled at this time.  We will continue to monitor on Xtandi.  5. Follow-up: In 2 months for repeat evaluation.  25  minutes was spent with the patient face-to-face today.  More than 50% of time was dedicated to discussing his laboratory testing, treatment options, complication related therapy as well as future plan of care.      Zola Button, MD 9/17/20203:21 PM

## 2019-05-02 ENCOUNTER — Telehealth: Payer: Self-pay | Admitting: Oncology

## 2019-05-02 NOTE — Telephone Encounter (Signed)
Called and spoke with patients wife. Confirmed appts °

## 2019-05-08 ENCOUNTER — Encounter (HOSPITAL_COMMUNITY): Payer: Self-pay

## 2019-05-08 ENCOUNTER — Emergency Department (HOSPITAL_COMMUNITY): Payer: Medicare Other

## 2019-05-08 ENCOUNTER — Emergency Department (HOSPITAL_COMMUNITY)
Admission: EM | Admit: 2019-05-08 | Discharge: 2019-05-08 | Disposition: A | Discharge status: Home / Self Care | Payer: Medicare Other | Attending: Emergency Medicine | Admitting: Emergency Medicine

## 2019-05-08 ENCOUNTER — Other Ambulatory Visit: Payer: Self-pay

## 2019-05-08 ENCOUNTER — Telehealth: Payer: Self-pay | Admitting: *Deleted

## 2019-05-08 DIAGNOSIS — M25512 Pain in left shoulder: Secondary | ICD-10-CM | POA: Diagnosis not present

## 2019-05-08 DIAGNOSIS — Z87891 Personal history of nicotine dependence: Secondary | ICD-10-CM | POA: Insufficient documentation

## 2019-05-08 DIAGNOSIS — R9431 Abnormal electrocardiogram [ECG] [EKG]: Secondary | ICD-10-CM | POA: Insufficient documentation

## 2019-05-08 DIAGNOSIS — Z7984 Long term (current) use of oral hypoglycemic drugs: Secondary | ICD-10-CM | POA: Diagnosis not present

## 2019-05-08 DIAGNOSIS — Z79899 Other long term (current) drug therapy: Secondary | ICD-10-CM | POA: Insufficient documentation

## 2019-05-08 DIAGNOSIS — R0789 Other chest pain: Secondary | ICD-10-CM

## 2019-05-08 DIAGNOSIS — I10 Essential (primary) hypertension: Secondary | ICD-10-CM | POA: Insufficient documentation

## 2019-05-08 DIAGNOSIS — E119 Type 2 diabetes mellitus without complications: Secondary | ICD-10-CM | POA: Insufficient documentation

## 2019-05-08 DIAGNOSIS — E876 Hypokalemia: Secondary | ICD-10-CM | POA: Diagnosis not present

## 2019-05-08 LAB — CBC WITH DIFFERENTIAL/PLATELET
Abs Immature Granulocytes: 0.03 10*3/uL (ref 0.00–0.07)
Basophils Absolute: 0 10*3/uL (ref 0.0–0.1)
Basophils Relative: 0 %
Eosinophils Absolute: 0 10*3/uL (ref 0.0–0.5)
Eosinophils Relative: 0 %
HCT: 38.3 % — ABNORMAL LOW (ref 39.0–52.0)
Hemoglobin: 12.8 g/dL — ABNORMAL LOW (ref 13.0–17.0)
Immature Granulocytes: 0 %
Lymphocytes Relative: 31 %
Lymphs Abs: 2.2 10*3/uL (ref 0.7–4.0)
MCH: 30.5 pg (ref 26.0–34.0)
MCHC: 33.4 g/dL (ref 30.0–36.0)
MCV: 91.4 fL (ref 80.0–100.0)
Monocytes Absolute: 0.9 10*3/uL (ref 0.1–1.0)
Monocytes Relative: 12 %
Neutro Abs: 4 10*3/uL (ref 1.7–7.7)
Neutrophils Relative %: 57 %
Platelets: 270 10*3/uL (ref 150–400)
RBC: 4.19 MIL/uL — ABNORMAL LOW (ref 4.22–5.81)
RDW: 13 % (ref 11.5–15.5)
WBC: 7.1 10*3/uL (ref 4.0–10.5)
nRBC: 0 % (ref 0.0–0.2)

## 2019-05-08 LAB — BASIC METABOLIC PANEL
Anion gap: 12 (ref 5–15)
BUN: 21 mg/dL (ref 8–23)
CO2: 22 mmol/L (ref 22–32)
Calcium: 8.9 mg/dL (ref 8.9–10.3)
Chloride: 102 mmol/L (ref 98–111)
Creatinine, Ser: 1.22 mg/dL (ref 0.61–1.24)
GFR calc Af Amer: >60 mL/min (ref >60)
GFR calc non Af Amer: 55 mL/min — ABNORMAL LOW (ref >60)
Glucose, Bld: 125 mg/dL — ABNORMAL HIGH (ref 70–99)
Potassium: 2.8 mmol/L — ABNORMAL LOW (ref 3.5–5.1)
Sodium: 136 mmol/L (ref 135–145)

## 2019-05-08 LAB — TROPONIN I (HIGH SENSITIVITY)
Troponin I (High Sensitivity): 10 ng/L (ref <18)
Troponin I (High Sensitivity): 10 ng/L (ref <18)

## 2019-05-08 MED ORDER — POTASSIUM CHLORIDE 10 MEQ/100ML IV SOLN
10.0000 meq | Freq: Once | INTRAVENOUS | Status: AC
  Administered 2019-05-08: 10 meq via INTRAVENOUS
  Filled 2019-05-08: qty 100

## 2019-05-08 MED ORDER — LIDOCAINE 5 % EX PTCH: 1.0000 | MEDICATED_PATCH | CUTANEOUS | 0 refills | Status: DC

## 2019-05-08 MED ORDER — POTASSIUM CHLORIDE ER 10 MEQ PO TBCR: 10.0000 meq | EXTENDED_RELEASE_TABLET | Freq: Every day | ORAL | 0 refills | Status: DC

## 2019-05-08 MED ORDER — POTASSIUM CHLORIDE CRYS ER 20 MEQ PO TBCR
40.0000 meq | EXTENDED_RELEASE_TABLET | Freq: Once | ORAL | Status: AC
  Administered 2019-05-08: 40 meq via ORAL
  Filled 2019-05-08: qty 2

## 2019-05-08 MED ORDER — HYDROCODONE-ACETAMINOPHEN 5-325 MG PO TABS: 1.0000 | ORAL_TABLET | Freq: Four times a day (QID) | ORAL | 0 refills | Status: DC | PRN

## 2019-05-08 MED ORDER — HYDROCODONE-ACETAMINOPHEN 5-325 MG PO TABS
1.0000 | ORAL_TABLET | Freq: Once | ORAL | Status: AC
  Administered 2019-05-08: 1 via ORAL
  Filled 2019-05-08: qty 1

## 2019-05-08 NOTE — ED Provider Notes (Signed)
Received sign out from [facility], [place].  L shoulder pain x 1 week, likely MSK.  However, will obtain delta trop prior to discharge.    Likely d/c with small amount of hydrocodone and sling.    9:58 PM Negative delta trop.  Pain of L shoulder is reproducible consistent with MSK.  Dr. Roslynn Amble have also evaluated pt and felt he is stable for discharge.  Ortho referral given.  Encourage pt to f/u with PCP as well.  K+ supplementation given.   BP 120/65 (BP Location: Right Arm)   Pulse 75   Temp 98.8 F (37.1 C) (Oral)   Resp 20   SpO2 100%   Results for orders placed or performed during the hospital encounter of 0000000  Basic metabolic panel  Result Value Ref Range   Sodium 136 135 - 145 mmol/L   Potassium 2.8 (L) 3.5 - 5.1 mmol/L   Chloride 102 98 - 111 mmol/L   CO2 22 22 - 32 mmol/L   Glucose, Bld 125 (H) 70 - 99 mg/dL   BUN 21 8 - 23 mg/dL   Creatinine, Ser 1.22 0.61 - 1.24 mg/dL   Calcium 8.9 8.9 - 10.3 mg/dL   GFR calc non Af Amer 55 (L) >60 mL/min   GFR calc Af Amer >60 >60 mL/min   Anion gap 12 5 - 15  CBC with Differential  Result Value Ref Range   WBC 7.1 4.0 - 10.5 K/uL   RBC 4.19 (L) 4.22 - 5.81 MIL/uL   Hemoglobin 12.8 (L) 13.0 - 17.0 g/dL   HCT 38.3 (L) 39.0 - 52.0 %   MCV 91.4 80.0 - 100.0 fL   MCH 30.5 26.0 - 34.0 pg   MCHC 33.4 30.0 - 36.0 g/dL   RDW 13.0 11.5 - 15.5 %   Platelets 270 150 - 400 K/uL   nRBC 0.0 0.0 - 0.2 %   Neutrophils Relative % 57 %   Neutro Abs 4.0 1.7 - 7.7 K/uL   Lymphocytes Relative 31 %   Lymphs Abs 2.2 0.7 - 4.0 K/uL   Monocytes Relative 12 %   Monocytes Absolute 0.9 0.1 - 1.0 K/uL   Eosinophils Relative 0 %   Eosinophils Absolute 0.0 0.0 - 0.5 K/uL   Basophils Relative 0 %   Basophils Absolute 0.0 0.0 - 0.1 K/uL   Immature Granulocytes 0 %   Abs Immature Granulocytes 0.03 0.00 - 0.07 K/uL  Troponin I (High Sensitivity)  Result Value Ref Range   Troponin I (High Sensitivity) 10 <18 ng/L  Troponin I (High Sensitivity)   Result Value Ref Range   Troponin I (High Sensitivity) 10 <18 ng/L   Dg Chest 2 View  Result Date: 05/08/2019 CLINICAL DATA:  Chest pain EXAM: CHEST - 2 VIEW COMPARISON:  05/15/2011, FINDINGS: No focal opacity or pleural effusion. Normal cardiomediastinal silhouette. No pneumothorax. IMPRESSION: No active cardiopulmonary disease. Electronically Signed   By: Donavan Foil M.D.   On: 05/08/2019 18:42   Dg Shoulder Left  Result Date: 05/08/2019 CLINICAL DATA:  Shoulder pain EXAM: LEFT SHOULDER - 2+ VIEW COMPARISON:  None. FINDINGS: No fracture or malalignment. Advanced degenerative change of the [facility] joint. Moderate glenohumeral degenerative change. Faint calcification along the superolateral aspect of the humeral head. IMPRESSION: 1. No acute osseous abnormality. 2. 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 Mild calcific tendinitis Electronically Signed   By: Donavan Foil M.D.   On: 05/08/2019 18:41  Domenic Moras, PA-C 05/08/19 2201    Daleen Bo, MD 05/09/19 1017

## 2019-05-08 NOTE — Discharge Instructions (Addendum)
Your x-rays today showed arthritis in your left shoulder.  You can start taking hydrocodone as needed for severe pain but do not drive, drink alcohol, operate heavy machinery while taking this medication as it can cause drowsiness.  I would recommend taking this medication only at home when you have family around to make sure that you do not put yourself at increased risk of falls.  Alternatively you can take 1 to 2 tablets of extra strength Tylenol every 6 hours as needed for pain.  Apply lidocaine patches to areas of pain.  You can wear the shoulder sling as needed for pain but try not to wear it all the time because this can cause worsening muscle stiffness and soreness.  Do the attached exercises at least 3 times weekly if not daily.  Your potassium levels were very low today.  We replenished this orally and with some IV potassium as well.  Take potassium supplements once daily for the next 5 days  Your EKG today was a little different than it has been previously, but we found no evidence of heart attack on your workup today. Please follow up with your PCP or cardiologist for re-evaluation.    Follow-up with an orthopedist will be very important.  Call to make an appointment.  Return to the emergency department if any concerning signs or symptoms develop such as worsening pain, severe swelling, loss of pulses, chest pains, or loss of consciousness.

## 2019-05-08 NOTE — Telephone Encounter (Signed)
Pt's daughter calls and states father cannot stand alone today, is dizzy, having L arm and shoulder pain radiating from chest, vision blurry. She is advised to take him to ED or call 911. She is agreeable.

## 2019-05-08 NOTE — ED Triage Notes (Signed)
The patient is from home and complains of left shoulder pain. Pain started 2 weeks ago and has been getting worse over the last 2 weeks. Patient describes the pain as achy and stiff. He complains of decreased range of motion.   EMS vitals: 105/68 BP 103 HR 99% O2 sat on room air

## 2019-05-08 NOTE — Telephone Encounter (Signed)
Thank you. I agree 

## 2019-05-08 NOTE — ED Provider Notes (Signed)
[facility] DEPT Provider Note   CSN: [id number] Arrival date & time: 05/08/19  1615     History   Chief Complaint Chief Complaint  Patient presents with  . Shoulder Pain    HPI [name] is a 83 y.o. male with history of GERD, hypertension, hyperlipidemia, OSA on CPAP, type 2 diabetes mellitus presents today for evaluation of acute onset, persistent left shoulder pain for 1 week.  Denies any known history of trauma.  Reports he had similar symptoms in the right shoulder but has "been acting up" on the left.  Reports pain is achy, radiates down the left upper extremity, worsens with certain movements of the left upper extremity and feels as though he is decreased range of motion especially with abduction and flexion.  Reports that at times the pain will feel as though it is radiating into the left side of the chest.  He denies any associated shortness of breath, nausea, vomiting, abdominal pain, diaphoresis, fevers, or lightheadedness.  He does note some intermittent numbness and tingling of his left fingertips.  He has been trying Tylenol without relief of his symptoms.  There is a telephone encounter from the PCPs office that his daughter called today stating that her father could not stand alone and was dizzy with blurred vision and left-sided chest pain but he states that he did not feel dizzy and "she doesn't know what she's talking about".      The history is provided by the patient.    Past Medical History:  Diagnosis Date  . Adenomatous colon polyp 12/14/2005   5 mm polyp in descending colon, found on colonoscopy by Dr. Wilford Corner  . Allergic rhinitis   . Asymptomatic cholelithiasis 11/30/2014  . Bladder calculi   . Bladder cancer ([place]) 2010   S/P TURBT   . Bladder neck contracture   . Enlarged heart    "slight" (09/09/2016)  . GERD (gastroesophageal reflux disease)   . Hand eczema   . Hearing loss 11/30/2016  . Hepatitis,  unspecified PT STATES HAD HEPATITIS APPROX 1980'S ; UNSURE WHAT TYPE   Hepatitis C RNA Quant on July 06, 2007 showed no detectable virus.  . High cholesterol   . History of kidney stones   . Hypertension   . Nocturia   . OSA on CPAP    Severe by diagnostic polysomnogram 06/22/2004.  Marland Kitchen Prostate cancer ([place]) 2009   S/P EXTERNAL RADIATION, SEED IMPLANTS  . Self-catheterizes urinary bladder    "q couple weeks prn" (09/09/2016)  . Sleep apnea    "don't use the mask anymore" (09/09/2016)    Patient Active Problem List   Diagnosis Date Noted  . Arthritis of both hands 01/16/2019  . Hallux valgus of right foot 01/16/2019  . Urinary incontinence 01/16/2019  . Sensorineural hearing loss (SNHL) of both ears 06/28/2017  . Type 2 diabetes mellitus with other specified complication ([place]) 0000000  . Hyperlipidemia 05/20/2015  . 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 Obstructive sleep apnea 07/06/2007  . Essential hypertension 07/09/2006  . Allergic rhinitis 07/09/2006  . Osteoarthritis of left knee 07/09/2006    Past Surgical History:  Procedure Laterality Date  . CARDIOVASCULAR STRESS TEST  07-14-2011  DR NISHAN   NORMAL NUCLEAR STUDY/ EF 62%  . CIRCUMCISION N/A 04/08/2015   Procedure: CIRCUMCISION ADULT;  Surgeon: Franchot Gallo, MD;  Location: [facility];  Service: Urology;  Laterality: N/A;  . CYSTOSCOPY WITH LITHOLAPAXY N/A 10/24/2012   Procedure:  CYSTOSCOPY WITH LITHOLAPAXY;  Surgeon: Franchot Gallo, MD;  Location: [facility];  Service: Urology;  Laterality: N/A;  TRANSURETHRAL RESECTION OF BLADDER NECK CONTRACTURE WITH COLLINS KNIFE     . CYSTOSCOPY WITH URETHRAL DILATATION N/A 04/08/2015   Procedure: CYSTOSCOPY WITH BALLOON DILATATION OF BLADDER NECK CONTRACTURE;  Surgeon: Franchot Gallo, MD;  Location: [facility];  Service: Urology;  Laterality: N/A;  . KNEE ARTHROSCOPY Bilateral 1970's  . RADIOACTIVE PROSTATE SEED IMPLANTS  04-11-2008  .  TRANSURETHRAL RESECTION OF BLADDER NECK  12-19-2008  . TRANSURETHRAL RESECTION OF BLADDER TUMOR  02-19-2009   AND TUR BLADDER NECK        Home Medications    Prior to Admission medications   Medication Sig Start Date End Date Taking? Authorizing Provider  alendronate (FOSAMAX) 70 MG tablet Take 70 mg by mouth once a week.  12/05/18  Yes [provider]  amLODipine (NORVASC) 10 MG tablet TAKE 1 TABLET (10 MG TOTAL) DAILY BY MOUTH. 08/22/18  Yes [name], MD  chlorthalidone (HYGROTON) 50 MG tablet Take 1 tablet (50 mg total) by mouth daily. 10/12/18  Yes [name], MD  Hypromellose (ARTIFICIAL TEARS OP) Place 1 drop into the left eye daily.   Yes [provider]  ILEVRO 0.3 % ophthalmic suspension Place 1 drop into the right eye daily.  01/21/17  Yes [provider]  KLOR-CON M10 10 MEQ tablet TAKE 1 TABLET BY MOUTH EVERY DAY Patient taking differently: Take 10 mEq by mouth daily.  12/15/18  Yes Annia Belt, MD  losartan (COZAAR) 100 MG tablet TAKE 1 TABLET (100 MG TOTAL) BY MOUTH DAILY. PLEASE NOTE CHANGE IN TABLET DOSE. 04/12/19  Yes [name], MD  metFORMIN (GLUCOPHAGE-XR) 500 MG 24 hr tablet TAKE 1 TABLET BY MOUTH EVERY DAY WITH BREAKFAST Patient taking differently: Take 500 mg by mouth daily with breakfast.  12/08/18  Yes Aldine Contes, MD  pravastatin (PRAVACHOL) 20 MG tablet TAKE 1 TABLET BY MOUTH EVERY DAY IN THE EVENING Patient taking differently: Take 20 mg by mouth daily.  12/12/18  Yes Aldine Contes, MD  XTANDI 40 MG capsule TAKE 4 CAPSULES (160MG ) BY MOUTH DAILY Patient taking differently: Take 160 mg by mouth daily.  04/10/19  Yes Wyatt Portela, MD  fluticasone (FLONASE) 50 MCG/ACT nasal spray PLACE 1 SPRAY INTO BOTH NOSTRILS DAILY. 03/21/18 03/21/19  [name], MD  lidocaine (LIDODERM) 5 % Place 1 patch onto the skin daily. Remove & Discard patch within 12 hours or as directed by MD 05/08/19    Rodell Perna A, PA-C  potassium chloride (K-DUR) 10 MEQ tablet Take 1 tablet (10 mEq total) by mouth daily for 5 days. 05/08/19 05/13/19  Renita Papa, PA-C    Family History Family History  Problem Relation Age of Onset  . Heart attack Mother 41  . Cancer Neg Hx   . Diabetes Neg Hx     Social History Social History   Tobacco Use  . Smoking status: Former Smoker    Packs/day: 0.25    Years: 25.00    Pack years: 6.25    Types: Cigarettes    Quit date: 11/25/2004    Years since quitting: 14.4  . Smokeless tobacco: Never Used  Substance Use Topics  . Alcohol use: No  . Drug use: No     Allergies   Aspirin   Review of Systems Review of Systems  Constitutional: Negative for chills, diaphoresis and fever.  Respiratory: Negative  for shortness of breath.   Cardiovascular: Positive for chest pain.  Gastrointestinal: Negative for abdominal pain, nausea and vomiting.  Musculoskeletal: Positive for arthralgias. Negative for neck pain.  Neurological: Positive for numbness. Negative for syncope and weakness.  All other systems reviewed and are negative.    Physical Exam Updated Vital Signs BP 120/65 (BP Location: Right Arm)   Pulse 75   Temp 98.8 F (37.1 C) (Oral)   Resp 20   SpO2 100%   Physical Exam Vitals signs and nursing note reviewed.  Constitutional:      General: He is not in acute distress.    Appearance: He is well-developed.  HENT:     Head: Normocephalic and atraumatic.  Eyes:     General:        Right eye: No discharge.        Left eye: No discharge.     Conjunctiva/sclera: Conjunctivae normal.  Neck:     Musculoskeletal: Neck supple.     Vascular: No JVD.     Trachea: No tracheal deviation.     Comments: No midline cervical spine tenderness, left paracervical muscle tenderness in the trapezius distribution mostly laterally Cardiovascular:     Rate and Rhythm: Normal rate and regular rhythm.     Pulses: Normal pulses.     Comments: 2+ radial  pulses bilaterally Pulmonary:     Effort: Pulmonary effort is normal.     Breath sounds: Normal breath sounds.  Chest:     Chest wall: No tenderness.  Abdominal:     General: Bowel sounds are normal. There is no distension.     Palpations: Abdomen is soft.     Tenderness: There is no abdominal tenderness. There is no guarding or rebound.  Musculoskeletal:        General: Tenderness present.     Left shoulder: He exhibits decreased range of motion, tenderness and pain. He exhibits no swelling, no deformity and normal pulse.     Comments: Tenderness to palpation along the left acromioclavicular joint and left bicipital tendon groove.  He has limited range of motion with abduction and flexion, positive arc of pain on examination.  Positive empty can sign, positive Hawkins impingement test.  No erythema, swelling, or warmth to the joint.  5/5 strength of BUE major muscle groups although he does need significant encouragement and examination is somewhat limited due to pain.  Skin:    General: Skin is warm and dry.     Findings: No erythema.  Neurological:     Mental Status: He is alert.     Comments: Sensation intact to light touch of bilateral upper extremities.  Good grip strength bilaterally.  Psychiatric:        Behavior: Behavior normal.      ED Treatments / Results  Labs (all labs ordered are listed, but only abnormal results are displayed) Labs Reviewed  BASIC METABOLIC PANEL - Abnormal; Notable for the following components:      Result Value   Potassium 2.8 (*)    Glucose, Bld 125 (*)    GFR calc non Af Amer 55 (*)    All other components within normal limits  CBC WITH DIFFERENTIAL/PLATELET - Abnormal; Notable for the following components:   RBC 4.19 (*)    Hemoglobin 12.8 (*)    HCT 38.3 (*)    All other components within normal limits  TROPONIN I (HIGH SENSITIVITY)  TROPONIN I (HIGH SENSITIVITY)    EKG EKG Interpretation  Date/Time:  Monday May 08 2019  17:52:00 EDT Ventricular Rate:  82 PR Interval:  156 QRS Duration: 138 QT Interval:  462 QTC Calculation: 539 R Axis:   -61 Text Interpretation:  Normal sinus rhythm Right bundle branch block Left anterior fascicular block ** Bifascicular block ** Left ventricular hypertrophy with QRS widening and repolarization abnormality No acute STEMI Confirmed by Madalyn Rob [phone]) on 05/08/2019 7:34:20 PM   Radiology Dg Chest 2 View  Result Date: 05/08/2019 CLINICAL DATA:  Chest pain EXAM: CHEST - 2 VIEW COMPARISON:  05/15/2011, FINDINGS: No focal opacity or pleural effusion. Normal cardiomediastinal silhouette. No pneumothorax. IMPRESSION: No active cardiopulmonary disease. Electronically Signed   By: Donavan Foil M.D.   On: 05/08/2019 18:42   Dg Shoulder Left  Result Date: 05/08/2019 CLINICAL DATA:  Shoulder pain EXAM: LEFT SHOULDER - 2+ VIEW COMPARISON:  None. FINDINGS: No fracture or malalignment. Advanced degenerative change of the [facility] joint. Moderate glenohumeral degenerative change. Faint calcification along the superolateral aspect of the humeral head. IMPRESSION: 1. No acute osseous abnormality. 2. Advanced arthritis of the [facility] joint with moderate degenerative change of the glenohumeral joint 3. Mild calcific tendinitis Electronically Signed   By: Donavan Foil M.D.   On: 05/08/2019 18:41    Procedures Procedures (including critical care time)  Medications Ordered in ED Medications  potassium chloride SA (K-DUR) CR tablet 40 mEq (40 mEq Oral Given 05/08/19 2001)  potassium chloride 10 mEq in 100 mL IVPB (10 mEq Intravenous New Bag/Given 05/08/19 2004)  HYDROcodone-acetaminophen (NORCO/VICODIN) 5-325 MG per tablet 1 tablet (1 tablet Oral Given 05/08/19 2001)     Initial Impression / Assessment and Plan / ED Course  I have reviewed the triage vital signs and the nursing notes.  Pertinent labs & imaging results that were available during my care of the patient were reviewed by me and  considered in my medical decision making (see chart for details).        Patient presenting today for evaluation of atraumatic left shoulder pain.  He is afebrile, vital signs are stable.  He is nontoxic in appearance.  Overwhelmingly his history and physical examination suggests musculoskeletal pain with differential considerations including rotator cuff tendinopathy/tear, adhesive capsulitis, or osteoarthritis.  Low suspicion of septic arthritis in the absence of any constitutional symptoms or warmth.  Doubt gout or osteomyelitis.  Some aspects of his history include pain radiating to the left side of the chest and his daughter reportedly had some concern that he was having some chest pain today as she called his PCP who recommended presentation to the ED for further evaluation.  With a HEART score of 6, we will obtain an EKG and serial troponins to rule out ACS/MI.  EKG today shows new right bundle branch block but no acute ischemic abnormalities/ST segment elevation.  Lab work today reviewed by me shows no leukocytosis, mild anemia, hypokalemia with potassium of 2.8.  No renal insufficiency.  We will give p.o. potassium and one run of IV potassium and discharge the patient with a short course of potassium supplementation.  Radiographs today show no acute cardiopulmonary abnormalities but radiographs of the left shoulder show advanced arthritis of the [facility] joint with moderate degenerative changes of the glenohumeral joint and mild calcific tendinitis.   8:00PM Signed out to oncoming provider PA Rona Ravens.  Initial troponin is negative.  Patient awaiting second troponin.  Overall the patient symptoms are atypical for cardiac pathology.  Doubt dissection, cardiac tamponade, esophageal rupture, pneumothorax, pneumonia, or acute CHF  exacerbation.  If no significant difference in the patient's second troponin then he will likely be stable for discharge home with follow-up with PCP or cardiologist for  reevaluation of his abnormal EKG and follow-up with orthopedist for reevaluation of his left shoulder pain.  We will give him a sling to use for comfort.  We have already discussed conservative therapy and management with Tylenol, physical therapy exercises, gentle stretching.  I discussed strict ED return precautions with the patient and he is verbalized understanding of and agreement with tentative plan.  Discussed with Dr. Roslynn Amble who agrees with assessment and plan at this time.  Final Clinical Impressions(s) / ED Diagnoses   Final diagnoses:  Acute pain of left shoulder  Atypical chest pain  Hypokalemia  Abnormal EKG    ED Discharge Orders         Ordered    lidocaine (LIDODERM) 5 %  Every 24 hours     05/08/19 2029    potassium chloride (K-DUR) 10 MEQ tablet  Daily     05/08/19 2030           Debroah Baller 05/08/19 2113    Lucrezia Starch, MD 05/14/19 [phone]

## 2019-05-12 MED FILL — XTANDI 40 MG CAPSULE: 40 | 30 days supply | Qty: 120 | Fill #1

## 2019-05-15 ENCOUNTER — Ambulatory Visit: Payer: Medicare Other | Admitting: Student in an Organized Health Care Education/Training Program

## 2019-05-22 ENCOUNTER — Ambulatory Visit (INDEPENDENT_AMBULATORY_CARE_PROVIDER_SITE_OTHER): Payer: Medicare Other | Admitting: Student in an Organized Health Care Education/Training Program

## 2019-05-22 ENCOUNTER — Encounter: Payer: Self-pay | Admitting: Student in an Organized Health Care Education/Training Program

## 2019-05-22 VITALS — BP 132/72 | HR 104 | Temp 97.7°F | Ht 70.0 in | Wt 222.7 lb

## 2019-05-22 DIAGNOSIS — Z79899 Other long term (current) drug therapy: Secondary | ICD-10-CM

## 2019-05-22 DIAGNOSIS — M25512 Pain in left shoulder: Secondary | ICD-10-CM

## 2019-05-22 DIAGNOSIS — I10 Essential (primary) hypertension: Secondary | ICD-10-CM | POA: Diagnosis not present

## 2019-05-22 DIAGNOSIS — Z23 Encounter for immunization: Secondary | ICD-10-CM

## 2019-05-22 DIAGNOSIS — R634 Abnormal weight loss: Secondary | ICD-10-CM | POA: Insufficient documentation

## 2019-05-22 DIAGNOSIS — Z6831 Body mass index (BMI) 31.0-31.9, adult: Secondary | ICD-10-CM

## 2019-05-22 DIAGNOSIS — C61 Malignant neoplasm of prostate: Secondary | ICD-10-CM

## 2019-05-22 DIAGNOSIS — E876 Hypokalemia: Secondary | ICD-10-CM | POA: Diagnosis not present

## 2019-05-22 MED ORDER — ZOSTER VAC RECOMB ADJUVANTED 50 MCG/0.5ML IM SUSR: 0.5000 mL | Freq: Once | INTRAMUSCULAR | 0 refills | Status: AC

## 2019-05-22 MED ORDER — POTASSIUM CHLORIDE ER 10 MEQ PO TBCR: 10.0000 meq | EXTENDED_RELEASE_TABLET | Freq: Every day | ORAL | 3 refills | Status: DC

## 2019-05-22 NOTE — Assessment & Plan Note (Signed)
2 weeks of left shoulder pain, acute, improving some.  He has impingement syndrome on exam.  Pain with external rotation, most consistent with a infraspinatus tendinopathy.  No signs of tendon tear.  No joint effusion.  I think this is most likely going to be a self-limited rotator cuff tendinitis, will treat conservatively for another few weeks.  I did consider if his prostate cancer could be playing a role here.  He has a history of bony metastasis, but other signs point to the prostate cancer being under good control right now on Xtandi.  He has no neck or back pain.  If that changes, would consider MRI imaging of cervical and thoracic spine.

## 2019-05-22 NOTE — Patient Instructions (Signed)
It was a pleasure seeing you today.  We talked about your left shoulder pain.  I think it is most likely due to rotator cuff tendinitis.  It should improve over the next few weeks.  You can take ibuprofen 600 mg twice daily to help with the pain.  Your blood pressure is under good control.  Continue taking your medications as prescribed.  I refilled the potassium tablets, your potassium level was low on your blood work from the emergency department.  We talked about your weight loss.  You have lost 15 pounds over the last 1 year.  I think this is still within a healthy range.  Your BMI is 32.  We will continue to monitor your weight every few months.  I given you a paper prescription for a shingles vaccine, you can take this to your pharmacy.  They will send [place] a record once you receive that vaccine.  Got your flu shot today.  Follow-up with me in 3 months.  I want to follow-up on your shoulder, your weight, and check on your blood sugars.

## 2019-05-22 NOTE — Progress Notes (Signed)
   Assessment and Plan:  See Encounters tab for problem-based medical decision making.   __________________________________________________________  HPI:   83 year old man here for follow-up of hypertension.  He has an acute complaint of left shoulder pain.  Present for about 2 weeks.  Initially radiated across his chest, evaluated in the emergency department, ruled out for acute coronary syndrome and discharged with hydrocodone for pain control.  Reports that the shoulder pain is slowly improving over the last few days.  Mostly bothers him at night.  Cannot remember an inciting factor.  No recent traumas.  Denies neck or back pain.  No paresthesias in the hands.  No weakness.  Has trouble lifting his arm over his head.  Otherwise reports doing fairly well.  Compliant with medications without side effects.  No recent fevers or chills.  No dyspnea on exertion.  __________________________________________________________  Problem List: Patient Active Problem List   Diagnosis Date Noted  . 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 Essential hypertension 07/09/2006    Priority: High  . Urinary incontinence 01/16/2019    Priority: Medium  . Type 2 diabetes mellitus with other specified complication ([place]) 0000000    Priority: Medium  . Hallux valgus of right foot 01/16/2019    Priority: Low  . Sensorineural hearing loss (SNHL) of both ears 06/28/2017    Priority: Low  . Hyperlipidemia 05/20/2015    Priority: Low  . Obstructive sleep apnea 07/06/2007    Priority: Low  . Allergic rhinitis 07/09/2006    Priority: Low  . Osteoarthritis of left knee 07/09/2006    Priority: Low  . Left shoulder pain 05/22/2019  . Weight loss 05/22/2019    Medications: Reconciled today in Epic __________________________________________________________  Physical Exam:  Vital Signs: Vitals:   05/22/19 0858  BP: 132/72  Pulse: (!) 104  Temp: 97.7 F (36.5 C)   TempSrc: Oral  SpO2: 100%  Weight: 222 lb 11.2 oz (101 kg)  Height: 5\' 10"  (1.778 m)    Gen: Well appearing, NAD Neck: No cervical LAD, No thyromegaly or nodules, No JVD. CV: RRR, no murmurs Pulm: Normal effort, CTA throughout, no wheezing Ext: Left shoulder with pain on abduction past 90 degrees.  No shoulder effusion.  He has pain with external rotation of the left shoulder, full strength.  No pain with flexed abduction or internal rotation.

## 2019-05-22 NOTE — Assessment & Plan Note (Signed)
Hypertension is well controlled.  Plan to continue with amlodipine 10, chlorthalidone 50 mg, and losartan 100 mg daily.  Hypertension is complicated by chronic hypokalemia, redemonstrated on recent labs in the ED.  No symptoms.  Refilled potassium 10 mEq daily.

## 2019-05-22 NOTE — Assessment & Plan Note (Signed)
15 pound weight loss over the last 1 year.  30 pound weight loss over the last 2 years.  BMI currently 32.  He is trying to lose weight, but feels like this weight loss is faster than normal.  He has had changing his nutrition, daughter is cooking for him.  I think this weight loss is still within a normal range.  He does have prostate cancer, but other signs point to this being well controlled on Xtandi.  I will continue to follow-up with his weight.

## 2019-06-11 ENCOUNTER — Other Ambulatory Visit: Payer: Self-pay | Admitting: Internal Medicine

## 2019-06-11 DIAGNOSIS — E119 Type 2 diabetes mellitus without complications: Secondary | ICD-10-CM

## 2019-06-13 MED FILL — XTANDI 40 MG CAPSULE: 40 | 30 days supply | Qty: 120 | Fill #2

## 2019-06-27 ENCOUNTER — Other Ambulatory Visit: Payer: Medicare Other

## 2019-06-27 ENCOUNTER — Inpatient Hospital Stay: Payer: Medicare Other | Attending: Oncology

## 2019-06-27 ENCOUNTER — Ambulatory Visit: Payer: Medicare Other | Admitting: Oncology

## 2019-06-27 ENCOUNTER — Other Ambulatory Visit: Payer: Self-pay

## 2019-06-27 DIAGNOSIS — I10 Essential (primary) hypertension: Secondary | ICD-10-CM | POA: Insufficient documentation

## 2019-06-27 DIAGNOSIS — E291 Testicular hypofunction: Secondary | ICD-10-CM | POA: Insufficient documentation

## 2019-06-27 DIAGNOSIS — C7951 Secondary malignant neoplasm of bone: Secondary | ICD-10-CM | POA: Diagnosis not present

## 2019-06-27 DIAGNOSIS — C61 Malignant neoplasm of prostate: Secondary | ICD-10-CM | POA: Diagnosis present

## 2019-06-27 LAB — CBC WITH DIFFERENTIAL (CANCER CENTER ONLY)
Abs Immature Granulocytes: 0.01 10*3/uL (ref 0.00–0.07)
Basophils Absolute: 0 10*3/uL (ref 0.0–0.1)
Basophils Relative: 0 %
Eosinophils Absolute: 0.1 10*3/uL (ref 0.0–0.5)
Eosinophils Relative: 1 %
HCT: 42.4 % (ref 39.0–52.0)
Hemoglobin: 14.7 g/dL (ref 13.0–17.0)
Immature Granulocytes: 0 %
Lymphocytes Relative: 52 %
Lymphs Abs: 2.5 10*3/uL (ref 0.7–4.0)
MCH: 30.8 pg (ref 26.0–34.0)
MCHC: 34.7 g/dL (ref 30.0–36.0)
MCV: 88.9 fL (ref 80.0–100.0)
Monocytes Absolute: 0.5 10*3/uL (ref 0.1–1.0)
Monocytes Relative: 10 %
Neutro Abs: 1.8 10*3/uL (ref 1.7–7.7)
Neutrophils Relative %: 37 %
Platelet Count: 281 10*3/uL (ref 150–400)
RBC: 4.77 MIL/uL (ref 4.22–5.81)
RDW: 13.1 % (ref 11.5–15.5)
WBC Count: 4.9 10*3/uL (ref 4.0–10.5)
nRBC: 0 % (ref 0.0–0.2)

## 2019-06-27 LAB — CMP (CANCER CENTER ONLY)
ALT: 25 U/L (ref 0–44)
AST: 23 U/L (ref 15–41)
Albumin: 4 g/dL (ref 3.5–5.0)
Alkaline Phosphatase: 98 U/L (ref 38–126)
Anion gap: 14 (ref 5–15)
BUN: 15 mg/dL (ref 8–23)
CO2: 22 mmol/L (ref 22–32)
Calcium: 9.6 mg/dL (ref 8.9–10.3)
Chloride: 102 mmol/L (ref 98–111)
Creatinine: 1.02 mg/dL (ref 0.61–1.24)
GFR, Est AFR Am: >60 mL/min (ref >60)
GFR, Estimated: >60 mL/min (ref >60)
Glucose, Bld: 94 mg/dL (ref 70–99)
Potassium: 3.6 mmol/L (ref 3.5–5.1)
Sodium: 138 mmol/L (ref 135–145)
Total Bilirubin: 0.4 mg/dL (ref 0.3–1.2)
Total Protein: 8.6 g/dL — ABNORMAL HIGH (ref 6.5–8.1)

## 2019-06-28 LAB — PROSTATE-SPECIFIC AG, SERUM (LABCORP): Prostate Specific Ag, Serum: 54.6 ng/mL — ABNORMAL HIGH (ref 0.0–4.0)

## 2019-06-29 ENCOUNTER — Inpatient Hospital Stay (HOSPITAL_BASED_OUTPATIENT_CLINIC_OR_DEPARTMENT_OTHER): Payer: Medicare Other | Admitting: Oncology

## 2019-06-29 ENCOUNTER — Other Ambulatory Visit: Payer: Self-pay

## 2019-06-29 VITALS — BP 117/67 | HR 77 | Temp 98.2°F | Resp 17 | Ht 70.0 in | Wt 220.5 lb

## 2019-06-29 DIAGNOSIS — C61 Malignant neoplasm of prostate: Secondary | ICD-10-CM

## 2019-06-29 DIAGNOSIS — C7951 Secondary malignant neoplasm of bone: Secondary | ICD-10-CM | POA: Diagnosis not present

## 2019-06-29 NOTE — Progress Notes (Signed)
Hematology and Oncology Follow Up Visit  [name] [id number] 08/10/1936 83 y.o. 06/29/2019 2:59 PM [name], MDVincent, [name],*   Principle Diagnosis: 83 year old man with castration-resistant prostate cancer with pelvic adenopathy diagnosed in 2018.  He initially presented with Gleason score 4+4 = 8 in 2009.  Prior Therapy: He was treated with androgen deprivation as well as definitive radiation therapy. His PSA nadir was 0.5 in 2009.   He developed advanced disease with PSA in February 2015 was up to 36.35 and a fluoride PET scan showed measurable disease in the second rib as well as the 10th thoracic vertebral body. He was started on androgen deprivation since that time. He had an excellent PSA response initially with a nadir down to 0.08 in April 2016.   His PSA was 0.8 in February 2017 and Casodex was added.   In April 2017 his PSA was 0.28 and started to rise again in July 2017. His PSA was 0.44 and subsequently was 1.09 in October 2017. On 10/09/2016 his PSA was 3.39. His testosterone level was 30.4. Casodex was discontinued in April 2018. His PSA was 7.7 in August 2018.   Current therapy:   Xtandi 160 mg daily started on 04/08/2017.   Androgen deprivation given Dr. Diona Fanti.  Interim History: [name] is here for a follow-up visit.  Since the last visit, he reports no major changes in his health.  Continues to ambulate with the help of a cane without any recent falls or syncope.  He denies any nausea vomiting.  He denies abdominal pain or discomfort.  He continues to tolerate Xtandi without any major issues.  He continues to lose weight rather gradually some of it intentionally.  Patient denied any alteration mental status, neuropathy, confusion or dizziness.  Denies any headaches or lethargy.  Denies any night sweats, weight loss or changes in appetite.  Denied orthopnea, dyspnea on exertion or chest discomfort.  Denies shortness of breath,  difficulty breathing hemoptysis or cough.  Denies any abdominal distention, nausea, early satiety or dyspepsia.  Denies any hematuria, frequency, dysuria or nocturia.  Denies any skin irritation, dryness or rash.  Denies any ecchymosis or petechiae.  Denies any lymphadenopathy or clotting.  Denies any heat or cold intolerance.  Denies any anxiety or depression.  Remaining review of system is negative.  Medications: Updated on review. Current Outpatient Medications  Medication Sig Dispense Refill  . alendronate (FOSAMAX) 70 MG tablet Take 70 mg by mouth once a week.     Marland Kitchen amLODipine (NORVASC) 10 MG tablet TAKE 1 TABLET (10 MG TOTAL) DAILY BY MOUTH. 90 tablet 3  . chlorthalidone (HYGROTON) 50 MG tablet Take 1 tablet (50 mg total) by mouth daily. 90 tablet 3  . fluticasone (FLONASE) 50 MCG/ACT nasal spray PLACE 1 SPRAY INTO BOTH NOSTRILS DAILY. 16 g 5  . Hypromellose (ARTIFICIAL TEARS OP) Place 1 drop into the left eye daily.    . ILEVRO 0.3 % ophthalmic suspension Place 1 drop into the right eye daily.   2  . KLOR-CON M10 10 MEQ tablet TAKE 1 TABLET BY MOUTH EVERY DAY (Patient taking differently: Take 10 mEq by mouth daily. ) 90 tablet 3  . lidocaine (LIDODERM) 5 % Place 1 patch onto the skin daily. Remove & Discard patch within 12 hours or as directed by MD 30 patch 0  . losartan (COZAAR) 100 MG tablet TAKE 1 TABLET (100 MG TOTAL) BY MOUTH DAILY. PLEASE NOTE CHANGE IN TABLET DOSE. 90 tablet 3  .  metFORMIN (GLUCOPHAGE-XR) 500 MG 24 hr tablet Take 1 tablet (500 mg total) by mouth daily with breakfast. 90 tablet 3  . potassium chloride (KLOR-CON) 10 MEQ tablet Take 1 tablet (10 mEq total) by mouth daily. 90 tablet 3  . pravastatin (PRAVACHOL) 20 MG tablet TAKE 1 TABLET BY MOUTH EVERY DAY IN THE EVENING (Patient taking differently: Take 20 mg by mouth daily. ) 90 tablet 3  . XTANDI 40 MG capsule TAKE 4 CAPSULES (160MG ) BY MOUTH DAILY (Patient taking differently: Take 160 mg by mouth daily. ) 120 capsule  2   No current facility-administered medications for this visit.      Allergies:  Allergies  Allergen Reactions  . Aspirin Nausea Only    Patient stated,"[name] get nauseated with higher doses of Aspirin."    Past Medical History, Surgical history, Social history, and Family History without any changes on review.  Physical Exam:     Blood pressure 117/67, pulse 77, temperature 98.2 F (36.8 C), temperature source Temporal, resp. rate 17, height 5\' 10"  (1.778 m), weight 220 lb 8 oz (100 kg), SpO2 100 %.     ECOG: 1   General appearance: Alert, awake without any distress. Head: Atraumatic without abnormalities Oropharynx: Without any thrush or ulcers. Eyes: No scleral icterus. Lymph nodes: No lymphadenopathy noted in the cervical, supraclavicular, or axillary nodes Heart:regular rate and rhythm, without any murmurs or gallops.   Lung: Clear to auscultation without any rhonchi, wheezes or dullness to percussion. Abdomin: Soft, nontender without any shifting dullness or ascites. Musculoskeletal: No clubbing or cyanosis. Neurological: No motor or sensory deficits. Skin: No rashes or lesions.          Lab Results: Lab Results  Component Value Date   WBC 4.9 06/27/2019   HGB 14.7 06/27/2019   HCT 42.4 06/27/2019   MCV 88.9 06/27/2019   PLT 281 06/27/2019     Chemistry      Component Value Date/Time   NA 138 06/27/2019 1146   NA 141 08/12/2017 1258   K 3.6 06/27/2019 1146   K 3.4 (L) 08/12/2017 1258   CL 102 06/27/2019 1146   CO2 22 06/27/2019 1146   CO2 27 08/12/2017 1258   BUN 15 06/27/2019 1146   BUN 14.9 08/12/2017 1258   CREATININE 1.02 06/27/2019 1146   CREATININE 1.0 08/12/2017 1258      Component Value Date/Time   CALCIUM 9.6 06/27/2019 1146   CALCIUM 9.5 08/12/2017 1258   ALKPHOS 98 06/27/2019 1146   ALKPHOS 62 08/12/2017 1258   AST 23 06/27/2019 1146   AST 36 (H) 08/12/2017 1258   ALT 25 06/27/2019 1146   ALT 43 08/12/2017 1258   BILITOT  0.4 06/27/2019 1146   BILITOT 0.34 08/12/2017 1258       Results for [name], [name] (MRN [id number]) as of 06/29/2019 15:02  Ref. Range 04/25/2019 08:09 06/27/2019 11:46  Prostate Specific Ag, Serum Latest Ref Range: 0.0 - 4.0 ng/mL 31.7 (H) 54.6 (H)       Impression and Plan:   83 year old man with:  1.  Advanced prostate cancer diagnosed in 2018.  He has castration-resistant prostate with a pelvic adenopathy and bone disease.  He is currently on Xtandi with PSA continues to rise with a rapid doubling time.  Imaging studies in May 2020 did show mild progression although is asymptomatic.  Treatment options at this time were reviewed which include switching to [facility] versus systemic chemotherapy.  Switching to Zytiga would likely not yield  a great benefits given his previous exposure to [place].  Complication associated with systemic chemotherapy that include nausea, vomiting, myelosuppression, neutropenia and neutropenic sepsis were reviewed.  Alternative related complication associated with Zytiga including hypertension, weight gain and edema.  After discussion today he would like to think about it and let me know in the near future.  2. Androgen deprivation therapy: He continues to receive that under the care of [facility].  [name] recommended continuing that indefinitely.  3. Bone directed therapy:: [name] recommended calcium and vitamin D supplements.  Delton See will be considered after obtaining dental clearance.  He is currently on Fosamax.  4.  Prognosis and goals of care: His disease is incurable although aggressive measures are warranted given his reasonable performance status.  5.  Hypertension: His blood pressure is under control.  6. Follow-up: To be determined pending his decision of how to proceed.  25  minutes was spent with the patient face-to-face today.  More than 50% of time was spent on reviewing the natural course of his disease, treatment options and complications  related to therapy.      Zola Button, MD 11/19/20202:59 PM

## 2019-06-30 ENCOUNTER — Telehealth: Payer: Self-pay | Admitting: Oncology

## 2019-06-30 NOTE — Telephone Encounter (Signed)
No los per 11/19 °

## 2019-07-03 ENCOUNTER — Telehealth: Payer: Self-pay

## 2019-07-03 NOTE — Telephone Encounter (Signed)
Received call from patient spouse stating that the patient would like to pursue chemotherapy. Explained that will make Dr. Alen Blew aware that then they will be contacted by a scheduler that will provide them the time and dates of appointments. Dr. Alen Blew made aware.

## 2019-07-04 ENCOUNTER — Telehealth: Payer: Self-pay

## 2019-07-04 ENCOUNTER — Other Ambulatory Visit: Payer: Self-pay | Admitting: Oncology

## 2019-07-04 ENCOUNTER — Telehealth: Payer: Self-pay | Admitting: Oncology

## 2019-07-04 DIAGNOSIS — C61 Malignant neoplasm of prostate: Secondary | ICD-10-CM

## 2019-07-04 NOTE — Telephone Encounter (Signed)
Scheduled appt per 11/24 sch message - pt wife is aware of appt date and time.

## 2019-07-04 NOTE — Telephone Encounter (Signed)
-----   Message from Wyatt Portela, MD sent at 07/04/2019  7:39 AM EST ----- Regarding: RE: Treatment decision Message sent to scheduling. Thanks ----- Message ----- From: Scot Dock, RN Sent: 07/03/2019  12:04 PM EST To: Wyatt Portela, MD Subject: Treatment decision                             Patient spouse called and stated that the patient wants to pursue chemotherapy.

## 2019-07-04 NOTE — Progress Notes (Signed)
START ON PATHWAY REGIMEN - Prostate     A cycle is every 21 days:     Docetaxel      Prednisone   **Always confirm dose/schedule in your pharmacy ordering system**  Patient Characteristics: Adenocarcinoma, Distant Metastases, Castration Resistant, Symptomatic, Docetaxel Eligible Histology: Adenocarcinoma Therapeutic Status: Distant Metastases  Intent of Therapy: Non-Curative / Palliative Intent, Discussed with Patient 

## 2019-07-07 ENCOUNTER — Telehealth: Payer: Self-pay | Admitting: Oncology

## 2019-07-07 NOTE — Telephone Encounter (Signed)
Added injection appointments per 11/27 schedule message. Confirmed next appointments for 11/30, 12/3 and 12/5 with wife. Patient will get updated schedule at next visit.

## 2019-07-10 ENCOUNTER — Other Ambulatory Visit: Payer: Self-pay

## 2019-07-10 ENCOUNTER — Inpatient Hospital Stay: Payer: Medicare Other

## 2019-07-11 ENCOUNTER — Telehealth: Payer: Self-pay | Admitting: *Deleted

## 2019-07-11 ENCOUNTER — Other Ambulatory Visit: Payer: Self-pay

## 2019-07-11 MED ORDER — PROCHLORPERAZINE MALEATE 10 MG PO TABS: 10.0000 mg | ORAL_TABLET | Freq: Four times a day (QID) | ORAL | 0 refills | Status: AC | PRN

## 2019-07-11 NOTE — Telephone Encounter (Signed)
Left vm for pt that nausea med called in & Dr Alen Blew reports that he will not get prednisone & to therefore disregard any information on prednisone.  Pt to call back if questions.

## 2019-07-11 NOTE — Telephone Encounter (Signed)
-----   Message from Wyatt Portela, MD sent at 07/11/2019  7:25 AM EST ----- Regarding: RE: antinausea Compazine 10 q6 prn #30. Thanks.  ----- Message ----- From: Jesse Fall, RN Sent: 07/10/2019   3:35 PM EST To: [facility] Subject: antinausea                                     Pt may need something for nausea.  Thanks, Derin Granquist

## 2019-07-13 ENCOUNTER — Inpatient Hospital Stay: Payer: Medicare Other

## 2019-07-13 ENCOUNTER — Inpatient Hospital Stay: Payer: Medicare Other | Attending: Oncology

## 2019-07-13 ENCOUNTER — Other Ambulatory Visit: Payer: Self-pay

## 2019-07-13 VITALS — BP 99/62 | HR 68 | Temp 98.3°F | Resp 17

## 2019-07-13 DIAGNOSIS — C61 Malignant neoplasm of prostate: Secondary | ICD-10-CM | POA: Diagnosis present

## 2019-07-13 DIAGNOSIS — C7951 Secondary malignant neoplasm of bone: Secondary | ICD-10-CM | POA: Diagnosis present

## 2019-07-13 DIAGNOSIS — Z5189 Encounter for other specified aftercare: Secondary | ICD-10-CM | POA: Diagnosis not present

## 2019-07-13 DIAGNOSIS — Z5111 Encounter for antineoplastic chemotherapy: Secondary | ICD-10-CM | POA: Insufficient documentation

## 2019-07-13 LAB — CBC WITH DIFFERENTIAL (CANCER CENTER ONLY)
Abs Immature Granulocytes: 0.02 10*3/uL (ref 0.00–0.07)
Basophils Absolute: 0 10*3/uL (ref 0.0–0.1)
Basophils Relative: 0 %
Eosinophils Absolute: 0 10*3/uL (ref 0.0–0.5)
Eosinophils Relative: 1 %
HCT: 39.5 % (ref 39.0–52.0)
Hemoglobin: 13.7 g/dL (ref 13.0–17.0)
Immature Granulocytes: 0 %
Lymphocytes Relative: 48 %
Lymphs Abs: 3.1 10*3/uL (ref 0.7–4.0)
MCH: 30.4 pg (ref 26.0–34.0)
MCHC: 34.7 g/dL (ref 30.0–36.0)
MCV: 87.6 fL (ref 80.0–100.0)
Monocytes Absolute: 0.7 10*3/uL (ref 0.1–1.0)
Monocytes Relative: 11 %
Neutro Abs: 2.6 10*3/uL (ref 1.7–7.7)
Neutrophils Relative %: 40 %
Platelet Count: 337 10*3/uL (ref 150–400)
RBC: 4.51 MIL/uL (ref 4.22–5.81)
RDW: 13.2 % (ref 11.5–15.5)
WBC Count: 6.5 10*3/uL (ref 4.0–10.5)
nRBC: 0 % (ref 0.0–0.2)

## 2019-07-13 LAB — CMP (CANCER CENTER ONLY)
ALT: 22 U/L (ref 0–44)
AST: 20 U/L (ref 15–41)
Albumin: 3.6 g/dL (ref 3.5–5.0)
Alkaline Phosphatase: 112 U/L (ref 38–126)
Anion gap: 12 (ref 5–15)
BUN: 17 mg/dL (ref 8–23)
CO2: 22 mmol/L (ref 22–32)
Calcium: 9.5 mg/dL (ref 8.9–10.3)
Chloride: 103 mmol/L (ref 98–111)
Creatinine: 1.12 mg/dL (ref 0.61–1.24)
GFR, Est AFR Am: >60 mL/min (ref >60)
GFR, Estimated: >60 mL/min (ref >60)
Glucose, Bld: 104 mg/dL — ABNORMAL HIGH (ref 70–99)
Potassium: 3.2 mmol/L — ABNORMAL LOW (ref 3.5–5.1)
Sodium: 137 mmol/L (ref 135–145)
Total Bilirubin: 0.4 mg/dL (ref 0.3–1.2)
Total Protein: 8.3 g/dL — ABNORMAL HIGH (ref 6.5–8.1)

## 2019-07-13 MED ORDER — SODIUM CHLORIDE 0.9 % IV SOLN
Freq: Once | INTRAVENOUS | Status: AC
  Administered 2019-07-13: 11:00:00 via INTRAVENOUS
  Filled 2019-07-13: qty 250

## 2019-07-13 MED ORDER — DEXAMETHASONE SODIUM PHOSPHATE 10 MG/ML IJ SOLN
INTRAMUSCULAR | Status: AC
  Filled 2019-07-13: qty 1

## 2019-07-13 MED ORDER — SODIUM CHLORIDE 0.9 % IV SOLN
75.0000 mg/m2 | Freq: Once | INTRAVENOUS | Status: AC
  Administered 2019-07-13: 170 mg via INTRAVENOUS
  Filled 2019-07-13: qty 17

## 2019-07-13 MED ORDER — DEXAMETHASONE SODIUM PHOSPHATE 10 MG/ML IJ SOLN
10.0000 mg | Freq: Once | INTRAMUSCULAR | Status: AC
Start: 2019-07-13 — End: 2019-07-13
  Administered 2019-07-13: 11:00:00 10 mg via INTRAVENOUS

## 2019-07-13 NOTE — Progress Notes (Signed)
Dr. Alen Blew aware of BP today, patient took am Norvasc and denies any symptoms of hypotension. Ok to treat per MD.

## 2019-07-13 NOTE — Patient Instructions (Addendum)
[facility] Discharge Instructions for Patients Receiving Chemotherapy  Today you received the following chemotherapy agents Taxotere.  To help prevent nausea and vomiting after your treatment, we encourage you to take your nausea medication as directed.   If you develop nausea and vomiting that is not controlled by your nausea medication, call the clinic.   BELOW ARE SYMPTOMS THAT SHOULD BE REPORTED IMMEDIATELY:  *FEVER GREATER THAN 100.5 F  *CHILLS WITH OR WITHOUT FEVER  NAUSEA AND VOMITING THAT IS NOT CONTROLLED WITH YOUR NAUSEA MEDICATION  *UNUSUAL SHORTNESS OF BREATH  *UNUSUAL BRUISING OR BLEEDING  TENDERNESS IN MOUTH AND THROAT WITH OR WITHOUT PRESENCE OF ULCERS  *URINARY PROBLEMS  *BOWEL PROBLEMS  UNUSUAL RASH Items with * indicate a potential emergency and should be followed up as soon as possible.  Feel free to call the clinic you have any questions or concerns. The clinic phone number is [phone].  Please show the [place] at check-in to the Emergency Department and triage nurse.   Docetaxel injection What is this medicine? DOCETAXEL (doe se TAX el) is a chemotherapy drug. It targets fast dividing cells, like cancer cells, and causes these cells to die. This medicine is used to treat many types of cancers like breast cancer, certain stomach cancers, head and neck cancer, lung cancer, and prostate cancer. This medicine may be used for other purposes; ask your health care provider or pharmacist if you have questions. COMMON BRAND NAME(S): Docefrez, Taxotere What should I tell my health care provider before I take this medicine? They need to know if you have any of these conditions:  infection (especially a virus infection such as chickenpox, cold sores, or herpes)  liver disease  low blood counts, like low white cell, platelet, or red cell counts  an unusual or allergic reaction to docetaxel, polysorbate 80, other chemotherapy  agents, other medicines, foods, dyes, or preservatives  pregnant or trying to get pregnant  breast-feeding How should I use this medicine? This drug is given as an infusion into a vein. It is administered in a hospital or clinic by a specially trained health care professional. Talk to your pediatrician regarding the use of this medicine in children. Special care may be needed. Overdosage: If you think you have taken too much of this medicine contact a poison control center or emergency room at once. NOTE: This medicine is only for you. Do not share this medicine with others. What if I miss a dose? It is important not to miss your dose. Call your doctor or health care professional if you are unable to keep an appointment. What may interact with this medicine?  aprepitant  certain antibiotics like erythromycin or clarithromycin  certain antivirals for HIV or hepatitis  certain medicines for fungal infections like fluconazole, itraconazole, ketoconazole, posaconazole, or voriconazole  cimetidine  ciprofloxacin  conivaptan  cyclosporine  dronedarone  fluvoxamine  grapefruit juice  imatinib  verapamil This list may not describe all possible interactions. Give your health care provider a list of all the medicines, herbs, non-prescription drugs, or dietary supplements you use. Also tell them if you smoke, drink alcohol, or use illegal drugs. Some items may interact with your medicine. What should I watch for while using this medicine? Your condition will be monitored carefully while you are receiving this medicine. You will need important blood work done while you are taking this medicine. Call your doctor or health care professional for advice if you get a fever, chills or  sore throat, or other symptoms of a cold or flu. Do not treat yourself. This drug decreases your body's ability to fight infections. Try to avoid being around people who are sick. Some products may contain  alcohol. Ask your health care professional if this medicine contains alcohol. Be sure to tell all health care professionals you are taking this medicine. Certain medicines, like metronidazole and disulfiram, can cause an unpleasant reaction when taken with alcohol. The reaction includes flushing, headache, nausea, vomiting, sweating, and increased thirst. The reaction can last from 30 minutes to several hours. You may get drowsy or dizzy. Do not drive, use machinery, or do anything that needs mental alertness until you know how this medicine affects you. Do not stand or sit up quickly, especially if you are an older patient. This reduces the risk of dizzy or fainting spells. Alcohol may interfere with the effect of this medicine. Talk to your health care professional about your risk of cancer. You may be more at risk for certain types of cancer if you take this medicine. Do not become pregnant while taking this medicine or for 6 months after stopping it. Women should inform their doctor if they wish to become pregnant or think they might be pregnant. There is a potential for serious side effects to an unborn child. Talk to your health care professional or pharmacist for more information. Do not breast-feed an infant while taking this medicine or for 1 to 2 weeks after stopping it. Males who get this medicine must use a condom during sex with females who can get pregnant. If you get a woman pregnant, the baby could have birth defects. The baby could die before they are born. You will need to continue wearing a condom for 3 months after stopping the medicine. Tell your health care provider right away if your partner becomes pregnant while you are taking this medicine. This may interfere with the ability to father a child. You should talk to your doctor or health care professional if you are concerned about your fertility. What side effects may I notice from receiving this medicine? Side effects that you should  report to your doctor or health care professional as soon as possible:  allergic reactions like skin rash, itching or hives, swelling of the face, lips, or tongue  blurred vision  breathing problems  changes in vision  low blood counts - This drug may decrease the number of white blood cells, red blood cells and platelets. You may be at increased risk for infections and bleeding.  nausea and vomiting  pain, redness or irritation at site where injected  pain, tingling, numbness in the hands or feet  redness, blistering, peeling, or loosening of the skin, including inside the mouth  signs of decreased platelets or bleeding - bruising, pinpoint red spots on the skin, black, tarry stools, nosebleeds  signs of decreased red blood cells - unusually weak or tired, fainting spells, lightheadedness  signs of infection - fever or chills, cough, sore throat, pain or difficulty passing urine  swelling of the ankle, feet, hands Side effects that usually do not require medical attention (report to your doctor or health care professional if they continue or are bothersome):  constipation  diarrhea  fingernail or toenail changes  hair loss  loss of appetite  mouth sores  muscle pain This list may not describe all possible side effects. Call your doctor for medical advice about side effects. You may report side effects to FDA at 1-800-FDA-1088.  Where should I keep my medicine? This drug is given in a hospital or clinic and will not be stored at home. NOTE: This sheet is a summary. It may not cover all possible information. If you have questions about this medicine, talk to your doctor, pharmacist, or health care provider.  2020 Elsevier/Gold Standard (2018-09-30 12:23:11)  Potassium Content of Foods  Potassium is a mineral found in many foods and drinks. It affects how the heart works, and helps keep fluids and minerals balanced in the body. The amount of potassium you need each  day depends on your age and any medical conditions you may have. Talk to your health care provider or dietitian about how much potassium you need. The following lists of foods provide the general serving size for foods and the approximate amount of potassium in each serving, listed in milligrams (mg). Actual values may vary depending on the product and how it is processed. High in potassium The following foods and beverages have 200 mg or more of potassium per serving:  Apricots (raw) - 2 have 200 mg of potassium.  Apricots (dry) - 5 have 200 mg of potassium.  Artichoke - 1 medium has 345 mg of potassium.  Avocado -  fruit has 245 mg of potassium.  Banana - 1 medium fruit has 425 mg of potassium.  [place] or baked beans (canned) -  cup has 280 mg of potassium.  White beans (canned) -  cup has 595 mg potassium.  Beef roast - 3 oz has 320 mg of potassium.  Ground beef - 3 oz has 270 mg of potassium.  Beets (raw or cooked) -  cup has 260 mg of potassium.  Bran muffin - 2 oz has 300 mg of potassium.  Broccoli (cooked) -  cup has 230 mg of potassium.  Brussels sprouts -  cup has 250 mg of potassium.  Cantaloupe -  cup has 215 mg of potassium.  Cereal, 100% bran -  cup has 200-400 mg of potassium.  Cheeseburger -1 single fast food burger has 225-400 mg of potassium.  Chicken - 3 oz has 220 mg of potassium.  Clams (canned) - 3 oz has 535 mg of potassium.  Crab - 3 oz has 225 mg of potassium.  Dates - 5 have 270 mg of potassium.  Dried beans and peas -  cup has 300-475 mg of potassium.  Figs (dried) - 2 have 260 mg of potassium.  Fish (halibut, tuna, cod, snapper) - 3 oz has 480 mg of potassium.  Fish (salmon, haddock, swordfish, perch) - 3 oz has 300 mg of potassium.  Fish (tuna, canned) - 3 oz has 200 mg of potassium.  [place] fries (fast food) - 3 oz has 470 mg of potassium.  Granola with fruit and nuts -  cup has 200 mg of potassium.  Grapefruit juice -   cup has 200 mg of potassium.  Honeydew melon -  cup has 200 mg of potassium.  Kale (raw) - 1 cup has 300 mg of potassium.  Kiwi - 1 medium fruit has 240 mg of potassium.  Kohlrabi, rutabaga, parsnips -  cup has 280 mg of potassium.  Lentils -  cup has 365 mg of potassium.  Mango - 1 each has 325 mg of potassium.  Milk (nonfat, low-fat, whole, buttermilk) - 1 cup has 350-380 mg of potassium.  Milk (chocolate) - 1 cup has 420 mg of potassium  Molasses - 1 Tbsp has 295 mg of potassium.  Mushrooms -  cup has 280 mg of potassium.  Nectarine - 1 each has 275 mg of potassium.  Nuts (almonds, peanuts, hazelnuts, [place], cashew, mixed) - 1 oz has 200 mg of potassium.  Nuts (pistachios) - 1 oz has 295 mg of potassium.  Orange - 1 fruit has 240 mg of potassium.  Orange juice -  cup has 235 mg of potassium.  Papaya -  medium fruit has 390 mg of potassium.  Peanut butter (chunky) - 2 Tbsp has 240 mg of potassium.  Peanut butter (smooth) - 2 Tbsp has 210 mg of potassium.  Pear - 1 medium (200 mg) of potassium.  Pomegranate - 1 whole fruit has 400 mg of potassium.  Pomegranate juice -  cup has 215 mg of potassium.  Pork - 3 oz has 350 mg of potassium.  Potato chips (salted) - 1 oz has 465 mg of potassium.  Potato (baked with skin) - 1 medium has 925 mg of potassium.  Potato (boiled) -  cup has 255 mg of potassium.  Potato (Mashed) -  cup has 330 mg of potassium.  Prune juice -  cup has 370 mg of potassium.  Prunes - 5 have 305 mg of potassium.  Pudding (chocolate) -  cup has 230 mg of potassium.  Pumpkin (canned) -  cup has 250 mg of potassium.  Raisins (seedless) -  cup has 270 mg of potassium.  Seeds (sunflower or pumpkin) - 1 oz has 240 mg of potassium.  Soy milk - 1 cup has 300 mg of potassium.  Spinach (cooked) - 1/2 cup has 420 mg of potassium.  Spinach (canned) -  cup has 370 mg of potassium.  Sweet potato (baked with skin) - 1 medium has  450 mg of potassium.  Swiss chard -  cup has 480 mg of potassium.  Tomato or vegetable juice -  cup has 275 mg of potassium.  Tomato (sauce or puree) -  cup has 400-550 mg of potassium.  Tomato (raw) - 1 medium has 290 mg of potassium.  Tomato (canned) -  cup has 200-300 mg of potassium.  [place] - 3 oz has 250 mg of potassium.  Wheat germ - 1 oz has 250 mg of potassium.  Winter squash -  cup has 250 mg of potassium.  Yogurt (plain or fruited) - 6 oz has 260-435 mg of potassium.  Zucchini -  cup has 220 mg of potassium. Moderate in potassium The following foods and beverages have 50-200 mg of potassium per serving:  Apple - 1 fruit has 150 mg of potassium  Apple juice -  cup has 150 mg of potassium  Applesauce -  cup has 90 mg of potassium  Apricot nectar -  cup has 140 mg of potassium  Asparagus (small spears) -  cup has 155 mg of potassium  Asparagus (large spears) - 6 have 155 mg of potassium  Bagel (cinnamon raisin) - 1 four-inch bagel has 130 mg of potassium  Bagel (egg or plain) - 1 four- inch bagel has 70 mg of potassium  Beans (green) -  cup has 90 mg of potassium  Beans (yellow) -  cup has 190 mg of potassium  Beer, regular - 12 oz has 100 mg of potassium  Beets (canned) -  cup has 125 mg of potassium  Blackberries -  cup has 115 mg of potassium  Blueberries -  cup has 60 mg of potassium  Bread (whole wheat) - 1 slice has 70 mg of potassium  Broccoli (raw) -  cup has 145 mg of potassium  Cabbage -  cup has 150 mg of potassium  Carrots (cooked or raw) -  cup has 180 mg of potassium  Cauliflower (raw) -  cup has 150 mg of potassium  Celery (raw) -  cup has 155 mg of potassium  Cereal, bran flakes -  cup has 120-150 mg of potassium  Cheese (cottage) -  cup has 110 mg of potassium  Cherries - 10 have 150 mg of potassium  Chocolate - 1 oz bar has 165 mg of potassium  Coffee (brewed) - 6 oz has 90 mg of potassium  Corn  -  cup or 1 ear has 195 mg of potassium  Cucumbers -  cup has 80 mg of potassium  Egg - 1 large egg has 60 mg of potassium  Eggplant -  cup has 60 mg of potassium  Endive (raw) -  cup has 80 mg of potassium  English muffin - 1 has 65 mg of potassium  Fish (ocean perch) - 3 oz has 192 mg of potassium  Frankfurter, beef or pork - 1 has 75 mg of potassium  Fruit cocktail -  cup has 115 mg of potassium  Grape juice -  cup has 170 mg of potassium  Grapefruit -  fruit has 175 mg of potassium  Grapes -  cup has 155 mg of potassium  Greens: kale, turnip, collard -  cup has 110-150 mg of potassium  Ice cream or frozen yogurt (chocolate) -  cup has 175 mg of potassium  Ice cream or frozen yogurt (vanilla) -  cup has 120-150 mg of potassium  Lemons, limes - 1 each has 80 mg of potassium  Lettuce - 1 cup has 100 mg of potassium  Mixed vegetables -  cup has 150 mg of potassium  Mushrooms, raw -  cup has 110 mg of potassium  Nuts (walnuts, pecans, or macadamia) - 1 oz has 125 mg of potassium  Oatmeal -  cup has 80 mg of potassium  Okra -  cup has 110 mg of potassium  Onions -  cup has 120 mg of potassium  Peach - 1 has 185 mg of potassium  Peaches (canned) -  cup has 120 mg of potassium  Pears (canned) -  cup has 120 mg of potassium  Peas, green (frozen) -  cup has 90 mg of potassium  Peppers (Green) -  cup has 130 mg of potassium  Peppers (Red) -  cup has 160 mg of potassium  Pineapple juice -  cup has 165 mg of potassium  Pineapple (fresh or canned) -  cup has 100 mg of potassium  Plums - 1 has 105 mg of potassium  Pudding, vanilla -  cup has 150 mg of potassium  Raspberries -  cup has 90 mg of potassium  Rhubarb -  cup has 115 mg of potassium  Rice, wild -  cup has 80 mg of potassium  Shrimp - 3 oz has 155 mg of potassium  Spinach (raw) - 1 cup has 170 mg of potassium  Strawberries -  cup has 125 mg of potassium  Summer  squash -  cup has 175-200 mg of potassium  Swiss chard (raw) - 1 cup has 135 mg of potassium  Tangerines - 1 fruit has 140 mg of potassium  Tea, brewed - 6 oz has 65 mg of potassium  Turnips -  cup has 140 mg of potassium  Watermelon -  cup has 85 mg of potassium  Wine (Red, table) - 5 oz has 180 mg of potassium  Wine (White, table) - 5 oz 100 mg of potassium Low in potassium The following foods and beverages have less than 50 mg of potassium per serving.  Bread (white) - 1 slice has 30 mg of potassium  Carbonated beverages - 12 oz has less than 5 mg of potassium  Cheese - 1 oz has 20-30 mg of potassium  Cranberries -  cup has 45 mg of potassium  Cranberry juice cocktail -  cup has 20 mg of potassium  Fats and oils - 1 Tbsp has less than 5 mg of potassium  Hummus - 1 Tbsp has 32 mg of potassium  Nectar (papaya, mango, or pear) -  cup has 35 mg of potassium  Rice (white or brown) -  cup has 50 mg of potassium  Spaghetti or macaroni (cooked) -  cup has 30 mg of potassium  T[place]illa, flour or corn - 1 has 50 mg of potassium  Waffle - 1 four-inch waffle has 50 mg of potassium  Water chestnuts -  cup has 40 mg of potassium Summary  Potassium is a mineral found in many foods and drinks. It affects how the heart works, and helps keep fluids and minerals balanced in the body.  The amount of potassium you need each day depends on your age and any existing medical conditions you may have. Your health care provider or dietitian may recommend an amount of potassium that you should have each day. This information is not intended to replace advice given to you by your health care provider. Make sure you discuss any questions you have with your health care provider. Document Released: 03/10/2005 Document Revised: 07/09/2017 Document Reviewed: 10/21/2016 Elsevier Patient Education  [place].

## 2019-07-14 ENCOUNTER — Inpatient Hospital Stay: Payer: Medicare Other

## 2019-07-14 ENCOUNTER — Telehealth: Payer: Self-pay | Admitting: *Deleted

## 2019-07-14 ENCOUNTER — Other Ambulatory Visit: Payer: Self-pay

## 2019-07-14 VITALS — BP 101/55 | HR 64 | Temp 98.2°F | Resp 16

## 2019-07-14 DIAGNOSIS — C61 Malignant neoplasm of prostate: Secondary | ICD-10-CM

## 2019-07-14 DIAGNOSIS — Z5111 Encounter for antineoplastic chemotherapy: Secondary | ICD-10-CM | POA: Diagnosis not present

## 2019-07-14 LAB — PROSTATE-SPECIFIC AG, SERUM (LABCORP): Prostate Specific Ag, Serum: 61.1 ng/mL — ABNORMAL HIGH (ref 0.0–4.0)

## 2019-07-14 MED ORDER — PEGFILGRASTIM INJECTION 6 MG/0.6ML ~~LOC~~
PREFILLED_SYRINGE | SUBCUTANEOUS | Status: AC
  Filled 2019-07-14: qty 0.6

## 2019-07-14 MED ORDER — PEGFILGRASTIM INJECTION 6 MG/0.6ML ~~LOC~~
6.0000 mg | PREFILLED_SYRINGE | Freq: Once | SUBCUTANEOUS | Status: AC
  Administered 2019-07-14: 15:00:00 6 mg via SUBCUTANEOUS

## 2019-07-15 ENCOUNTER — Inpatient Hospital Stay: Payer: Medicare Other

## 2019-08-03 ENCOUNTER — Inpatient Hospital Stay: Payer: Medicare Other

## 2019-08-03 ENCOUNTER — Other Ambulatory Visit: Payer: Self-pay

## 2019-08-03 ENCOUNTER — Inpatient Hospital Stay (HOSPITAL_BASED_OUTPATIENT_CLINIC_OR_DEPARTMENT_OTHER): Payer: Medicare Other | Admitting: Oncology

## 2019-08-03 VITALS — BP 128/86 | HR 84 | Temp 98.2°F | Resp 18 | Ht 70.0 in | Wt 210.9 lb

## 2019-08-03 DIAGNOSIS — C61 Malignant neoplasm of prostate: Secondary | ICD-10-CM

## 2019-08-03 DIAGNOSIS — Z5111 Encounter for antineoplastic chemotherapy: Secondary | ICD-10-CM | POA: Diagnosis not present

## 2019-08-03 LAB — CMP (CANCER CENTER ONLY)
ALT: 20 U/L (ref 0–44)
AST: 24 U/L (ref 15–41)
Albumin: 3.7 g/dL (ref 3.5–5.0)
Alkaline Phosphatase: 128 U/L — ABNORMAL HIGH (ref 38–126)
Anion gap: 12 (ref 5–15)
BUN: 19 mg/dL (ref 8–23)
CO2: 21 mmol/L — ABNORMAL LOW (ref 22–32)
Calcium: 9.3 mg/dL (ref 8.9–10.3)
Chloride: 105 mmol/L (ref 98–111)
Creatinine: 1.14 mg/dL (ref 0.61–1.24)
GFR, Est AFR Am: >60 mL/min (ref >60)
GFR, Estimated: 60 mL/min — ABNORMAL LOW (ref >60)
Glucose, Bld: 107 mg/dL — ABNORMAL HIGH (ref 70–99)
Potassium: 3.1 mmol/L — ABNORMAL LOW (ref 3.5–5.1)
Sodium: 138 mmol/L (ref 135–145)
Total Bilirubin: 0.6 mg/dL (ref 0.3–1.2)
Total Protein: 8 g/dL (ref 6.5–8.1)

## 2019-08-03 LAB — CBC WITH DIFFERENTIAL (CANCER CENTER ONLY)
Abs Immature Granulocytes: 0.07 10*3/uL (ref 0.00–0.07)
Basophils Absolute: 0 10*3/uL (ref 0.0–0.1)
Basophils Relative: 1 %
Eosinophils Absolute: 0 10*3/uL (ref 0.0–0.5)
Eosinophils Relative: 0 %
HCT: 34.5 % — ABNORMAL LOW (ref 39.0–52.0)
Hemoglobin: 11.9 g/dL — ABNORMAL LOW (ref 13.0–17.0)
Immature Granulocytes: 1 %
Lymphocytes Relative: 27 %
Lymphs Abs: 2 10*3/uL (ref 0.7–4.0)
MCH: 30.4 pg (ref 26.0–34.0)
MCHC: 34.5 g/dL (ref 30.0–36.0)
MCV: 88 fL (ref 80.0–100.0)
Monocytes Absolute: 1.1 10*3/uL — ABNORMAL HIGH (ref 0.1–1.0)
Monocytes Relative: 14 %
Neutro Abs: 4.4 10*3/uL (ref 1.7–7.7)
Neutrophils Relative %: 57 %
Platelet Count: 270 10*3/uL (ref 150–400)
RBC: 3.92 MIL/uL — ABNORMAL LOW (ref 4.22–5.81)
RDW: 14.1 % (ref 11.5–15.5)
WBC Count: 7.6 10*3/uL (ref 4.0–10.5)
nRBC: 0 % (ref 0.0–0.2)

## 2019-08-03 MED ORDER — DEXAMETHASONE SODIUM PHOSPHATE 10 MG/ML IJ SOLN
10.0000 mg | Freq: Once | INTRAMUSCULAR | Status: AC
  Administered 2019-08-03: 10 mg via INTRAVENOUS

## 2019-08-03 MED ORDER — SODIUM CHLORIDE 0.9 % IV SOLN
Freq: Once | INTRAVENOUS | Status: AC
  Filled 2019-08-03: qty 250

## 2019-08-03 MED ORDER — SODIUM CHLORIDE 0.9 % IV SOLN
75.0000 mg/m2 | Freq: Once | INTRAVENOUS | Status: AC
  Administered 2019-08-03: 170 mg via INTRAVENOUS
  Filled 2019-08-03: qty 17

## 2019-08-03 MED ORDER — DEXAMETHASONE SODIUM PHOSPHATE 10 MG/ML IJ SOLN
INTRAMUSCULAR | Status: AC
  Filled 2019-08-03: qty 1

## 2019-08-03 NOTE — Patient Instructions (Signed)
Docetaxel injection What is this medicine? DOCETAXEL (doe se TAX el) is a chemotherapy drug. It targets fast dividing cells, like cancer cells, and causes these cells to die. This medicine is used to treat many types of cancers like breast cancer, certain stomach cancers, head and neck cancer, lung cancer, and prostate cancer. This medicine may be used for other purposes; ask your health care provider or pharmacist if you have questions. COMMON BRAND NAME(S): Docefrez, Taxotere What should I tell my health care provider before I take this medicine? They need to know if you have any of these conditions:  infection (especially a virus infection such as chickenpox, cold sores, or herpes)  liver disease  low blood counts, like low white cell, platelet, or red cell counts  an unusual or allergic reaction to docetaxel, polysorbate 80, other chemotherapy agents, other medicines, foods, dyes, or preservatives  pregnant or trying to get pregnant  breast-feeding How should I use this medicine? This drug is given as an infusion into a vein. It is administered in a hospital or clinic by a specially trained health care professional. Talk to your pediatrician regarding the use of this medicine in children. Special care may be needed. Overdosage: If you think you have taken too much of this medicine contact a poison control center or emergency room at once. NOTE: This medicine is only for you. Do not share this medicine with others. What if I miss a dose? It is important not to miss your dose. Call your doctor or health care professional if you are unable to keep an appointment. What may interact with this medicine?  aprepitant  certain antibiotics like erythromycin or clarithromycin  certain antivirals for HIV or hepatitis  certain medicines for fungal infections like fluconazole, itraconazole, ketoconazole, posaconazole, or  voriconazole  cimetidine  ciprofloxacin  conivaptan  cyclosporine  dronedarone  fluvoxamine  grapefruit juice  imatinib  verapamil This list may not describe all possible interactions. Give your health care provider a list of all the medicines, herbs, non-prescription drugs, or dietary supplements you use. Also tell them if you smoke, drink alcohol, or use illegal drugs. Some items may interact with your medicine. What should I watch for while using this medicine? Your condition will be monitored carefully while you are receiving this medicine. You will need important blood work done while you are taking this medicine. Call your doctor or health care professional for advice if you get a fever, chills or sore throat, or other symptoms of a cold or flu. Do not treat yourself. This drug decreases your body's ability to fight infections. Try to avoid being around people who are sick. Some products may contain alcohol. Ask your health care professional if this medicine contains alcohol. Be sure to tell all health care professionals you are taking this medicine. Certain medicines, like metronidazole and disulfiram, can cause an unpleasant reaction when taken with alcohol. The reaction includes flushing, headache, nausea, vomiting, sweating, and increased thirst. The reaction can last from 30 minutes to several hours. You may get drowsy or dizzy. Do not drive, use machinery, or do anything that needs mental alertness until you know how this medicine affects you. Do not stand or sit up quickly, especially if you are an older patient. This reduces the risk of dizzy or fainting spells. Alcohol may interfere with the effect of this medicine. Talk to your health care professional about your risk of cancer. You may be more at risk for certain types of cancer if   you take this medicine. Do not become pregnant while taking this medicine or for 6 months after stopping it. Women should inform their doctor if  they wish to become pregnant or think they might be pregnant. There is a potential for serious side effects to an unborn child. Talk to your health care professional or pharmacist for more information. Do not breast-feed an infant while taking this medicine or for 1 to 2 weeks after stopping it. Males who get this medicine must use a condom during sex with females who can get pregnant. If you get a woman pregnant, the baby could have birth defects. The baby could die before they are born. You will need to continue wearing a condom for 3 months after stopping the medicine. Tell your health care provider right away if your partner becomes pregnant while you are taking this medicine. This may interfere with the ability to father a child. You should talk to your doctor or health care professional if you are concerned about your fertility. What side effects may I notice from receiving this medicine? Side effects that you should report to your doctor or health care professional as soon as possible:  allergic reactions like skin rash, itching or hives, swelling of the face, lips, or tongue  blurred vision  breathing problems  changes in vision  low blood counts - This drug may decrease the number of white blood cells, red blood cells and platelets. You may be at increased risk for infections and bleeding.  nausea and vomiting  pain, redness or irritation at site where injected  pain, tingling, numbness in the hands or feet  redness, blistering, peeling, or loosening of the skin, including inside the mouth  signs of decreased platelets or bleeding - bruising, pinpoint red spots on the skin, black, tarry stools, nosebleeds  signs of decreased red blood cells - unusually weak or tired, fainting spells, lightheadedness  signs of infection - fever or chills, cough, sore throat, pain or difficulty passing urine  swelling of the ankle, feet, hands Side effects that usually do not require medical  attention (report to your doctor or health care professional if they continue or are bothersome):  constipation  diarrhea  fingernail or toenail changes  hair loss  loss of appetite  mouth sores  muscle pain This list may not describe all possible side effects. Call your doctor for medical advice about side effects. You may report side effects to FDA at 1-800-FDA-1088. Where should I keep my medicine? This drug is given in a hospital or clinic and will not be stored at home. NOTE: This sheet is a summary. It may not cover all possible information. If you have questions about this medicine, talk to your doctor, pharmacist, or health care provider.  2020 Elsevier/Gold Standard (2018-09-30 12:23:11)  

## 2019-08-03 NOTE — Progress Notes (Signed)
Hematology and Oncology Follow Up Visit  BAIN LEWI [id number] 1936/05/24 83 y.o. 08/03/2019 11:18 AM [name], MDVincent, [name],*   Principle Diagnosis: 83 year old man with advanced prostate cancer with pelvic adenopathy noted in 2018.  He was initially diagnosed in 2009 with Gleason score 4+4 = 8 prostate cancer.  Prior Therapy: He was treated with androgen deprivation as well as definitive radiation therapy. His PSA nadir was 0.5 in 2009.   He developed advanced disease with PSA in February 2015 was up to 36.35 and a fluoride PET scan showed measurable disease in the second rib as well as the 10th thoracic vertebral body. He was started on androgen deprivation since that time. He had an excellent PSA response initially with a nadir down to 0.08 in April 2016.   His PSA was 0.8 in February 2017 and Casodex was added.   In April 2017 his PSA was 0.28 and started to rise again in July 2017. His PSA was 0.44 and subsequently was 1.09 in October 2017. On 10/09/2016 his PSA was 3.39. His testosterone level was 30.4. Casodex was discontinued in April 2018. His PSA was 7.7 in August 2018.  Xtandi 160 mg daily started on 04/08/2017.  Therapy discontinued in November 2020 for progression of disease.   Current therapy:  Taxotere chemotherapy 75 mg per metered square started on July 13, 2019.  He is here for cycle 2.    Androgen deprivation under the care of [facility] and Dr. Diona Fanti.  Interim History: Mr. Dornbush returns today for a repeat evaluation.  Since the last visit, he received the first cycle of chemotherapy without any major complications.  He does report some fatigue and tiredness but no nausea or vomiting.  He did report left lower abdominal pain which has resolved after moving his bowels.  He did report occasional constipation.  His mobility has been slowed down slightly and using a walker at this time.  His appetite has been relatively poor  continues to lose weight even prior to the start of chemotherapy.  Denies any worsening neuropathy.  He denies any urinary difficulties.  He denied headaches, blurry vision, syncope or seizures.  Denies any fevers, chills or sweats.  Denied chest pain, palpitation, orthopnea or leg edema.  Denied cough, wheezing or hemoptysis.  Denied nausea, vomiting or abdominal pain.  Denies any constipation or diarrhea.  Denies any frequency urgency or hesitancy.  Denies any arthralgias or myalgias.  Denies any skin rashes or lesions.  Denies any bleeding or clotting tendency.  Denies any easy bruising.  Denies any hair or nail changes.  Denies any anxiety or depression.  Remaining review of system is negative.   Medications: Reviewed without any changes. Current Outpatient Medications  Medication Sig Dispense Refill  . alendronate (FOSAMAX) 70 MG tablet Take 70 mg by mouth once a week.     Marland Kitchen amLODipine (NORVASC) 10 MG tablet TAKE 1 TABLET (10 MG TOTAL) DAILY BY MOUTH. 90 tablet 3  . chlorthalidone (HYGROTON) 50 MG tablet Take 1 tablet (50 mg total) by mouth daily. 90 tablet 3  . fluticasone (FLONASE) 50 MCG/ACT nasal spray PLACE 1 SPRAY INTO BOTH NOSTRILS DAILY. 16 g 5  . Hypromellose (ARTIFICIAL TEARS OP) Place 1 drop into the left eye daily.    . ILEVRO 0.3 % ophthalmic suspension Place 1 drop into the right eye daily.   2  . KLOR-CON M10 10 MEQ tablet TAKE 1 TABLET BY MOUTH EVERY DAY (Patient taking differently: Take  10 mEq by mouth daily. ) 90 tablet 3  . lidocaine (LIDODERM) 5 % Place 1 patch onto the skin daily. Remove & Discard patch within 12 hours or as directed by MD 30 patch 0  . losartan (COZAAR) 100 MG tablet TAKE 1 TABLET (100 MG TOTAL) BY MOUTH DAILY. PLEASE NOTE CHANGE IN TABLET DOSE. 90 tablet 3  . metFORMIN (GLUCOPHAGE-XR) 500 MG 24 hr tablet Take 1 tablet (500 mg total) by mouth daily with breakfast. 90 tablet 3  . potassium chloride (KLOR-CON) 10 MEQ tablet Take 1 tablet (10 mEq total) by  mouth daily. 90 tablet 3  . pravastatin (PRAVACHOL) 20 MG tablet TAKE 1 TABLET BY MOUTH EVERY DAY IN THE EVENING (Patient taking differently: Take 20 mg by mouth daily. ) 90 tablet 3  . prochlorperazine (COMPAZINE) 10 MG tablet Take 1 tablet (10 mg total) by mouth every 6 (six) hours as needed for nausea or vomiting. 30 tablet 0   No current facility-administered medications for this visit.     Allergies:  Allergies  Allergen Reactions  . Aspirin Nausea Only    Patient stated,"I get nauseated with higher doses of Aspirin."    Past Medical History, Surgical history, Social history, and Family History updated without any changes.  Physical Exam:    Blood pressure 128/86, pulse 84, temperature 98.2 F (36.8 C), temperature source Temporal, resp. rate 18, height 5\' 10"  (1.778 m), weight 210 lb 14.4 oz (95.7 kg), SpO2 100 %.       ECOG: 1    General appearance: Comfortable appearing without any discomfort Head: Normocephalic without any trauma Oropharynx: Mucous membranes are moist and pink without any thrush or ulcers. Eyes: Pupils are equal and round reactive to light. Lymph nodes: No cervical, supraclavicular, inguinal or axillary lymphadenopathy.   Heart:regular rate and rhythm.  S1 and S2 without leg edema. Lung: Clear without any rhonchi or wheezes.  No dullness to percussion. Abdomin: Soft, nontender, nondistended with good bowel sounds.  No hepatosplenomegaly. Musculoskeletal: No joint deformity or effusion.  Full range of motion noted. Neurological: No deficits noted on motor, sensory and deep tendon reflex exam. Skin: No petechial rash or dryness.  Appeared moist.           Lab Results: Lab Results  Component Value Date   WBC 7.6 08/03/2019   HGB 11.9 (L) 08/03/2019   HCT 34.5 (L) 08/03/2019   MCV 88.0 08/03/2019   PLT 270 08/03/2019     Chemistry      Component Value Date/Time   NA 137 07/13/2019 0936   NA 141 08/12/2017 1258   K 3.2 (L)  07/13/2019 0936   K 3.4 (L) 08/12/2017 1258   CL 103 07/13/2019 0936   CO2 22 07/13/2019 0936   CO2 27 08/12/2017 1258   BUN 17 07/13/2019 0936   BUN 14.9 08/12/2017 1258   CREATININE 1.12 07/13/2019 0936   CREATININE 1.0 08/12/2017 1258      Component Value Date/Time   CALCIUM 9.5 07/13/2019 0936   CALCIUM 9.5 08/12/2017 1258   ALKPHOS 112 07/13/2019 0936   ALKPHOS 62 08/12/2017 1258   AST 20 07/13/2019 0936   AST 36 (H) 08/12/2017 1258   ALT 22 07/13/2019 0936   ALT 43 08/12/2017 1258   BILITOT 0.4 07/13/2019 0936   BILITOT 0.34 08/12/2017 1258        Results for NESTA, CUDE I (MRN [id number]) as of 08/03/2019 11:20  Ref. Range 06/27/2019 11:46 07/13/2019 09:36  Prostate Specific  Ag, Serum Latest Ref Range: 0.0 - 4.0 ng/mL 54.6 (H) 61.1 (H)       Impression and Plan:   83 year old man with:  1.  Castration-resistant prostate cancer with pelvic adenopathy diagnosed in 2018.    He is currently on Taxotere chemotherapy and received the first cycle 3 weeks ago.  He is here for the second cycle of therapy.  Risks and benefits of continuing systemic chemotherapy was reviewed.  Complications that include nausea, vomiting, myelosuppression and neuropathy were reiterated.  He is agreeable to continue at this time.  2. Androgen deprivation therapy: I recommend continuing this indefinitely which she is receiving under the care of Dr. Diona Fanti.  3. Bone directed therapy: Is currently on calcium and vitamin D supplements without any issues.  Delton See is deferred for the time being.  4.  Prognosis and goals of care: Therapy remains palliative at this time low risk measures are warranted given his excellent performance status.  5.  Hypertension: His blood pressure is within normal range at this time.  6.  IV access: Peripheral veins are currently in use at this time.  Risks and benefits of Port-A-Cath insertion was reviewed.  Complications such as bleeding, thrombosis and  infection were reviewed.  He is agreeable to proceed with a Port-A-Cath insertion.  7.  Anorexia weight loss: We discussed strategies to boost his appetite including nutritional supplements and potential Megace prescription if no improvement noted.  8. Follow-up: In 3 weeks for a repeat evaluation and the next cycle of therapy.  25  minutes was spent with the patient face-to-face today.  More than 50% of time was dedicated to reviewing laboratory data, disease status update as well as complications related to his current therapy.      Zola Button, MD 12/24/202011:18 AM

## 2019-08-04 LAB — PROSTATE-SPECIFIC AG, SERUM (LABCORP): Prostate Specific Ag, Serum: 47.4 ng/mL — ABNORMAL HIGH (ref 0.0–4.0)

## 2019-08-05 ENCOUNTER — Inpatient Hospital Stay: Payer: Medicare Other

## 2019-08-05 ENCOUNTER — Other Ambulatory Visit: Payer: Self-pay

## 2019-08-05 VITALS — BP 83/62 | HR 115 | Temp 97.7°F | Resp 16

## 2019-08-05 DIAGNOSIS — Z5111 Encounter for antineoplastic chemotherapy: Secondary | ICD-10-CM | POA: Diagnosis not present

## 2019-08-05 DIAGNOSIS — C61 Malignant neoplasm of prostate: Secondary | ICD-10-CM

## 2019-08-05 MED ORDER — PEGFILGRASTIM INJECTION 6 MG/0.6ML ~~LOC~~
6.0000 mg | PREFILLED_SYRINGE | Freq: Once | SUBCUTANEOUS | Status: AC
  Administered 2019-08-05: 09:00:00 6 mg via SUBCUTANEOUS

## 2019-08-05 MED ORDER — PEGFILGRASTIM INJECTION 6 MG/0.6ML ~~LOC~~
PREFILLED_SYRINGE | SUBCUTANEOUS | Status: AC
  Filled 2019-08-05: qty 0.6

## 2019-08-05 NOTE — Patient Instructions (Signed)

## 2019-08-07 ENCOUNTER — Telehealth: Payer: Self-pay | Admitting: Oncology

## 2019-08-07 ENCOUNTER — Telehealth: Payer: Self-pay

## 2019-08-07 NOTE — Telephone Encounter (Signed)
Called patient and informed him of information listed below. Patient verbalized understanding. Patient will call office if they have any questions.

## 2019-08-07 NOTE — Telephone Encounter (Signed)
-----   Message from Wyatt Portela, MD sent at 08/07/2019  8:05 AM EST ----- Please let him know him know his PSA is down.

## 2019-08-07 NOTE — Telephone Encounter (Signed)
Scheduled appt per 12/24 los.  Patient will get an updated calendar at the next scheduled appt.

## 2019-08-15 ENCOUNTER — Other Ambulatory Visit: Payer: Self-pay | Admitting: Radiology

## 2019-08-16 ENCOUNTER — Other Ambulatory Visit: Payer: Self-pay | Admitting: Oncology

## 2019-08-16 ENCOUNTER — Other Ambulatory Visit: Payer: Self-pay

## 2019-08-16 ENCOUNTER — Encounter (HOSPITAL_COMMUNITY): Payer: Self-pay

## 2019-08-16 ENCOUNTER — Ambulatory Visit (HOSPITAL_COMMUNITY)
Admission: RE | Admit: 2019-08-16 | Discharge: 2019-08-16 | Disposition: A | Discharge status: Home / Self Care | Payer: Medicare Other | Source: Ambulatory Visit | Attending: Oncology | Admitting: Oncology

## 2019-08-16 DIAGNOSIS — Z79899 Other long term (current) drug therapy: Secondary | ICD-10-CM | POA: Diagnosis not present

## 2019-08-16 DIAGNOSIS — E7800 Pure hypercholesterolemia, unspecified: Secondary | ICD-10-CM | POA: Insufficient documentation

## 2019-08-16 DIAGNOSIS — Z87891 Personal history of nicotine dependence: Secondary | ICD-10-CM | POA: Insufficient documentation

## 2019-08-16 DIAGNOSIS — Z452 Encounter for adjustment and management of vascular access device: Secondary | ICD-10-CM | POA: Diagnosis not present

## 2019-08-16 DIAGNOSIS — K219 Gastro-esophageal reflux disease without esophagitis: Secondary | ICD-10-CM | POA: Insufficient documentation

## 2019-08-16 DIAGNOSIS — C61 Malignant neoplasm of prostate: Secondary | ICD-10-CM

## 2019-08-16 DIAGNOSIS — I10 Essential (primary) hypertension: Secondary | ICD-10-CM | POA: Insufficient documentation

## 2019-08-16 DIAGNOSIS — G4733 Obstructive sleep apnea (adult) (pediatric): Secondary | ICD-10-CM | POA: Diagnosis not present

## 2019-08-16 DIAGNOSIS — Z7984 Long term (current) use of oral hypoglycemic drugs: Secondary | ICD-10-CM | POA: Insufficient documentation

## 2019-08-16 HISTORY — PX: IR IMAGING GUIDED PORT INSERTION: IMG5740

## 2019-08-16 LAB — CBC WITH DIFFERENTIAL/PLATELET
Abs Immature Granulocytes: 2.73 10*3/uL — ABNORMAL HIGH (ref 0.00–0.07)
Basophils Absolute: 0 10*3/uL (ref 0.0–0.1)
Basophils Relative: 0 %
Eosinophils Absolute: 0 10*3/uL (ref 0.0–0.5)
Eosinophils Relative: 0 %
HCT: 34.1 % — ABNORMAL LOW (ref 39.0–52.0)
Hemoglobin: 11.2 g/dL — ABNORMAL LOW (ref 13.0–17.0)
Immature Granulocytes: 9 %
Lymphocytes Relative: 16 %
Lymphs Abs: 4.7 10*3/uL — ABNORMAL HIGH (ref 0.7–4.0)
MCH: 30 pg (ref 26.0–34.0)
MCHC: 32.8 g/dL (ref 30.0–36.0)
MCV: 91.4 fL (ref 80.0–100.0)
Monocytes Absolute: 1.8 10*3/uL — ABNORMAL HIGH (ref 0.1–1.0)
Monocytes Relative: 6 %
Neutro Abs: 21 10*3/uL — ABNORMAL HIGH (ref 1.7–7.7)
Neutrophils Relative %: 69 %
Platelets: 143 10*3/uL — ABNORMAL LOW (ref 150–400)
RBC: 3.73 MIL/uL — ABNORMAL LOW (ref 4.22–5.81)
RDW: 15.9 % — ABNORMAL HIGH (ref 11.5–15.5)
WBC: 30.4 10*3/uL — ABNORMAL HIGH (ref 4.0–10.5)
nRBC: 0.3 % — ABNORMAL HIGH (ref 0.0–0.2)

## 2019-08-16 LAB — BASIC METABOLIC PANEL
Anion gap: 14 (ref 5–15)
BUN: 20 mg/dL (ref 8–23)
CO2: 21 mmol/L — ABNORMAL LOW (ref 22–32)
Calcium: 9.1 mg/dL (ref 8.9–10.3)
Chloride: 102 mmol/L (ref 98–111)
Creatinine, Ser: 1.43 mg/dL — ABNORMAL HIGH (ref 0.61–1.24)
GFR calc Af Amer: 53 mL/min — ABNORMAL LOW (ref >60)
GFR calc non Af Amer: 46 mL/min — ABNORMAL LOW (ref >60)
Glucose, Bld: 117 mg/dL — ABNORMAL HIGH (ref 70–99)
Potassium: 3.2 mmol/L — ABNORMAL LOW (ref 3.5–5.1)
Sodium: 137 mmol/L (ref 135–145)

## 2019-08-16 LAB — GLUCOSE, CAPILLARY: Glucose-Capillary: 112 mg/dL — ABNORMAL HIGH (ref 70–99)

## 2019-08-16 LAB — PROTIME-INR
INR: 1.1 (ref 0.8–1.2)
Prothrombin Time: 14.3 seconds (ref 11.4–15.2)

## 2019-08-16 MED ORDER — HEPARIN SOD (PORK) LOCK FLUSH 100 UNIT/ML IV SOLN
INTRAVENOUS | Status: AC
  Filled 2019-08-16: qty 5

## 2019-08-16 MED ORDER — LIDOCAINE-EPINEPHRINE (PF) 1 %-1:200000 IJ SOLN
INTRAMUSCULAR | Status: AC | PRN
  Administered 2019-08-16: 10 mL

## 2019-08-16 MED ORDER — FENTANYL CITRATE (PF) 100 MCG/2ML IJ SOLN
INTRAMUSCULAR | Status: AC
  Filled 2019-08-16: qty 2

## 2019-08-16 MED ORDER — MIDAZOLAM HCL 2 MG/2ML IJ SOLN
INTRAMUSCULAR | Status: AC
  Filled 2019-08-16: qty 4

## 2019-08-16 MED ORDER — FENTANYL CITRATE (PF) 100 MCG/2ML IJ SOLN
INTRAMUSCULAR | Status: AC | PRN
  Administered 2019-08-16 (×2): 50 ug via INTRAVENOUS

## 2019-08-16 MED ORDER — CEFAZOLIN SODIUM-DEXTROSE 2-4 GM/100ML-% IV SOLN
INTRAVENOUS | Status: AC
  Administered 2019-08-16: 12:00:00 2 g via INTRAVENOUS
  Filled 2019-08-16: qty 100

## 2019-08-16 MED ORDER — HEPARIN SOD (PORK) LOCK FLUSH 100 UNIT/ML IV SOLN
INTRAVENOUS | Status: AC | PRN
  Administered 2019-08-16: 500 [IU] via INTRAVENOUS

## 2019-08-16 MED ORDER — LIDOCAINE HCL 1 % IJ SOLN
INTRAMUSCULAR | Status: AC
  Filled 2019-08-16: qty 20

## 2019-08-16 MED ORDER — SODIUM CHLORIDE 0.9 % IV SOLN: INTRAVENOUS | Status: DC

## 2019-08-16 MED ORDER — MIDAZOLAM HCL 2 MG/2ML IJ SOLN
INTRAMUSCULAR | Status: AC | PRN
  Administered 2019-08-16 (×2): 1 mg via INTRAVENOUS

## 2019-08-16 MED ORDER — CEFAZOLIN SODIUM-DEXTROSE 2-4 GM/100ML-% IV SOLN: 2.0000 g | INTRAVENOUS | Status: AC

## 2019-08-16 NOTE — Procedures (Signed)
Interventional Radiology Procedure Note  Procedure: Placement of a right IJ approach single lumen PowerPort.  Tip is positioned at the superior cavoatrial junction and catheter is ready for immediate use.  Complications: None Recommendations:  - Ok to shower tomorrow - Do not submerge for 7 days - Routine line care   Signed,  Chandler Stofer S. Pradeep Beaubrun, DO   

## 2019-08-16 NOTE — Consult Note (Signed)
Chief Complaint: Patient was seen in consultation today for Port-A-Cath placement  Referring Physician(s): Wyatt Portela  Supervising Physician: Corrie Mckusick  Patient Status: [facility] - Out-pt  History of Present Illness: [name] is an 84 y.o. male with history of advanced/castration resistant prostate cancer who presents today for Port-A-Cath placement for additional treatment.  Additional medical history as below.  Past Medical History:  Diagnosis Date  . Adenomatous colon polyp 12/14/2005   5 mm polyp in descending colon, found on colonoscopy by Dr. Wilford Corner  . Allergic rhinitis   . Asymptomatic cholelithiasis 11/30/2014  . Bladder calculi   . Bladder cancer ([place]) 2010   S/P TURBT   . Bladder neck contracture   . Enlarged heart    "slight" (09/09/2016)  . GERD (gastroesophageal reflux disease)   . Hand eczema   . Hearing loss 11/30/2016  . Hepatitis, unspecified PT STATES HAD HEPATITIS APPROX 1980'S ; UNSURE WHAT TYPE   Hepatitis C RNA Quant on July 06, 2007 showed no detectable virus.  . High cholesterol   . History of kidney stones   . Hypertension   . Nocturia   . OSA on CPAP    Severe by diagnostic polysomnogram 06/22/2004.  Marland Kitchen Prostate cancer ([place]) 2009   S/P EXTERNAL RADIATION, SEED IMPLANTS  . Self-catheterizes urinary bladder    "q couple weeks prn" (09/09/2016)  . Sleep apnea    "don't use the mask anymore" (09/09/2016)    Past Surgical History:  Procedure Laterality Date  . CARDIOVASCULAR STRESS TEST  07-14-2011  DR NISHAN   NORMAL NUCLEAR STUDY/ EF 62%  . CIRCUMCISION N/A 04/08/2015   Procedure: CIRCUMCISION ADULT;  Surgeon: Franchot Gallo, MD;  Location: [facility];  Service: Urology;  Laterality: N/A;  . CYSTOSCOPY WITH LITHOLAPAXY N/A 10/24/2012   Procedure: CYSTOSCOPY WITH LITHOLAPAXY;  Surgeon: Franchot Gallo, MD;  Location: [facility];  Service: Urology;  Laterality: N/A;  TRANSURETHRAL RESECTION OF BLADDER NECK  CONTRACTURE WITH COLLINS KNIFE     . CYSTOSCOPY WITH URETHRAL DILATATION N/A 04/08/2015   Procedure: CYSTOSCOPY WITH BALLOON DILATATION OF BLADDER NECK CONTRACTURE;  Surgeon: Franchot Gallo, MD;  Location: [facility];  Service: Urology;  Laterality: N/A;  . KNEE ARTHROSCOPY Bilateral 1970's  . RADIOACTIVE PROSTATE SEED IMPLANTS  04-11-2008  . TRANSURETHRAL RESECTION OF BLADDER NECK  12-19-2008  . TRANSURETHRAL RESECTION OF BLADDER TUMOR  02-19-2009   AND TUR BLADDER NECK    Allergies: Aspirin  Medications: Prior to Admission medications   Medication Sig Start Date End Date Taking? Authorizing Provider  amLODipine (NORVASC) 10 MG tablet TAKE 1 TABLET (10 MG TOTAL) DAILY BY MOUTH. 08/22/18  Yes [name], MD  chlorthalidone (HYGROTON) 50 MG tablet Take 1 tablet (50 mg total) by mouth daily. 10/12/18  Yes [name], MD  Hypromellose (ARTIFICIAL TEARS OP) Place 1 drop into the left eye daily.   Yes [provider]  KLOR-CON M10 10 MEQ tablet TAKE 1 TABLET BY MOUTH EVERY DAY Patient taking differently: Take 10 mEq by mouth daily.  12/15/18  Yes Annia Belt, MD  losartan (COZAAR) 100 MG tablet TAKE 1 TABLET (100 MG TOTAL) BY MOUTH DAILY. PLEASE NOTE CHANGE IN TABLET DOSE. 04/12/19  Yes [name], MD  metFORMIN (GLUCOPHAGE-XR) 500 MG 24 hr tablet Take 1 tablet (500 mg total) by mouth daily with breakfast. 06/12/19  Yes [name], MD  potassium chloride (KLOR-CON) 10 MEQ tablet Take 1 tablet (10 mEq total) by  mouth daily. 05/22/19  Yes [name], MD  pravastatin (PRAVACHOL) 20 MG tablet TAKE 1 TABLET BY MOUTH EVERY DAY IN THE EVENING Patient taking differently: Take 20 mg by mouth daily.  12/12/18  Yes Aldine Contes, MD  alendronate (FOSAMAX) 70 MG tablet Take 70 mg by mouth once a week.  12/05/18   [provider]  fluticasone (FLONASE) 50 MCG/ACT nasal spray PLACE 1 SPRAY INTO BOTH NOSTRILS DAILY. 03/21/18  03/21/19  [name], MD  ILEVRO 0.3 % ophthalmic suspension Place 1 drop into the right eye daily.  01/21/17   [provider]  lidocaine (LIDODERM) 5 % Place 1 patch onto the skin daily. Remove & Discard patch within 12 hours or as directed by MD 05/08/19   Rodell Perna A, PA-C  prochlorperazine (COMPAZINE) 10 MG tablet Take 1 tablet (10 mg total) by mouth every 6 (six) hours as needed for nausea or vomiting. 07/11/19   Wyatt Portela, MD     Family History  Problem Relation Age of Onset  . Heart attack Mother 27  . Cancer Neg Hx   . Diabetes Neg Hx     Social History   Socioeconomic History  . Marital status: Married    Spouse name: Not on file  . Number of children: Not on file  . Years of education: Not on file  . Highest education level: Not on file  Occupational History  . Not on file  Tobacco Use  . Smoking status: Former Smoker    Packs/day: 0.25    Years: 25.00    Pack years: 6.25    Types: Cigarettes    Quit date: 11/25/2004    Years since quitting: 14.7  . Smokeless tobacco: Never Used  Substance and Sexual Activity  . Alcohol use: No  . Drug use: No  . Sexual activity: Never  Other Topics Concern  . Not on file  Social History Narrative  . Not on file   Social Determinants of Health   Financial Resource Strain:   . Difficulty of Paying Living Expenses: Not on file  Food Insecurity:   . Worried About Charity fundraiser in the Last Year: Not on file  . Ran Out of Food in the Last Year: Not on file  Transportation Needs:   . Lack of Transportation (Medical): Not on file  . Lack of Transportation (Non-Medical): Not on file  Physical Activity:   . Days of Exercise per Week: Not on file  . Minutes of Exercise per Session: Not on file  Stress:   . Feeling of Stress : Not on file  Social Connections:   . Frequency of Communication with Friends and Family: Not on file  . Frequency of Social Gatherings with Friends and Family: Not on file    . Attends Religious Services: Not on file  . Active Member of Clubs or Organizations: Not on file  . Attends Archivist Meetings: Not on file  . Marital Status: Not on file     Review of Systems denies fever, headache, chest pain, dyspnea, cough, abdominal/back pain, nausea, vomiting or bleeding.  He is hard of hearing  Vital Signs: BP 121/75   Pulse 99   Temp (!) 97.3 F (36.3 C) (Oral)   Resp 18   Ht 5' 10.5" (1.791 m)   Wt 208 lb 6.4 oz (94.5 kg)   SpO2 100%   BMI 29.48 kg/m   Physical Exam awake, alert.  Chest clear to  auscultation bilaterally.  Heart with regular rate and rhythm.  Abdomen soft, positive bowel sounds, nontender.  No lower extremity edema.  Imaging: No results found.  Labs:  CBC: Recent Labs    06/27/19 1146 07/13/19 0936 08/03/19 1101 08/16/19 1018  WBC 4.9 6.5 7.6 PENDING  HGB 14.7 13.7 11.9* 11.2*  HCT 42.4 39.5 34.5* 34.1*  PLT 281 337 270 143*    COAGS: Recent Labs    08/16/19 1018  INR 1.1    BMP: Recent Labs    05/08/19 1739 06/27/19 1146 07/13/19 0936 08/03/19 1101  NA 136 138 137 138  K 2.8* 3.6 3.2* 3.1*  CL 102 102 103 105  CO2 22 22 22  21*  GLUCOSE 125* 94 104* 107*  BUN 21 15 17 19   CALCIUM 8.9 9.6 9.5 9.3  CREATININE 1.22 1.02 1.12 1.14  GFRNONAA 55* >60 >60 60*  GFRAA >60 >60 >60 >60    LIVER FUNCTION TESTS: Recent Labs    04/25/19 0809 06/27/19 1146 07/13/19 0936 08/03/19 1101  BILITOT 0.6 0.4 0.4 0.6  AST 20 23 20 24   ALT 23 25 22 20   ALKPHOS 76 98 112 128*  PROT 8.2* 8.6* 8.3* 8.0  ALBUMIN 3.8 4.0 3.6 3.7    TUMOR MARKERS: No results for input(s): AFPTM, CEA, CA199, CHROMGRNA in the last 8760 hours.  Assessment and Plan: 84 y.o. male with history of advanced/castration resistant prostate cancer who presents today for Port-A-Cath placement for additional treatment. Risks and benefits of image guided port-a-catheter placement was discussed with the patient including, but not limited  to bleeding, infection, pneumothorax, or fibrin sheath development and need for additional procedures.  All of the patient's questions were answered, patient is agreeable to proceed. Consent signed and in chart.     Thank you for this interesting consult.  I greatly enjoyed meeting DOMNIC BRASWELL and look forward to participating in their care.  A copy of this report was sent to the requesting provider on this date.  Electronically Signed: D. Rowe Robert, PA-C 08/16/2019, 10:42 AM   I spent a total of 25 minutes    in face to face in clinical consultation, greater than 50% of which was counseling/coordinating care for Port-A-Cath placement

## 2019-08-16 NOTE — Discharge Instructions (Addendum)
Moderate Conscious Sedation, Adult, Care After These instructions provide you with information about caring for yourself after your procedure. Your health care provider may also give you more specific instructions. Your treatment has been planned according to current medical practices, but problems sometimes occur. Call your health care provider if you have any problems or questions after your procedure. What can I expect after the procedure? After your procedure, it is common:  To feel sleepy for several hours.  To feel clumsy and have poor balance for several hours.  To have poor judgment for several hours.  To vomit if you eat too soon. Follow these instructions at home: For at least 24 hours after the procedure:   Do not: ? Participate in activities where you could fall or become injured. ? Drive. ? Use heavy machinery. ? Drink alcohol. ? Take sleeping pills or medicines that cause drowsiness. ? Make important decisions or sign legal documents. ? Take care of children on your own.  Rest. Eating and drinking  Follow the diet recommended by your health care provider.  If you vomit: ? Drink water, juice, or soup when you can drink without vomiting. ? Make sure you have little or no nausea before eating solid foods. General instructions  Have a responsible adult stay with you until you are awake and alert.  Take over-the-counter and prescription medicines only as told by your health care provider.  If you smoke, do not smoke without supervision.  Keep all follow-up visits as told by your health care provider. This is important. Contact a health care provider if:  You keep feeling nauseous or you keep vomiting.  You feel light-headed.  You develop a rash.  You have a fever. Get help right away if:  You have trouble breathing. This information is not intended to replace advice given to you by your health care provider. Make sure you discuss any questions you have  with your health care provider. Document Revised: 07/09/2017 Document Reviewed: 11/16/2015 Elsevier Patient Education  [place] Insertion, Care After This sheet gives you information about how to care for yourself after your procedure. Your health care provider may also give you more specific instructions. If you have problems or questions, contact your health care provider. What can I expect after the procedure? After the procedure, it is common to have:  Discomfort at the port insertion site.  Bruising on the skin over the port. This should improve over 3-4 days. Follow these instructions at home: [facility] care  After your port is placed, you will get a manufacturer's information card. The card has information about your port. Keep this card with you at all times.  Take care of the port as told by your health care provider. Ask your health care provider if you or a family member can get training for taking care of the port at home. A home health care nurse may also take care of the port.  Make sure to remember what type of port you have. Incision care      Follow instructions from your health care provider about how to take care of your port insertion site. Make sure you: ? Wash your hands with soap and water before and after you change your bandage (dressing). If soap and water are not available, use hand sanitizer. ? Change your dressing as told by your health care provider. ? Leave stitches (sutures), skin glue, or adhesive strips in place. These skin closures may need to stay  in place for 2 weeks or longer. If adhesive strip edges start to loosen and curl up, you may trim the loose edges. Do not remove adhesive strips completely unless your health care provider tells you to do that.  Check your port insertion site every day for signs of infection. Check for: ? Redness, swelling, or pain. ? Fluid or blood. ? Warmth. ? Pus or a bad smell. Activity  Return  to your normal activities as told by your health care provider. Ask your health care provider what activities are safe for you.  Do not lift anything that is heavier than 10 lb (4.5 kg), or the limit that you are told, until your health care provider says that it is safe. General instructions  Take over-the-counter and prescription medicines only as told by your health care provider.  Do not take baths, swim, or use a hot tub until your health care provider approves. Ask your health care provider if you may take showers. You may only be allowed to take sponge baths.  Do not drive for 24 hours if you were given a sedative during your procedure.  Wear a medical alert bracelet in case of an emergency. This will tell any health care providers that you have a port.  Keep all follow-up visits as told by your health care provider. This is important. Contact a health care provider if:  You cannot flush your port with saline as directed, or you cannot draw blood from the port.  You have a fever or chills.  You have redness, swelling, or pain around your port insertion site.  You have fluid or blood coming from your port insertion site.  Your port insertion site feels warm to the touch.  You have pus or a bad smell coming from the port insertion site. Get help right away if:  You have chest pain or shortness of breath.  You have bleeding from your port that you cannot control. Summary  Take care of the port as told by your health care provider. Keep the manufacturer's information card with you at all times.  Change your dressing as told by your health care provider.  Contact a health care provider if you have a fever or chills or if you have redness, swelling, or pain around your port insertion site.  Keep all follow-up visits as told by your health care provider. This information is not intended to replace advice given to you by your health care provider. Make sure you discuss any  questions you have with your health care provider. Document Revised: 02/22/2018 Document Reviewed: 02/22/2018 Elsevier Patient Education  [place].     May remove dressing tomorrow after 1200, shower, pat area dry, you do not have have to put anything back on the areas. The skin glue with come off after about 2 weeks. You can not use any elma or lidocaine cream until the skin glue has come completely off. Tell everyone that you have a port, so if they can use the port they will. I recommend wearing a button up shirt to the cancer center, easier to access the port.

## 2019-08-21 ENCOUNTER — Encounter: Payer: Self-pay | Admitting: Student in an Organized Health Care Education/Training Program

## 2019-08-21 ENCOUNTER — Ambulatory Visit (INDEPENDENT_AMBULATORY_CARE_PROVIDER_SITE_OTHER): Payer: Medicare Other | Admitting: Student in an Organized Health Care Education/Training Program

## 2019-08-21 VITALS — BP 115/64 | HR 94 | Temp 97.8°F | Ht 70.0 in | Wt 208.6 lb

## 2019-08-21 DIAGNOSIS — Z7189 Other specified counseling: Secondary | ICD-10-CM | POA: Diagnosis not present

## 2019-08-21 DIAGNOSIS — R634 Abnormal weight loss: Secondary | ICD-10-CM

## 2019-08-21 DIAGNOSIS — I10 Essential (primary) hypertension: Secondary | ICD-10-CM

## 2019-08-21 DIAGNOSIS — C61 Malignant neoplasm of prostate: Secondary | ICD-10-CM

## 2019-08-21 DIAGNOSIS — Z7984 Long term (current) use of oral hypoglycemic drugs: Secondary | ICD-10-CM

## 2019-08-21 DIAGNOSIS — E1169 Type 2 diabetes mellitus with other specified complication: Secondary | ICD-10-CM | POA: Diagnosis not present

## 2019-08-21 DIAGNOSIS — Z6829 Body mass index (BMI) 29.0-29.9, adult: Secondary | ICD-10-CM

## 2019-08-21 DIAGNOSIS — Z95828 Presence of other vascular implants and grafts: Secondary | ICD-10-CM

## 2019-08-21 DIAGNOSIS — Z79899 Other long term (current) drug therapy: Secondary | ICD-10-CM

## 2019-08-21 LAB — POCT GLYCOSYLATED HEMOGLOBIN (HGB A1C): Hemoglobin A1C: 5.8 % — AB (ref 4.0–5.6)

## 2019-08-21 LAB — GLUCOSE, CAPILLARY: Glucose-Capillary: 121 mg/dL — ABNORMAL HIGH (ref 70–99)

## 2019-08-21 NOTE — Assessment & Plan Note (Signed)
Blood pressure normal today.  I noticed at an infusion visits in late December he had hypotension with a blood pressure of 83/60.  He is also having some fatigue when getting up in the morning, a little worsening of his creatinine on his last oncology labs, and persistent hypokalemia.  I think at this point given his progressing prostate cancer we do not need tight control of his blood pressure.  Plan is to continue with amlodipine 10 mg daily.  Discontinue chlorthalidone and discontinue losartan, hopefully that will improve renal function, hyperkalemia, and may benefit some of his orthostatic fatigue symptoms.

## 2019-08-21 NOTE — Progress Notes (Signed)
   Assessment and Plan:  See Encounters tab for problem-based medical decision making.   __________________________________________________________  HPI:   84 year old person living with advanced prostate cancer here for follow-up of hypertension and diabetes.  I last saw the patient in October when he was doing fairly well.  Over the last 3 months he has had worsening of his exertional fatigue as well as food aversion, loss of appetite, and unintentional weight loss.  He has been working with his oncologist, Dr. Alen Blew, and found to have progression of his prostate cancer.  In December he started on a new regimen of palliative chemotherapy with, in the middle now of 4 cycles of Taxotere with dexamethasone.  He has not had too much side effects like nausea, vomiting, or neuropathy.  He did have a caustic reaction with his peripheral IV and so just last week had a Port-A-Cath placed on his right chest with interventional radiology.  He had family moving into his house to help with his activities of daily living.  He reports that having extra support is helpful, though he is frustrated by using his privacy.  Still has a lot of support from his wife.  Finding a lot of fatigue throughout the day, struggles to complete simple tasks now.  No other pain.  No shortness of breath, no chills, no fevers.  He has had a nagging cough with scant sputum that has only been mild for about 2 weeks.  No sick contacts.  __________________________________________________________  Problem List: Patient Active Problem List   Diagnosis Date Noted  . Advanced directives, counseling/discussion 08/21/2019    Priority: High  . Prostate cancer metastatic to bone ([place]) 04/03/2009    Priority: High  . Essential hypertension 07/09/2006    Priority: High  . Urinary incontinence 01/16/2019    Priority: Medium  . Type 2 diabetes mellitus with other specified complication ([place]) 0000000    Priority: Medium  . Hallux valgus  of right foot 01/16/2019    Priority: Low  . Sensorineural hearing loss (SNHL) of both ears 06/28/2017    Priority: Low  . Hyperlipidemia 05/20/2015    Priority: Low  . Obstructive sleep apnea 07/06/2007    Priority: Low  . Allergic rhinitis 07/09/2006    Priority: Low  . Osteoarthritis of left knee 07/09/2006    Priority: Low  . Prostate cancer ([place]) 07/04/2019  . Weight loss 05/22/2019    Medications: Reconciled today in Epic __________________________________________________________  Physical Exam:  Vital Signs: Vitals:   08/21/19 0906  BP: 115/64  Pulse: 94  Temp: 97.8 F (36.6 C)  TempSrc: Oral  SpO2: 100%  Weight: 208 lb 9.6 oz (94.6 kg)  Height: 5\' 10"  (1.778 m)    Gen: Tired and thin appearing, clearly losing weight since last time I saw him Lymph: No cervical or axillary lymphadenopathy Chest: Right-sided Port-A-Cath placed, incision over the right IJ is healing well CV: RRR, no murmurs Pulm: Normal effort, CTA throughout, no wheezing Ext: Warm, no edema, normal joints

## 2019-08-21 NOTE — Patient Instructions (Signed)
Today we talked about more blood pressure, diabetes, and prostate cancer.  It seems like your prostate cancer is getting worse.  We talked about your advanced directives and completed an advanced care plan.  I printed you a copy of this.  Your diabetes is under excellent control.  Your blood sugars are a little low.  We can stop Metformin.  Your blood pressure is also a little low.  This probably comes from your recent weight loss.  We can stop the chlorthalidone and losartan.  I have printed you a current medication list.  Please take this home and review it with all of your pill bottles.  Call me if you have any questions.

## 2019-08-21 NOTE — Assessment & Plan Note (Addendum)
Discussed advanced care directives.  Patient values are for palliative treatment of his prostate cancer, wants as much quality of life as possible times at home with family.  DNR/DNI, would not want CPR, intubations, or other invasive measures.  Would still accept hospitalization for supportive care including IV fluids and IV antibiotics.  Will still work with oncology for palliative chemotherapy.  MOST form and advanced care planning was completed today between me and the patient, documents can be viewed under [facility].

## 2019-08-21 NOTE — Assessment & Plan Note (Signed)
Hemoglobin A1c again is low at 5.8%.  I think given his unintentional weight loss and advancing prostate cancer he was time to discontinue out low-dose Metformin 500 mg.  We will follow-up his blood sugars in 3 months but I do not anticipate they will be uncontrolled, especially given his reduction in oral intake.  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

## 2019-08-21 NOTE — Assessment & Plan Note (Signed)
Patient now continues with unintentional weight loss, has lost another 14 pounds in the last 3 months.  This is a side effect of he is progressing prostate cancer and now on chemotherapy.  He has dietary supplements at home.  We talked about the natural progression of this cancer and how this is an expected complication.

## 2019-08-22 DIAGNOSIS — R9431 Abnormal electrocardiogram [ECG] [EKG]: Secondary | ICD-10-CM | POA: Diagnosis not present

## 2019-08-22 DIAGNOSIS — R109 Unspecified abdominal pain: Secondary | ICD-10-CM | POA: Diagnosis not present

## 2019-08-22 DIAGNOSIS — Z79899 Other long term (current) drug therapy: Secondary | ICD-10-CM | POA: Diagnosis not present

## 2019-08-22 DIAGNOSIS — Z886 Allergy status to analgesic agent status: Secondary | ICD-10-CM | POA: Diagnosis not present

## 2019-08-22 DIAGNOSIS — G9389 Other specified disorders of brain: Secondary | ICD-10-CM | POA: Diagnosis not present

## 2019-08-22 DIAGNOSIS — R05 Cough: Secondary | ICD-10-CM | POA: Diagnosis not present

## 2019-08-22 DIAGNOSIS — R5383 Other fatigue: Secondary | ICD-10-CM | POA: Diagnosis not present

## 2019-08-22 DIAGNOSIS — Z85830 Personal history of malignant neoplasm of bone: Secondary | ICD-10-CM | POA: Diagnosis not present

## 2019-08-22 DIAGNOSIS — Z20822 Contact with and (suspected) exposure to covid-19: Secondary | ICD-10-CM | POA: Diagnosis not present

## 2019-08-22 DIAGNOSIS — Z7984 Long term (current) use of oral hypoglycemic drugs: Secondary | ICD-10-CM | POA: Diagnosis not present

## 2019-08-22 DIAGNOSIS — I10 Essential (primary) hypertension: Secondary | ICD-10-CM | POA: Diagnosis not present

## 2019-08-22 DIAGNOSIS — J9811 Atelectasis: Secondary | ICD-10-CM | POA: Diagnosis not present

## 2019-08-22 DIAGNOSIS — R509 Fever, unspecified: Secondary | ICD-10-CM | POA: Diagnosis not present

## 2019-08-22 DIAGNOSIS — K5730 Diverticulosis of large intestine without perforation or abscess without bleeding: Secondary | ICD-10-CM | POA: Diagnosis not present

## 2019-08-23 DIAGNOSIS — R109 Unspecified abdominal pain: Secondary | ICD-10-CM | POA: Diagnosis not present

## 2019-08-23 DIAGNOSIS — R509 Fever, unspecified: Secondary | ICD-10-CM | POA: Diagnosis not present

## 2019-08-23 DIAGNOSIS — K5730 Diverticulosis of large intestine without perforation or abscess without bleeding: Secondary | ICD-10-CM | POA: Diagnosis not present

## 2019-08-24 ENCOUNTER — Inpatient Hospital Stay: Payer: Medicare Other

## 2019-08-24 ENCOUNTER — Other Ambulatory Visit: Payer: Self-pay | Admitting: Student in an Organized Health Care Education/Training Program

## 2019-08-24 ENCOUNTER — Inpatient Hospital Stay (HOSPITAL_BASED_OUTPATIENT_CLINIC_OR_DEPARTMENT_OTHER): Payer: Medicare Other | Admitting: Oncology

## 2019-08-24 ENCOUNTER — Other Ambulatory Visit: Payer: Self-pay

## 2019-08-24 ENCOUNTER — Inpatient Hospital Stay: Payer: Medicare Other | Attending: Oncology

## 2019-08-24 ENCOUNTER — Telehealth: Payer: Self-pay | Admitting: Oncology

## 2019-08-24 VITALS — BP 88/69 | HR 82

## 2019-08-24 VITALS — BP 86/72 | HR 80 | Temp 97.7°F | Resp 19 | Ht 70.0 in | Wt 205.0 lb

## 2019-08-24 DIAGNOSIS — C61 Malignant neoplasm of prostate: Secondary | ICD-10-CM

## 2019-08-24 DIAGNOSIS — C7951 Secondary malignant neoplasm of bone: Secondary | ICD-10-CM | POA: Diagnosis not present

## 2019-08-24 DIAGNOSIS — I10 Essential (primary) hypertension: Secondary | ICD-10-CM | POA: Diagnosis not present

## 2019-08-24 LAB — CMP (CANCER CENTER ONLY)
ALT: 22 U/L (ref 0–44)
AST: 31 U/L (ref 15–41)
Albumin: 3.2 g/dL — ABNORMAL LOW (ref 3.5–5.0)
Alkaline Phosphatase: 102 U/L (ref 38–126)
Anion gap: 14 (ref 5–15)
BUN: 29 mg/dL — ABNORMAL HIGH (ref 8–23)
CO2: 19 mmol/L — ABNORMAL LOW (ref 22–32)
Calcium: 8.4 mg/dL — ABNORMAL LOW (ref 8.9–10.3)
Chloride: 104 mmol/L (ref 98–111)
Creatinine: 1.78 mg/dL — ABNORMAL HIGH (ref 0.61–1.24)
GFR, Est AFR Am: 41 mL/min — ABNORMAL LOW (ref >60)
GFR, Estimated: 35 mL/min — ABNORMAL LOW (ref >60)
Glucose, Bld: 139 mg/dL — ABNORMAL HIGH (ref 70–99)
Potassium: 3.2 mmol/L — ABNORMAL LOW (ref 3.5–5.1)
Sodium: 137 mmol/L (ref 135–145)
Total Bilirubin: 0.7 mg/dL (ref 0.3–1.2)
Total Protein: 7.6 g/dL (ref 6.5–8.1)

## 2019-08-24 LAB — CBC WITH DIFFERENTIAL (CANCER CENTER ONLY)
Abs Immature Granulocytes: 0.1 10*3/uL — ABNORMAL HIGH (ref 0.00–0.07)
Basophils Absolute: 0 10*3/uL (ref 0.0–0.1)
Basophils Relative: 0 %
Eosinophils Absolute: 0.2 10*3/uL (ref 0.0–0.5)
Eosinophils Relative: 2 %
HCT: 28.4 % — ABNORMAL LOW (ref 39.0–52.0)
Hemoglobin: 9.6 g/dL — ABNORMAL LOW (ref 13.0–17.0)
Immature Granulocytes: 1 %
Lymphocytes Relative: 20 %
Lymphs Abs: 2.3 10*3/uL (ref 0.7–4.0)
MCH: 30 pg (ref 26.0–34.0)
MCHC: 33.8 g/dL (ref 30.0–36.0)
MCV: 88.8 fL (ref 80.0–100.0)
Monocytes Absolute: 1.7 10*3/uL — ABNORMAL HIGH (ref 0.1–1.0)
Monocytes Relative: 15 %
Neutro Abs: 6.9 10*3/uL (ref 1.7–7.7)
Neutrophils Relative %: 62 %
Platelet Count: 251 10*3/uL (ref 150–400)
RBC: 3.2 MIL/uL — ABNORMAL LOW (ref 4.22–5.81)
RDW: 15.7 % — ABNORMAL HIGH (ref 11.5–15.5)
WBC Count: 11.2 10*3/uL — ABNORMAL HIGH (ref 4.0–10.5)
nRBC: 0 % (ref 0.0–0.2)

## 2019-08-24 MED ORDER — SODIUM CHLORIDE 0.9 % IV SOLN
INTRAVENOUS | Status: DC
  Filled 2019-08-24 (×2): qty 250

## 2019-08-24 MED ORDER — SODIUM CHLORIDE 0.9% FLUSH
10.0000 mL | Freq: Once | INTRAVENOUS | Status: AC
  Administered 2019-08-24: 10 mL via INTRAVENOUS
  Filled 2019-08-24: qty 10

## 2019-08-24 MED ORDER — HEPARIN SOD (PORK) LOCK FLUSH 100 UNIT/ML IV SOLN
500.0000 [IU] | Freq: Once | INTRAVENOUS | Status: AC
  Administered 2019-08-24: 500 [IU] via INTRAVENOUS
  Filled 2019-08-24: qty 5

## 2019-08-24 NOTE — Patient Instructions (Signed)

## 2019-08-24 NOTE — Telephone Encounter (Signed)
Scheduled appt per 1/14 los. 

## 2019-08-24 NOTE — Addendum Note (Signed)
Addended by: Wyatt Portela on: 08/24/2019 09:25 AM   Modules accepted: Orders

## 2019-08-24 NOTE — Progress Notes (Signed)
Hematology and Oncology Follow Up Visit  CONNY BERI [id number] 1936/07/17 84 y.o. 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  He presented in 2009 with localized disease and Gleason score 4+4 = 8.   Prior Therapy: He was treated with androgen deprivation as well as definitive radiation therapy. His PSA nadir was 0.5 in 2009.   He developed advanced disease with PSA in February 2015 was up to 36.35 and a fluoride PET scan showed measurable disease in the second rib as well as the 10th thoracic vertebral body. He was started on androgen deprivation since that time. He had an excellent PSA response initially with a nadir down to 0.08 in April 2016.   His PSA was 0.8 in February 2017 and Casodex was added.   In April 2017 his PSA was 0.28 and started to rise again in July 2017. His PSA was 0.44 and subsequently was 1.09 in October 2017. On 10/09/2016 his PSA was 3.39. His testosterone level was 30.4. Casodex was discontinued in April 2018. His PSA was 7.7 in August 2018.  Xtandi 160 mg daily started on 04/08/2017.  Therapy discontinued in November 2020 for progression of disease.   Current therapy:  Taxotere chemotherapy 75 mg per metered square started on July 13, 2019.  He is here for cycle 3 of therapy.    Androgen deprivation under [facility] care of [facility] and Dr. Diona Fanti.  Interim History: Mr. Blase presents today for a follow-up.  Since the last visit, he is reporting increased fatigue and overall generalized weakness and poor appetite.  He denies any nausea, vomiting or abdominal pain.  He denies any diarrhea.  He had a Port-A-Cath inserted without any complications at this time.   Medications: Reviewed today and updated. Current Outpatient Medications  Medication Sig Dispense Refill  . alendronate (FOSAMAX) 70 MG tablet Take 70 mg  by mouth once a week.     Marland Kitchen amLODipine (NORVASC) 10 MG tablet TAKE 1 TABLET (10 MG TOTAL) DAILY BY MOUTH. 90 tablet 3  . Hypromellose (ARTIFICIAL TEARS OP) Place 1 drop into the left eye daily.    . ILEVRO 0.3 % ophthalmic suspension Place 1 drop into the right eye daily.   2  . lidocaine (LIDODERM) 5 % Place 1 patch onto the skin daily. Remove & Discard patch within 12 hours or as directed by MD 30 patch 0  . potassium chloride (KLOR-CON) 10 MEQ tablet Take 1 tablet (10 mEq total) by mouth daily. 90 tablet 3  . pravastatin (PRAVACHOL) 20 MG tablet TAKE 1 TABLET BY MOUTH EVERY DAY IN THE EVENING (Patient taking differently: Take 20 mg by mouth daily. ) 90 tablet 3  . prochlorperazine (COMPAZINE) 10 MG tablet Take 1 tablet (10 mg total) by mouth every 6 (six) hours as needed for nausea or vomiting. 30 tablet 0   No current facility-administered medications for this visit.     Allergies:  Allergies  Allergen Reactions  . Aspirin Nausea Only    Patient stated,"I get nauseated with higher doses of Aspirin."      Physical Exam:     Blood pressure (!) 86/72, pulse 80, temperature 97.7 F (36.5 C), temperature source Temporal, resp. rate 19, height 5\' 10"  (1.778 m), weight 205 lb (93 kg), SpO2 100 %.       ECOG: 1    General appearance: Alert, awake without any distress. Head: Atraumatic  without abnormalities Oropharynx: Without any thrush or ulcers. Eyes: No scleral icterus. Lymph nodes: No lymphadenopathy noted in the cervical, supraclavicular, or axillary nodes Heart:regular rate and rhythm, without any murmurs or gallops.   Lung: Clear to auscultation without any rhonchi, wheezes or dullness to percussion. Abdomin: Soft, nontender without any shifting dullness or ascites. Musculoskeletal: No clubbing or cyanosis. Neurological: No motor or sensory deficits. Skin: No rashes or lesions. Psychiatric: Mood and affect appeared normal.           Lab Results: Lab  Results  Component Value Date   WBC 30.4 (H) 08/16/2019   HGB 11.2 (L) 08/16/2019   HCT 34.1 (L) 08/16/2019   MCV 91.4 08/16/2019   PLT 143 (L) 08/16/2019     Chemistry      Component Value Date/Time   NA 137 08/16/2019 1018   NA 141 08/12/2017 1258   K 3.2 (L) 08/16/2019 1018   K 3.4 (L) 08/12/2017 1258   CL 102 08/16/2019 1018   CO2 21 (L) 08/16/2019 1018   CO2 27 08/12/2017 1258   BUN 20 08/16/2019 1018   BUN 14.9 08/12/2017 1258   CREATININE 1.43 (H) 08/16/2019 1018   CREATININE 1.14 08/03/2019 1101   CREATININE 1.0 08/12/2017 1258      Component Value Date/Time   CALCIUM 9.1 08/16/2019 1018   CALCIUM 9.5 08/12/2017 1258   ALKPHOS 128 (H) 08/03/2019 1101   ALKPHOS 62 08/12/2017 1258   AST 24 08/03/2019 1101   AST 36 (H) 08/12/2017 1258   ALT 20 08/03/2019 1101   ALT 43 08/12/2017 1258   BILITOT 0.6 08/03/2019 1101   BILITOT 0.34 08/12/2017 1258         Results for ROEY, VIROLA I (MRN [id number]) as of 08/24/2019 08:44  Ref. Range 07/13/2019 09:36 08/03/2019 11:01  Prostate Specific Ag, Serum Latest Ref Range: 0.0 - 4.0 ng/mL 61.1 (H) 47.4 (H)        Impression and Plan:   84 year old man with:  1.  Advanced prostate cancer with pelvic adenopathy since 2018.  He has castration-resistant at this time.   He continues to tolerate Taxotere chemotherapy with very few complaints.  His PSA is showing declined at this time.  Risks and benefits of continuing this current therapy was reviewed today.  Potential complications include excessive fatigue, neuropathy and myelosuppression were reiterated.  After discussion today, we have elected to hold chemotherapy today given his overall generalized weakness and hypotension. will resume chemotherapy in 3 weeks at a reduced dose.  2. Androgen deprivation therapy: he is currently receiving it at [facility].  I recommended continuing this indefinitely.  3. Bone directed therapy: I recommended continuing calcium  and vitamin D supplements.  He deferred the option of Xgeva.  4.  Prognosis and goals of care: Therapy remains palliative at this time although aggressive measures are warranted given his reasonable performance status.  5.  Hypertension: He is on Norvasc which I asked him to discontinue for the time being.  We will give him some gentle hydration today  6.  IV access: Port-A-Cath inserted without any complications.  This will be used for subsequent chemotherapy.  7.  Anorexia weight loss: We have discussed strategies to boost his appetite including using nutritional supplements.  8. Follow-up: He will return in 3 weeks for evaluation prior to his next cycle of therapy.   30  minutes was spent on this encounter.  Time was dedicated to reviewing laboratory data, disease status update, treatment options  as well as addressing complications related to his current therapy.      Zola Button, MD 1/14/20219:03 AM

## 2019-08-25 LAB — PROSTATE-SPECIFIC AG, SERUM (LABCORP): Prostate Specific Ag, Serum: 57.7 ng/mL — ABNORMAL HIGH (ref 0.0–4.0)

## 2019-08-26 ENCOUNTER — Inpatient Hospital Stay: Payer: Medicare Other

## 2019-09-11 DIAGNOSIS — Z20822 Contact with and (suspected) exposure to covid-19: Secondary | ICD-10-CM | POA: Diagnosis not present

## 2019-09-11 DIAGNOSIS — R509 Fever, unspecified: Secondary | ICD-10-CM | POA: Diagnosis not present

## 2019-09-11 DIAGNOSIS — Z886 Allergy status to analgesic agent status: Secondary | ICD-10-CM | POA: Diagnosis not present

## 2019-09-11 DIAGNOSIS — R000 Tachycardia, unspecified: Secondary | ICD-10-CM | POA: Diagnosis not present

## 2019-09-11 DIAGNOSIS — Z79899 Other long term (current) drug therapy: Secondary | ICD-10-CM | POA: Diagnosis not present

## 2019-09-11 DIAGNOSIS — R197 Diarrhea, unspecified: Secondary | ICD-10-CM | POA: Diagnosis not present

## 2019-09-11 DIAGNOSIS — I10 Essential (primary) hypertension: Secondary | ICD-10-CM | POA: Diagnosis not present

## 2019-09-11 DIAGNOSIS — R5383 Other fatigue: Secondary | ICD-10-CM | POA: Diagnosis not present

## 2019-09-11 DIAGNOSIS — R918 Other nonspecific abnormal finding of lung field: Secondary | ICD-10-CM | POA: Diagnosis not present

## 2019-09-14 ENCOUNTER — Telehealth: Payer: Self-pay | Admitting: Oncology

## 2019-09-14 ENCOUNTER — Inpatient Hospital Stay: Payer: Medicare Other

## 2019-09-14 ENCOUNTER — Inpatient Hospital Stay: Payer: Medicare Other | Attending: Oncology | Admitting: Oncology

## 2019-09-14 ENCOUNTER — Other Ambulatory Visit: Payer: Self-pay

## 2019-09-14 VITALS — BP 143/72 | HR 79 | Temp 98.5°F | Resp 18 | Wt 212.0 lb

## 2019-09-14 DIAGNOSIS — C7951 Secondary malignant neoplasm of bone: Secondary | ICD-10-CM | POA: Insufficient documentation

## 2019-09-14 DIAGNOSIS — I10 Essential (primary) hypertension: Secondary | ICD-10-CM | POA: Diagnosis not present

## 2019-09-14 DIAGNOSIS — Z95828 Presence of other vascular implants and grafts: Secondary | ICD-10-CM | POA: Diagnosis not present

## 2019-09-14 DIAGNOSIS — Z5189 Encounter for other specified aftercare: Secondary | ICD-10-CM | POA: Diagnosis not present

## 2019-09-14 DIAGNOSIS — C61 Malignant neoplasm of prostate: Secondary | ICD-10-CM

## 2019-09-14 DIAGNOSIS — Z5111 Encounter for antineoplastic chemotherapy: Secondary | ICD-10-CM | POA: Insufficient documentation

## 2019-09-14 DIAGNOSIS — K5900 Constipation, unspecified: Secondary | ICD-10-CM | POA: Diagnosis not present

## 2019-09-14 LAB — CMP (CANCER CENTER ONLY)
ALT: 25 U/L (ref 0–44)
AST: 22 U/L (ref 15–41)
Albumin: 3 g/dL — ABNORMAL LOW (ref 3.5–5.0)
Alkaline Phosphatase: 117 U/L (ref 38–126)
Anion gap: 9 (ref 5–15)
BUN: 12 mg/dL (ref 8–23)
CO2: 22 mmol/L (ref 22–32)
Calcium: 8.8 mg/dL — ABNORMAL LOW (ref 8.9–10.3)
Chloride: 106 mmol/L (ref 98–111)
Creatinine: 0.77 mg/dL (ref 0.61–1.24)
GFR, Est AFR Am: >60 mL/min (ref >60)
GFR, Estimated: >60 mL/min (ref >60)
Glucose, Bld: 97 mg/dL (ref 70–99)
Potassium: 3.6 mmol/L (ref 3.5–5.1)
Sodium: 137 mmol/L (ref 135–145)
Total Bilirubin: 0.4 mg/dL (ref 0.3–1.2)
Total Protein: 7.6 g/dL (ref 6.5–8.1)

## 2019-09-14 LAB — CBC WITH DIFFERENTIAL (CANCER CENTER ONLY)
Abs Immature Granulocytes: 0.01 10*3/uL (ref 0.00–0.07)
Basophils Absolute: 0 10*3/uL (ref 0.0–0.1)
Basophils Relative: 0 %
Eosinophils Absolute: 0.1 10*3/uL (ref 0.0–0.5)
Eosinophils Relative: 1 %
HCT: 29 % — ABNORMAL LOW (ref 39.0–52.0)
Hemoglobin: 9.3 g/dL — ABNORMAL LOW (ref 13.0–17.0)
Immature Granulocytes: 0 %
Lymphocytes Relative: 35 %
Lymphs Abs: 2.3 10*3/uL (ref 0.7–4.0)
MCH: 29 pg (ref 26.0–34.0)
MCHC: 32.1 g/dL (ref 30.0–36.0)
MCV: 90.3 fL (ref 80.0–100.0)
Monocytes Absolute: 0.7 10*3/uL (ref 0.1–1.0)
Monocytes Relative: 10 %
Neutro Abs: 3.6 10*3/uL (ref 1.7–7.7)
Neutrophils Relative %: 54 %
Platelet Count: 313 10*3/uL (ref 150–400)
RBC: 3.21 MIL/uL — ABNORMAL LOW (ref 4.22–5.81)
RDW: 14.8 % (ref 11.5–15.5)
WBC Count: 6.7 10*3/uL (ref 4.0–10.5)
nRBC: 0 % (ref 0.0–0.2)

## 2019-09-14 MED ORDER — SODIUM CHLORIDE 0.9% FLUSH
10.0000 mL | Freq: Once | INTRAVENOUS | Status: DC
  Filled 2019-09-14: qty 10

## 2019-09-14 MED ORDER — HEPARIN SOD (PORK) LOCK FLUSH 100 UNIT/ML IV SOLN
500.0000 [IU] | Freq: Once | INTRAVENOUS | Status: DC
  Filled 2019-09-14: qty 5

## 2019-09-14 MED ORDER — ALTEPLASE 2 MG IJ SOLR
2.0000 mg | Freq: Once | INTRAMUSCULAR | Status: DC | PRN
  Filled 2019-09-14: qty 2

## 2019-09-14 MED ORDER — SENNOSIDES-DOCUSATE SODIUM 8.6-50 MG PO TABS: 1.0000 | ORAL_TABLET | Freq: Two times a day (BID) | ORAL | 3 refills | Status: AC

## 2019-09-14 MED ORDER — MAGNESIUM CITRATE PO SOLN: 1.0000 | Freq: Once | ORAL | 0 refills | Status: AC

## 2019-09-14 MED ORDER — HEPARIN SOD (PORK) LOCK FLUSH 100 UNIT/ML IV SOLN
500.0000 [IU] | Freq: Once | INTRAVENOUS | Status: AC
  Administered 2019-09-14: 10:00:00 500 [IU] via INTRAVENOUS
  Filled 2019-09-14: qty 5

## 2019-09-14 MED ORDER — SODIUM CHLORIDE 0.9% FLUSH
10.0000 mL | INTRAVENOUS | Status: DC | PRN
  Administered 2019-09-14: 10 mL via INTRAVENOUS
  Filled 2019-09-14: qty 10

## 2019-09-14 NOTE — Progress Notes (Signed)
Hematology and Oncology Follow Up Visit  [name] [id number] 10/24/1935 84 y.o. 09/14/2019 9:57 AM [name], MDVincent, Mallie [name],*   Principle Diagnosis: 84 year old man advanced prostate cancer with lymphadenopathy diagnosed in 2018.  He has castration-resistant after presenting with Gleason score 4+4 = 8 2008.  Prior Therapy: He was treated with androgen deprivation as well as definitive radiation therapy. His PSA nadir was 0.5 in 2009.   He developed advanced disease with PSA in February 2015 was up to 36.35 and a fluoride PET scan showed measurable disease in the second rib as well as the 10th thoracic vertebral body. He was started on androgen deprivation since that time. He had an excellent PSA response initially with a nadir down to 0.08 in April 2016.   His PSA was 0.8 in February 2017 and Casodex was added.   In April 2017 his PSA was 0.28 and started to rise again in July 2017. His PSA was 0.44 and subsequently was 1.09 in October 2017. On 10/09/2016 his PSA was 3.39. His testosterone level was 30.4. Casodex was discontinued in April 2018. His PSA was 7.7 in August 2018.  Xtandi 160 mg daily started on 04/08/2017.  Therapy discontinued in November 2020 for progression of disease.   Current therapy:  Taxotere chemotherapy 75 mg per metered square started on July 13, 2019.  He is status post 2 cycles of therapy.    Androgen deprivation under the care of [facility] and Dr. Diona Fanti.  Interim History: [name] returns today for a follow-up visit.  Since the last visit, he is improving overall from the last cycle of chemotherapy 6 weeks ago.  He is eating better and has not reported any nausea or vomiting.  He does report some more abdominal fullness and distention related to constipation.  His last bowel movement was 3 days ago.  He reports no back pain or shoulder pain.  He denies any bone pain or pathological fractures.   Medications: Reviewed  today and updated. Current Outpatient Medications  Medication Sig Dispense Refill  . alendronate (FOSAMAX) 70 MG tablet Take 70 mg by mouth once a week.     Marland Kitchen amLODipine (NORVASC) 10 MG tablet TAKE 1 TABLET (10 MG TOTAL) DAILY BY MOUTH. 90 tablet 3  . Hypromellose (ARTIFICIAL TEARS OP) Place 1 drop into the left eye daily.    . ILEVRO 0.3 % ophthalmic suspension Place 1 drop into the right eye daily.   2  . lidocaine (LIDODERM) 5 % Place 1 patch onto the skin daily. Remove & Discard patch within 12 hours or as directed by MD 30 patch 0  . potassium chloride (KLOR-CON) 10 MEQ tablet Take 1 tablet (10 mEq total) by mouth daily. 90 tablet 3  . pravastatin (PRAVACHOL) 20 MG tablet TAKE 1 TABLET BY MOUTH EVERY DAY IN THE EVENING (Patient taking differently: Take 20 mg by mouth daily. ) 90 tablet 3  . prochlorperazine (COMPAZINE) 10 MG tablet Take 1 tablet (10 mg total) by mouth every 6 (six) hours as needed for nausea or vomiting. 30 tablet 0   Current Facility-Administered Medications  Medication Dose Route Frequency Provider Last Rate Last Admin  . alteplase (CATHFLO ACTIVASE) injection 2 mg  2 mg Intracatheter Once PRN Wyatt Portela, MD      . heparin lock flush 100 unit/mL  500 Units Intravenous Once Torsten Weniger N, MD      . sodium chloride flush (NS) 0.9 % injection 10 mL  10  mL Intravenous PRN Wyatt Portela, MD   10 mL at 09/14/19 0947  . sodium chloride flush (NS) 0.9 % injection 10 mL  10 mL Intravenous Once Wyatt Portela, MD         Allergies:  Allergies  Allergen Reactions  . Aspirin Nausea Only    Patient stated,"I get nauseated with higher doses of Aspirin."      Physical Exam:     Blood pressure (!) 143/72, pulse 79, temperature 98.5 F (36.9 C), temperature source Temporal, resp. rate 18, weight 212 lb (96.2 kg), SpO2 100 %.       ECOG: 1    General appearance: Comfortable appearing without any discomfort Head: Normocephalic without any  trauma Oropharynx: Mucous membranes are moist and pink without any thrush or ulcers. Eyes: Pupils are equal and round reactive to light. Lymph nodes: No cervical, supraclavicular, inguinal or axillary lymphadenopathy.   Heart:regular rate and rhythm.  S1 and S2 without leg edema. Lung: Clear without any rhonchi or wheezes.  No dullness to percussion. Abdomin: Soft, nontender, nondistended with good bowel sounds.  No hepatosplenomegaly. Musculoskeletal: No joint deformity or effusion.  Full range of motion noted. Neurological: No deficits noted on motor, sensory and deep tendon reflex exam. Skin: No petechial rash or dryness.  Appeared moist.              Lab Results: Lab Results  Component Value Date   WBC 6.7 09/14/2019   HGB 9.3 (L) 09/14/2019   HCT 29.0 (L) 09/14/2019   MCV 90.3 09/14/2019   PLT 313 09/14/2019     Chemistry      Component Value Date/Time   NA 137 08/24/2019 0842   NA 141 08/12/2017 1258   K 3.2 (L) 08/24/2019 0842   K 3.4 (L) 08/12/2017 1258   CL 104 08/24/2019 0842   CO2 19 (L) 08/24/2019 0842   CO2 27 08/12/2017 1258   BUN 29 (H) 08/24/2019 0842   BUN 14.9 08/12/2017 1258   CREATININE 1.78 (H) 08/24/2019 0842   CREATININE 1.0 08/12/2017 1258      Component Value Date/Time   CALCIUM 8.4 (L) 08/24/2019 0842   CALCIUM 9.5 08/12/2017 1258   ALKPHOS 102 08/24/2019 0842   ALKPHOS 62 08/12/2017 1258   AST 31 08/24/2019 0842   AST 36 (H) 08/12/2017 1258   ALT 22 08/24/2019 0842   ALT 43 08/12/2017 1258   BILITOT 0.7 08/24/2019 0842   BILITOT 0.34 08/12/2017 1258          Results for LEVAN, SOOY I (MRN [id number]) as of 09/14/2019 09:59  Ref. Range 08/24/2019 08:42  Prostate Specific Ag, Serum Latest Ref Range: 0.0 - 4.0 ng/mL 57.7 (H)        Impression and Plan:   84 year old man with:  1.  Castration-resistant prostate cancer with pelvic adenopathy diagnosed in 2018.     He has received 2 cycles of chemotherapy and  tolerated poorly but is currently recovering reasonably well.  Risks and benefits of resuming chemotherapy at a lower dose was discussed.  He is agreeable to do that but would like to defer the restart of chemotherapy for 3 more weeks.  I am in favor of starting chemotherapy in the immediate future but he would like to wait what a bit longer given his recent issues dominantly constipation.  He understands the risk of cancer would likely progress but his disease remains under reasonable control and would like to defer.  Plan is to  tentatively resume chemotherapy in 3 weeks at a reduced dose.  2. Androgen deprivation therapy: I recommended continuing this indefinitely which she receives under the care of [facility].  3. Bone directed therapy: I recommended continuing calcium and vitamin D supplements after recurrent Xgeva.  4.  Prognosis and goals of care: Therapy remains palliative at this time with aggressive measures are warranted.  5.  Hypertension: Blood pressure is reasonably controlled at this time.  6.  IV access: Port-A-Cath remains in place without any issues.  This will continue to be in use for future chemotherapy cycles.  7.  Constipation: We have discussed strategies today to improve his constipation.  I will prescribe Senokot-S which she will take daily.  We also discussed the strategy to use magnesium citrate to have a immediate bowel movement and then using laxatives only as needed.  We also encouraged hydration and increase fiber in his diet.  8. Follow-up: In 3 weeks for the next evaluation and next cycle of therapy.   30  minutes was dedicated to this visit.  Time was spent on reviewing his disease status, reviewing laboratory data, treatment options and addressing complications of therapy.      Zola Button, MD 2/4/20219:57 AM

## 2019-09-14 NOTE — Patient Instructions (Signed)

## 2019-09-14 NOTE — Telephone Encounter (Signed)
Scheduled appt per 2/4 los

## 2019-09-15 LAB — PROSTATE-SPECIFIC AG, SERUM (LABCORP): Prostate Specific Ag, Serum: 67.4 ng/mL — ABNORMAL HIGH (ref 0.0–4.0)

## 2019-09-16 ENCOUNTER — Ambulatory Visit: Payer: Medicare Other

## 2019-10-03 ENCOUNTER — Other Ambulatory Visit: Payer: Self-pay | Admitting: Student in an Organized Health Care Education/Training Program

## 2019-10-03 DIAGNOSIS — I10 Essential (primary) hypertension: Secondary | ICD-10-CM

## 2019-10-05 ENCOUNTER — Other Ambulatory Visit: Payer: Self-pay

## 2019-10-05 ENCOUNTER — Inpatient Hospital Stay: Payer: Medicare Other

## 2019-10-05 ENCOUNTER — Inpatient Hospital Stay (HOSPITAL_BASED_OUTPATIENT_CLINIC_OR_DEPARTMENT_OTHER): Payer: Medicare Other | Admitting: Oncology

## 2019-10-05 VITALS — BP 97/67 | HR 94 | Temp 98.0°F | Resp 18 | Ht 70.0 in | Wt 201.7 lb

## 2019-10-05 DIAGNOSIS — C61 Malignant neoplasm of prostate: Secondary | ICD-10-CM

## 2019-10-05 DIAGNOSIS — I10 Essential (primary) hypertension: Secondary | ICD-10-CM | POA: Diagnosis not present

## 2019-10-05 DIAGNOSIS — C7951 Secondary malignant neoplasm of bone: Secondary | ICD-10-CM | POA: Diagnosis not present

## 2019-10-05 DIAGNOSIS — Z5189 Encounter for other specified aftercare: Secondary | ICD-10-CM | POA: Diagnosis not present

## 2019-10-05 DIAGNOSIS — Z5111 Encounter for antineoplastic chemotherapy: Secondary | ICD-10-CM | POA: Diagnosis not present

## 2019-10-05 DIAGNOSIS — K5900 Constipation, unspecified: Secondary | ICD-10-CM | POA: Diagnosis not present

## 2019-10-05 LAB — CBC WITH DIFFERENTIAL (CANCER CENTER ONLY)
Abs Immature Granulocytes: 0.01 10*3/uL (ref 0.00–0.07)
Basophils Absolute: 0 10*3/uL (ref 0.0–0.1)
Basophils Relative: 0 %
Eosinophils Absolute: 0 10*3/uL (ref 0.0–0.5)
Eosinophils Relative: 0 %
HCT: 33.7 % — ABNORMAL LOW (ref 39.0–52.0)
Hemoglobin: 10.3 g/dL — ABNORMAL LOW (ref 13.0–17.0)
Immature Granulocytes: 0 %
Lymphocytes Relative: 36 %
Lymphs Abs: 2.8 10*3/uL (ref 0.7–4.0)
MCH: 27.8 pg (ref 26.0–34.0)
MCHC: 30.6 g/dL (ref 30.0–36.0)
MCV: 90.8 fL (ref 80.0–100.0)
Monocytes Absolute: 0.9 10*3/uL (ref 0.1–1.0)
Monocytes Relative: 12 %
Neutro Abs: 4 10*3/uL (ref 1.7–7.7)
Neutrophils Relative %: 52 %
Platelet Count: 387 10*3/uL (ref 150–400)
RBC: 3.71 MIL/uL — ABNORMAL LOW (ref 4.22–5.81)
RDW: 15.1 % (ref 11.5–15.5)
WBC Count: 7.8 10*3/uL (ref 4.0–10.5)
nRBC: 0 % (ref 0.0–0.2)

## 2019-10-05 LAB — CMP (CANCER CENTER ONLY)
ALT: 15 U/L (ref 0–44)
AST: 16 U/L (ref 15–41)
Albumin: 3 g/dL — ABNORMAL LOW (ref 3.5–5.0)
Alkaline Phosphatase: 185 U/L — ABNORMAL HIGH (ref 38–126)
Anion gap: 10 (ref 5–15)
BUN: 14 mg/dL (ref 8–23)
CO2: 21 mmol/L — ABNORMAL LOW (ref 22–32)
Calcium: 8.6 mg/dL — ABNORMAL LOW (ref 8.9–10.3)
Chloride: 108 mmol/L (ref 98–111)
Creatinine: 0.92 mg/dL (ref 0.61–1.24)
GFR, Est AFR Am: >60 mL/min (ref >60)
GFR, Estimated: >60 mL/min (ref >60)
Glucose, Bld: 110 mg/dL — ABNORMAL HIGH (ref 70–99)
Potassium: 4.2 mmol/L (ref 3.5–5.1)
Sodium: 139 mmol/L (ref 135–145)
Total Bilirubin: 0.5 mg/dL (ref 0.3–1.2)
Total Protein: 8 g/dL (ref 6.5–8.1)

## 2019-10-05 MED ORDER — SODIUM CHLORIDE 0.9% FLUSH
10.0000 mL | INTRAVENOUS | Status: DC | PRN
  Administered 2019-10-05: 10 mL
  Filled 2019-10-05: qty 10

## 2019-10-05 MED ORDER — SODIUM CHLORIDE 0.9 % IV SOLN
Freq: Once | INTRAVENOUS | Status: AC
  Filled 2019-10-05: qty 250

## 2019-10-05 MED ORDER — SODIUM CHLORIDE 0.9 % IV SOLN
60.0000 mg/m2 | Freq: Once | INTRAVENOUS | Status: AC
  Administered 2019-10-05: 130 mg via INTRAVENOUS
  Filled 2019-10-05: qty 13

## 2019-10-05 MED ORDER — HEPARIN SOD (PORK) LOCK FLUSH 100 UNIT/ML IV SOLN
500.0000 [IU] | Freq: Once | INTRAVENOUS | Status: AC | PRN
  Administered 2019-10-05: 500 [IU]
  Filled 2019-10-05: qty 5

## 2019-10-05 MED ORDER — DEXAMETHASONE SODIUM PHOSPHATE 10 MG/ML IJ SOLN
INTRAMUSCULAR | Status: AC
  Filled 2019-10-05: qty 1

## 2019-10-05 MED ORDER — DEXAMETHASONE SODIUM PHOSPHATE 10 MG/ML IJ SOLN
10.0000 mg | Freq: Once | INTRAMUSCULAR | Status: AC
  Administered 2019-10-05: 10 mg via INTRAVENOUS

## 2019-10-05 MED ORDER — MEGESTROL ACETATE 400 MG/10ML PO SUSP: 400.0000 mg | Freq: Two times a day (BID) | ORAL | 0 refills | Status: DC

## 2019-10-05 NOTE — Progress Notes (Signed)
Hematology and Oncology Follow Up Visit  [name] [id number] 11/09/1935 84 y.o. 10/05/2019 11:46 AM [name], MDVincent, Mallie [name],*   Principle Diagnosis: 84 year old man with castration-resistant prostate cancer with lymphadenopathy diagnosed in 2018.  He was diagnosed in 2008 with localized disease and Gleason score 4+4 = 8.   Prior Therapy: He was treated with androgen deprivation as well as definitive radiation therapy. His PSA nadir was 0.5 in 2009.   He developed advanced disease with PSA in February 2015 was up to 36.35 and a fluoride PET scan showed measurable disease in the second rib as well as the 10th thoracic vertebral body. He was started on androgen deprivation since that time. He had an excellent PSA response initially with a nadir down to 0.08 in April 2016.   His PSA was 0.8 in February 2017 and Casodex was added.   In April 2017 his PSA was 0.28 and started to rise again in July 2017. His PSA was 0.44 and subsequently was 1.09 in October 2017. On 10/09/2016 his PSA was 3.39. His testosterone level was 30.4. Casodex was discontinued in April 2018. His PSA was 7.7 in August 2018.  Xtandi 160 mg daily started on 04/08/2017.  Therapy discontinued in November 2020 for progression of disease.   Current therapy:  Taxotere chemotherapy 75 mg per metered square started on July 13, 2019.   He received 2 cycles of therapy and has been on hold because of delayed complications.    [facility] with [facility].  Interim History: [name] is here for repeat evaluation.  Since the last visit, he reports improvement in his overall health after holding chemotherapy at that time.  He denies any nausea, vomiting or abdominal pain.  His constipation has resolved.  His appetite remains overall poor and of lost more weight.  He is ambulating with the help of a cane without any recent falls or syncope.  He does report dyspnea after extensive  mobility.   Medications: Without any changes on review. Current Outpatient Medications  Medication Sig Dispense Refill  . alendronate (FOSAMAX) 70 MG tablet Take 70 mg by mouth once a week.     Marland Kitchen amLODipine (NORVASC) 10 MG tablet TAKE 1 TABLET (10 MG TOTAL) DAILY BY MOUTH. 90 tablet 3  . Hypromellose (ARTIFICIAL TEARS OP) Place 1 drop into the left eye daily.    . ILEVRO 0.3 % ophthalmic suspension Place 1 drop into the right eye daily.   2  . lidocaine (LIDODERM) 5 % Place 1 patch onto the skin daily. Remove & Discard patch within 12 hours or as directed by MD 30 patch 0  . potassium chloride (KLOR-CON) 10 MEQ tablet Take 1 tablet (10 mEq total) by mouth daily. 90 tablet 3  . pravastatin (PRAVACHOL) 20 MG tablet TAKE 1 TABLET BY MOUTH EVERY DAY IN THE EVENING (Patient taking differently: Take 20 mg by mouth daily. ) 90 tablet 3  . prochlorperazine (COMPAZINE) 10 MG tablet Take 1 tablet (10 mg total) by mouth every 6 (six) hours as needed for nausea or vomiting. 30 tablet 0  . senna-docusate (SENNA S) 8.6-50 MG tablet Take 1 tablet by mouth 2 (two) times daily. 60 tablet 3   No current facility-administered medications for this visit.     Allergies:  Allergies  Allergen Reactions  . Aspirin Nausea Only    Patient stated,"[name] get nauseated with higher doses of Aspirin."      Physical Exam:  Blood pressure 97/67, pulse 94, temperature 98 F (36.7 C), temperature source Temporal, resp. rate 18, height 5\' 10"  (1.778 m), weight 201 lb 11.2 oz (91.5 kg), SpO2 100 %.        ECOG: 1     General appearance: Alert, awake without any distress. Head: Atraumatic without abnormalities Oropharynx: Without any thrush or ulcers. Eyes: No scleral icterus. Lymph nodes: No lymphadenopathy noted in the cervical, supraclavicular, or axillary nodes Heart:regular rate and rhythm, without any murmurs or gallops.   Lung: Clear to auscultation without any rhonchi, wheezes or dullness to  percussion. Abdomin: Soft, nontender without any shifting dullness or ascites. Musculoskeletal: No clubbing or cyanosis. Neurological: No motor or sensory deficits. Skin: No rashes or lesions.               Lab Results: Lab Results  Component Value Date   WBC 6.7 09/14/2019   HGB 9.3 (L) 09/14/2019   HCT 29.0 (L) 09/14/2019   MCV 90.3 09/14/2019   PLT 313 09/14/2019     Chemistry      Component Value Date/Time   NA 137 09/14/2019 0927   NA 141 08/12/2017 1258   K 3.6 09/14/2019 0927   K 3.4 (L) 08/12/2017 1258   CL 106 09/14/2019 0927   CO2 22 09/14/2019 0927   CO2 27 08/12/2017 1258   BUN 12 09/14/2019 0927   BUN 14.9 08/12/2017 1258   CREATININE 0.77 09/14/2019 0927   CREATININE 1.0 08/12/2017 1258      Component Value Date/Time   CALCIUM 8.8 (L) 09/14/2019 0927   CALCIUM 9.5 08/12/2017 1258   ALKPHOS 117 09/14/2019 0927   ALKPHOS 62 08/12/2017 1258   AST 22 09/14/2019 0927   AST 36 (H) 08/12/2017 1258   ALT 25 09/14/2019 0927   ALT 43 08/12/2017 1258   BILITOT 0.4 09/14/2019 0927   BILITOT 0.34 08/12/2017 1258          Results for [name] (MRN [id number]) as of 10/05/2019 11:31  Ref. Range 08/24/2019 08:42 09/14/2019 09:27  Prostate Specific Ag, Serum Latest Ref Range: 0.0 - 4.0 ng/mL 57.7 (H) 67.4 (H)        Impression and Plan:   84 year old man with:  1.  Advanced prostate cancer with pelvic adenopathy since 2018.  He has castration-resistant disease.   He is currently on Taxotere chemotherapy also therapy has been on hold based on his wishes.  Risks and benefits of proceeding with chemotherapy at a reduced dose of 60 mg per metered square was reviewed.  Potential complications were nausea, vomiting, myelosuppression in addition neutropenia and possible sepsis.  After discussion today he is agreeable to proceed given the rise in his PSA.  Further dose reduction may be needed if he has more complications.   2. Androgen  deprivation therapy: Currently received at the [facility] and [name] recommended continuing this indefinitely.  3. Bone directed therapy: Some calcium and vitamin D supplements for the time being.  Delton See has been deferred for the time being unless he develops advanced bone metastasis.  4.  Prognosis and goals of care: His disease remains incurable and aggressive measures are warranted given his reasonable performance status.  5.  Anorexia: We have discussed strategies to boost his appetite including prescription for Megace as well as using nutritional supplements and specific diets.  6.  IV access: Port-A-Cath currently in use without any issues.  7.  Constipation: Resolved at this time.  The bowel habits are more regular  currently using a lot of prunes as well as increase fiber in his diet.  8. Follow-up: He will return in 3 weeks for repeat evaluation next cycle of therapy.   30  minutes were spent on this encounter.  The time was dedicated to reviewing his disease status, treatment options and complications related to therapy.      Zola Button, MD 2/25/202111:46 AM

## 2019-10-05 NOTE — Patient Instructions (Signed)
[facility] Discharge Instructions for Patients Receiving Chemotherapy  Today you received the following chemotherapy agent: Taxotere.  To help prevent nausea and vomiting after your treatment, we encourage you to take your nausea medication as directed.   If you develop nausea and vomiting that is not controlled by your nausea medication, call the clinic.   BELOW ARE SYMPTOMS THAT SHOULD BE REPORTED IMMEDIATELY:  *FEVER GREATER THAN 100.5 F  *CHILLS WITH OR WITHOUT FEVER  NAUSEA AND VOMITING THAT IS NOT CONTROLLED WITH YOUR NAUSEA MEDICATION  *UNUSUAL SHORTNESS OF BREATH  *UNUSUAL BRUISING OR BLEEDING  TENDERNESS IN MOUTH AND THROAT WITH OR WITHOUT PRESENCE OF ULCERS  *URINARY PROBLEMS  *BOWEL PROBLEMS  UNUSUAL RASH Items with * indicate a potential emergency and should be followed up as soon as possible.  Feel free to call the clinic you have any questions or concerns. The clinic phone number is [phone].  Please show the [facility] at check-in to the Emergency Department and triage nurse.

## 2019-10-05 NOTE — Patient Instructions (Signed)

## 2019-10-06 LAB — PROSTATE-SPECIFIC AG, SERUM (LABCORP): Prostate Specific Ag, Serum: 127 ng/mL — ABNORMAL HIGH (ref 0.0–4.0)

## 2019-10-07 ENCOUNTER — Other Ambulatory Visit: Payer: Self-pay

## 2019-10-07 ENCOUNTER — Inpatient Hospital Stay: Payer: Medicare Other

## 2019-10-07 VITALS — BP 123/71 | HR 70 | Temp 98.2°F | Resp 20

## 2019-10-07 DIAGNOSIS — I10 Essential (primary) hypertension: Secondary | ICD-10-CM | POA: Diagnosis not present

## 2019-10-07 DIAGNOSIS — K5900 Constipation, unspecified: Secondary | ICD-10-CM | POA: Diagnosis not present

## 2019-10-07 DIAGNOSIS — C61 Malignant neoplasm of prostate: Secondary | ICD-10-CM

## 2019-10-07 DIAGNOSIS — C7951 Secondary malignant neoplasm of bone: Secondary | ICD-10-CM | POA: Diagnosis not present

## 2019-10-07 DIAGNOSIS — Z5111 Encounter for antineoplastic chemotherapy: Secondary | ICD-10-CM | POA: Diagnosis not present

## 2019-10-07 DIAGNOSIS — Z5189 Encounter for other specified aftercare: Secondary | ICD-10-CM | POA: Diagnosis not present

## 2019-10-07 MED ORDER — PEGFILGRASTIM 6 MG/0.6ML ~~LOC~~ PSKT
PREFILLED_SYRINGE | SUBCUTANEOUS | Status: AC
  Filled 2019-10-07: qty 0.6

## 2019-10-07 MED ORDER — PEGFILGRASTIM INJECTION 6 MG/0.6ML ~~LOC~~
6.0000 mg | PREFILLED_SYRINGE | Freq: Once | SUBCUTANEOUS | Status: AC
  Administered 2019-10-07: 13:00:00 6 mg via SUBCUTANEOUS

## 2019-10-12 ENCOUNTER — Telehealth: Payer: Self-pay

## 2019-10-12 NOTE — Telephone Encounter (Signed)
-----   Message from Wyatt Portela, MD sent at 10/12/2019 11:19 AM EST ----- Regarding: RE: Patient concern He needs to see a dentist and no other recommendation from my standpoint.  Thanks. ----- Message ----- From: Teodoro Spray, RN Sent: 10/12/2019  11:08 AM EST To: Wyatt Portela, MD Subject: Patient concern                                Mr. Bayse states he had a tooth break off a few weeks ago and now is experiencing pain and soreness in the jaw on the left side of his face.  Patient states his face is slightly swollen on the side where his tooth broke off.  Patient denies SOB or difficulty swallowing. Patient's wife is working on having patient seen by a dentist to be further evaluated. Patient wants to know if there is anything else they need to do.  Please advise.  Thank you.

## 2019-10-12 NOTE — Telephone Encounter (Signed)
Called and relayed message to patient below. Confirmed with patient that he needs to be seen by his dentist. Patient verbalized understanding.

## 2019-10-18 ENCOUNTER — Other Ambulatory Visit: Payer: Self-pay | Admitting: Student in an Organized Health Care Education/Training Program

## 2019-10-24 ENCOUNTER — Telehealth: Payer: Self-pay

## 2019-10-24 NOTE — Telephone Encounter (Signed)
-----   Message from Wyatt Portela, MD sent at 10/24/2019  3:58 PM EDT ----- Regarding: RE: Prescription Medical There is no objections from my standpoint to do what ever it is needed from a dental standpoint.  Thanks ----- Message ----- From: Teodoro Spray, RN Sent: 10/24/2019   3:50 PM EDT To: Wyatt Portela, MD Subject: RE: Prescription Medical                       Dr. Cindra Eves office wants to know if it is ok for them to extract the affected tooth.  Please advise.  ----- Message ----- From: Wyatt Portela, MD Sent: 10/24/2019   1:15 PM EDT To: Teodoro Spray, RN Subject: RE: Prescription Medical                       I have no objections for him taking antibiotics.  Thanks ----- Message ----- From: Teodoro Spray, RN Sent: 10/24/2019  12:51 PM EDT To: Wyatt Portela, MD Subject: Prescription Medical                           Received call from patient's dental office, [facility] requesting medical clearance for patient to receive antibiotic for a tooth abscess. Dr. Altamese Dunlap would like to prescribe amoxicillin 500mg  but wants to clear it by you first.    Please advise.

## 2019-10-24 NOTE — Telephone Encounter (Signed)
See conversation below. Fax a copy of conversation to Dr. Cindra Eves office for an ok to treat patient's tooth abscess. Confirmation of fax received.  Spoke to Tammy at Dr. Cindra Eves office and she stated the office received the fax.

## 2019-10-26 ENCOUNTER — Inpatient Hospital Stay: Payer: Medicare Other

## 2019-10-26 ENCOUNTER — Inpatient Hospital Stay (HOSPITAL_BASED_OUTPATIENT_CLINIC_OR_DEPARTMENT_OTHER): Payer: Medicare Other | Admitting: Oncology

## 2019-10-26 ENCOUNTER — Inpatient Hospital Stay: Payer: Medicare Other | Attending: Oncology

## 2019-10-26 ENCOUNTER — Other Ambulatory Visit: Payer: Self-pay

## 2019-10-26 ENCOUNTER — Telehealth: Payer: Self-pay | Admitting: Oncology

## 2019-10-26 VITALS — BP 139/80 | HR 78 | Temp 98.2°F | Resp 18 | Ht 70.0 in | Wt 214.9 lb

## 2019-10-26 DIAGNOSIS — C61 Malignant neoplasm of prostate: Secondary | ICD-10-CM | POA: Diagnosis present

## 2019-10-26 DIAGNOSIS — Z95828 Presence of other vascular implants and grafts: Secondary | ICD-10-CM

## 2019-10-26 DIAGNOSIS — Z5111 Encounter for antineoplastic chemotherapy: Secondary | ICD-10-CM | POA: Insufficient documentation

## 2019-10-26 DIAGNOSIS — C7951 Secondary malignant neoplasm of bone: Secondary | ICD-10-CM | POA: Insufficient documentation

## 2019-10-26 DIAGNOSIS — Z5189 Encounter for other specified aftercare: Secondary | ICD-10-CM | POA: Diagnosis not present

## 2019-10-26 LAB — CBC WITH DIFFERENTIAL (CANCER CENTER ONLY)
Abs Immature Granulocytes: 0.14 10*3/uL — ABNORMAL HIGH (ref 0.00–0.07)
Basophils Absolute: 0 10*3/uL (ref 0.0–0.1)
Basophils Relative: 1 %
Eosinophils Absolute: 0 10*3/uL (ref 0.0–0.5)
Eosinophils Relative: 0 %
HCT: 31.6 % — ABNORMAL LOW (ref 39.0–52.0)
Hemoglobin: 9.7 g/dL — ABNORMAL LOW (ref 13.0–17.0)
Immature Granulocytes: 2 %
Lymphocytes Relative: 30 %
Lymphs Abs: 2.1 10*3/uL (ref 0.7–4.0)
MCH: 28 pg (ref 26.0–34.0)
MCHC: 30.7 g/dL (ref 30.0–36.0)
MCV: 91.1 fL (ref 80.0–100.0)
Monocytes Absolute: 0.9 10*3/uL (ref 0.1–1.0)
Monocytes Relative: 13 %
Neutro Abs: 3.8 10*3/uL (ref 1.7–7.7)
Neutrophils Relative %: 54 %
Platelet Count: 217 10*3/uL (ref 150–400)
RBC: 3.47 MIL/uL — ABNORMAL LOW (ref 4.22–5.81)
RDW: 19 % — ABNORMAL HIGH (ref 11.5–15.5)
WBC Count: 6.9 10*3/uL (ref 4.0–10.5)
nRBC: 0.3 % — ABNORMAL HIGH (ref 0.0–0.2)

## 2019-10-26 LAB — CMP (CANCER CENTER ONLY)
ALT: 15 U/L (ref 0–44)
AST: 19 U/L (ref 15–41)
Albumin: 3.4 g/dL — ABNORMAL LOW (ref 3.5–5.0)
Alkaline Phosphatase: 175 U/L — ABNORMAL HIGH (ref 38–126)
Anion gap: 9 (ref 5–15)
BUN: 7 mg/dL — ABNORMAL LOW (ref 8–23)
CO2: 22 mmol/L (ref 22–32)
Calcium: 8.5 mg/dL — ABNORMAL LOW (ref 8.9–10.3)
Chloride: 110 mmol/L (ref 98–111)
Creatinine: 0.73 mg/dL (ref 0.61–1.24)
GFR, Est AFR Am: >60 mL/min (ref >60)
GFR, Estimated: >60 mL/min (ref >60)
Glucose, Bld: 100 mg/dL — ABNORMAL HIGH (ref 70–99)
Potassium: 3.9 mmol/L (ref 3.5–5.1)
Sodium: 141 mmol/L (ref 135–145)
Total Bilirubin: 0.3 mg/dL (ref 0.3–1.2)
Total Protein: 7 g/dL (ref 6.5–8.1)

## 2019-10-26 MED ORDER — SODIUM CHLORIDE 0.9% FLUSH
10.0000 mL | INTRAVENOUS | Status: DC | PRN
  Administered 2019-10-26: 10 mL via INTRAVENOUS
  Filled 2019-10-26: qty 10

## 2019-10-26 MED ORDER — SODIUM CHLORIDE 0.9 % IV SOLN
Freq: Once | INTRAVENOUS | Status: AC
  Filled 2019-10-26: qty 250

## 2019-10-26 MED ORDER — SODIUM CHLORIDE 0.9% FLUSH
10.0000 mL | INTRAVENOUS | Status: DC | PRN
  Administered 2019-10-26: 10 mL
  Filled 2019-10-26: qty 10

## 2019-10-26 MED ORDER — DEXAMETHASONE SODIUM PHOSPHATE 10 MG/ML IJ SOLN
INTRAMUSCULAR | Status: AC
  Filled 2019-10-26: qty 1

## 2019-10-26 MED ORDER — HEPARIN SOD (PORK) LOCK FLUSH 100 UNIT/ML IV SOLN
500.0000 [IU] | Freq: Once | INTRAVENOUS | Status: AC | PRN
  Administered 2019-10-26: 500 [IU]
  Filled 2019-10-26: qty 5

## 2019-10-26 MED ORDER — SODIUM CHLORIDE 0.9 % IV SOLN
60.0000 mg/m2 | Freq: Once | INTRAVENOUS | Status: AC
  Administered 2019-10-26: 130 mg via INTRAVENOUS
  Filled 2019-10-26: qty 13

## 2019-10-26 MED ORDER — DEXAMETHASONE SODIUM PHOSPHATE 10 MG/ML IJ SOLN
10.0000 mg | Freq: Once | INTRAMUSCULAR | Status: AC
  Administered 2019-10-26: 10 mg via INTRAVENOUS

## 2019-10-26 MED ORDER — HEPARIN SOD (PORK) LOCK FLUSH 100 UNIT/ML IV SOLN
500.0000 [IU] | Freq: Once | INTRAVENOUS | Status: DC
  Filled 2019-10-26: qty 5

## 2019-10-26 NOTE — Progress Notes (Signed)
Hematology and Oncology Follow Up Visit  [name] [id number] Apr 15, 1936 84 y.o. 10/26/2019 8:24 AM [name], MDVincent, [name],*   Principle Diagnosis: 84 year old man with advanced prostate cancer and lymphadenopathy noted in 2018.  He has castration-resistant after presenting with localized disease and Gleason score 4+4 = 8 2008.  Prior Therapy: He was treated with androgen deprivation as well as definitive radiation therapy. His PSA nadir was 0.5 in 2009.   He developed advanced disease with PSA in February 2015 was up to 36.35 and a fluoride PET scan showed measurable disease in the second rib as well as the 10th thoracic vertebral body. He was started on androgen deprivation since that time. He had an excellent PSA response initially with a nadir down to 0.08 in April 2016.   His PSA was 0.8 in February 2017 and Casodex was added.   In April 2017 his PSA was 0.28 and started to rise again in July 2017. His PSA was 0.44 and subsequently was 1.09 in October 2017. On 10/09/2016 his PSA was 3.39. His testosterone level was 30.4. Casodex was discontinued in April 2018. His PSA was 7.7 in August 2018.  Xtandi 160 mg daily started on 04/08/2017.  Therapy discontinued in November 2020 for progression of disease.   Current therapy:  Taxotere chemotherapy 75 mg per metered square started  on July 13, 2019.   He is status post 3 cycles of therapy and currently at a reduced dose of 60 mg per metered square for better tolerance.    [facility] with [facility].  Interim History: [name] is here for a follow-up visit.  Since the last visit, he received the last cycle of chemotherapy without any major complications.  He denies any nausea, vomiting or abdominal pain.  He is eating better and has gained weight.  He is scheduled to have dental work on Monday because of a broken tooth.  He has denied any fevers or chills or night sweats.  He denies  any hospitalization or illnesses.   Medications: Reviewed without any changes. Current Outpatient Medications  Medication Sig Dispense Refill  . alendronate (FOSAMAX) 70 MG tablet Take 70 mg by mouth once a week.     Marland Kitchen amLODipine (NORVASC) 10 MG tablet TAKE 1 TABLET (10 MG TOTAL) DAILY BY MOUTH. 90 tablet 3  . Hypromellose (ARTIFICIAL TEARS OP) Place 1 drop into the left eye daily.    . ILEVRO 0.3 % ophthalmic suspension Place 1 drop into the right eye daily.   2  . lidocaine (LIDODERM) 5 % Place 1 patch onto the skin daily. Remove & Discard patch within 12 hours or as directed by MD 30 patch 0  . megestrol (MEGACE) 400 MG/10ML suspension Take 10 mLs (400 mg total) by mouth 2 (two) times daily. 240 mL 0  . potassium chloride (KLOR-CON) 10 MEQ tablet Take 1 tablet (10 mEq total) by mouth daily. 90 tablet 3  . pravastatin (PRAVACHOL) 20 MG tablet TAKE 1 TABLET BY MOUTH EVERY DAY IN THE EVENING (Patient taking differently: Take 20 mg by mouth daily. ) 90 tablet 3  . prochlorperazine (COMPAZINE) 10 MG tablet Take 1 tablet (10 mg total) by mouth every 6 (six) hours as needed for nausea or vomiting. 30 tablet 0  . senna-docusate (SENNA S) 8.6-50 MG tablet Take 1 tablet by mouth 2 (two) times daily. 60 tablet 3   No current facility-administered medications for this visit.   Facility-Administered Medications Ordered in Other  Visits  Medication Dose Route Frequency Provider Last Rate Last Admin  . heparin lock flush 100 unit/mL  500 Units Intravenous Once Wyatt Portela, MD      . sodium chloride flush (NS) 0.9 % injection 10 mL  10 mL Intravenous PRN Wyatt Portela, MD   10 mL at 10/26/19 0815     Allergies:  Allergies  Allergen Reactions  . Aspirin Nausea Only    Patient stated,"[name] get nauseated with higher doses of Aspirin."      Physical Exam:   Blood pressure 139/80, pulse 78, temperature 98.2 F (36.8 C), temperature source Temporal, resp. rate 18, height 5\' 10"  (1.778 m),  weight 214 lb 14.4 oz (97.5 kg), SpO2 100 %.       ECOG: 1   General appearance: Comfortable appearing without any discomfort Head: Normocephalic without any trauma Oropharynx: Mucous membranes are moist and pink without any thrush or ulcers. Eyes: Pupils are equal and round reactive to light. Lymph nodes: No cervical, supraclavicular, inguinal or axillary lymphadenopathy.   Heart:regular rate and rhythm.  S1 and S2 without leg edema. Lung: Clear without any rhonchi or wheezes.  No dullness to percussion. Abdomin: Soft, nontender, nondistended with good bowel sounds.  No hepatosplenomegaly. Musculoskeletal: No joint deformity or effusion.  Full range of motion noted. Neurological: No deficits noted on motor, sensory and deep tendon reflex exam. Skin: No petechial rash or dryness.  Appeared moist.                 Lab Results: Lab Results  Component Value Date   WBC 7.8 10/05/2019   HGB 10.3 (L) 10/05/2019   HCT 33.7 (L) 10/05/2019   MCV 90.8 10/05/2019   PLT 387 10/05/2019     Chemistry      Component Value Date/Time   NA 139 10/05/2019 1142   NA 141 08/12/2017 1258   K 4.2 10/05/2019 1142   K 3.4 (L) 08/12/2017 1258   CL 108 10/05/2019 1142   CO2 21 (L) 10/05/2019 1142   CO2 27 08/12/2017 1258   BUN 14 10/05/2019 1142   BUN 14.9 08/12/2017 1258   CREATININE 0.92 10/05/2019 1142   CREATININE 1.0 08/12/2017 1258      Component Value Date/Time   CALCIUM 8.6 (L) 10/05/2019 1142   CALCIUM 9.5 08/12/2017 1258   ALKPHOS 185 (H) 10/05/2019 1142   ALKPHOS 62 08/12/2017 1258   AST 16 10/05/2019 1142   AST 36 (H) 08/12/2017 1258   ALT 15 10/05/2019 1142   ALT 43 08/12/2017 1258   BILITOT 0.5 10/05/2019 1142   BILITOT 0.34 08/12/2017 1258        Results for Ronnie, DICAPUA [name] (MRN [id number]) as of 10/26/2019 08:13  Ref. Range 08/24/2019 08:42 09/14/2019 09:27 10/05/2019 11:42  Prostate Specific Ag, Serum Latest Ref Range: 0.0 - 4.0 ng/mL 57.7 (H) 67.4 (H)  127.0 (H)         Impression and Plan:   84 year old man with:  1.  Castration-resistant prostate cancer with disease to the pelvic lymph nodes diagnosed in 2018.     He has been receiving Taxotere chemotherapy with a dose reduction for better tolerance.  Risks and benefits of continuing this therapy long-term were reviewed.  His PSA did show an increase prior to the last cycle of therapy because of withheld treatment.  Complication associated with this treatment including nausea, vomiting, myelosuppression, neutropenia and early sepsis.   After discussion today he is agreeable to proceed given his  reasonable tolerance to the last cycle of chemotherapy.   2. Androgen deprivation therapy: [name] recommended continuing this indefinitely which she has been receiving under the care of [facility].  3. Bone directed therapy: [name] recommended continued calcium and vitamin D supplements for osteoporosis prevention.  Delton See has been deferred at this time unless he develops worsening metastatic disease in the bone.  4.  Prognosis and goals of care: Therapy remains palliative at this time with his disease being incurable.  Aggressive measures are warranted given his reasonable performance status.  5.  Anorexia: Improved and continues to gain weight.  6.  IV access: No issues reported with infusion via Port-A-Cath.  Remains accessed without problems.  7.  Dental work anticipated: No objection at this time given his adequate white cell count [name] will receive growth factor support after each cycle of therapy.  8. Follow-up: In 3 weeks for repeat evaluation.   30  minutes were dedicated to this visit.  The time was spent on reviewing his laboratory data, disease status update, treatment options and addressing complications noted to therapy.      Zola Button, MD 3/18/20218:24 AM

## 2019-10-26 NOTE — Telephone Encounter (Signed)
Scheduled appt per 3/18 los.  

## 2019-10-26 NOTE — Patient Instructions (Signed)
[facility] Discharge Instructions for Patients Receiving Chemotherapy  Today you received the following chemotherapy agents: Taxotere  To help prevent nausea and vomiting after your treatment, we encourage you to take your nausea medication as directed.    If you develop nausea and vomiting that is not controlled by your nausea medication, call the clinic.   BELOW ARE SYMPTOMS THAT SHOULD BE REPORTED IMMEDIATELY:  *FEVER GREATER THAN 100.5 F  *CHILLS WITH OR WITHOUT FEVER  NAUSEA AND VOMITING THAT IS NOT CONTROLLED WITH YOUR NAUSEA MEDICATION  *UNUSUAL SHORTNESS OF BREATH  *UNUSUAL BRUISING OR BLEEDING  TENDERNESS IN MOUTH AND THROAT WITH OR WITHOUT PRESENCE OF ULCERS  *URINARY PROBLEMS  *BOWEL PROBLEMS  UNUSUAL RASH Items with * indicate a potential emergency and should be followed up as soon as possible.  Feel free to call the clinic should you have any questions or concerns. The clinic phone number is [phone].  Please show the CHEMO ALERT CARD at check-in to the Emergency Department and triage nurse.   

## 2019-10-27 ENCOUNTER — Telehealth: Payer: Self-pay

## 2019-10-27 LAB — PROSTATE-SPECIFIC AG, SERUM (LABCORP): Prostate Specific Ag, Serum: 67.4 ng/mL — ABNORMAL HIGH (ref 0.0–4.0)

## 2019-10-27 NOTE — Telephone Encounter (Signed)
-----   Message from Wyatt Portela, MD sent at 10/27/2019  8:31 AM EDT ----- Please let him know his PSA is down

## 2019-10-27 NOTE — Telephone Encounter (Signed)
Called and made patient aware of PSA result. Patient and wife verbalized understanding.

## 2019-10-28 ENCOUNTER — Inpatient Hospital Stay: Payer: Medicare Other

## 2019-10-28 VITALS — BP 124/75 | HR 94 | Temp 99.1°F | Resp 18

## 2019-10-28 DIAGNOSIS — Z5189 Encounter for other specified aftercare: Secondary | ICD-10-CM | POA: Diagnosis not present

## 2019-10-28 DIAGNOSIS — Z5111 Encounter for antineoplastic chemotherapy: Secondary | ICD-10-CM | POA: Diagnosis not present

## 2019-10-28 DIAGNOSIS — C7951 Secondary malignant neoplasm of bone: Secondary | ICD-10-CM | POA: Diagnosis not present

## 2019-10-28 DIAGNOSIS — C61 Malignant neoplasm of prostate: Secondary | ICD-10-CM

## 2019-10-28 MED ORDER — PEGFILGRASTIM INJECTION 6 MG/0.6ML ~~LOC~~
6.0000 mg | PREFILLED_SYRINGE | Freq: Once | SUBCUTANEOUS | Status: AC
  Administered 2019-10-28: 6 mg via SUBCUTANEOUS

## 2019-10-28 NOTE — Patient Instructions (Signed)

## 2019-10-30 ENCOUNTER — Ambulatory Visit: Payer: Medicare Other | Attending: Internal Medicine

## 2019-10-30 DIAGNOSIS — Z23 Encounter for immunization: Secondary | ICD-10-CM

## 2019-10-30 NOTE — Progress Notes (Signed)
   Covid-19 Vaccination Clinic  Name:  [name]    MRN: [id number] DOB: 1935/09/28  10/30/2019  [name] was observed post Covid-19 immunization for 30 minutes based on pre-vaccination screening without incident. He was provided with Vaccine Information Sheet and instruction to access the V-Safe system.   [name] was instructed to call 911 with any severe reactions post vaccine: Marland Kitchen Difficulty breathing  . Swelling of face and throat  . A fast heartbeat  . A bad rash all over body  . Dizziness and weakness   Immunizations Administered    Name Date Dose VIS Date Route   Pfizer COVID-19 Vaccine 10/30/2019  9:22 AM 0.3 mL 07/21/2019 Intramuscular   Manufacturer: [place]   Lot: [id number]   [place]: [id number]

## 2019-11-01 DIAGNOSIS — C7951 Secondary malignant neoplasm of bone: Secondary | ICD-10-CM | POA: Diagnosis not present

## 2019-11-01 DIAGNOSIS — N35012 Post-traumatic membranous urethral stricture: Secondary | ICD-10-CM | POA: Diagnosis not present

## 2019-11-08 ENCOUNTER — Encounter: Payer: Self-pay | Admitting: Oncology

## 2019-11-08 IMAGING — CT CT ABDOMEN AND PELVIS WITH CONTRAST
2 of 5 series · 16 of 46 positions shown, 18 images · IV contrast (omnipaque)
Comparison: Multiple exams, including 01/20/2018

CLINICAL DATA: Prostate cancer restaging, prior androgen
deprivation therapy as well as radiation therapy.

EXAM:
CT ABDOMEN AND PELVIS WITH CONTRAST
TECHNIQUE: Multidetector CT imaging of the abdomen and pelvis was performed
using the standard protocol following bolus administration of
intravenous contrast.
CONTRAST:  100mL OMNIPAQUE IOHEXOL 300 MG/ML  SOLN

[Series 2: axial st · axial · 0.98mm/px · z∈[-70,+316]mm · 13 of 91 slices shown, 15 images]
[im 7/91  soft-tissue]
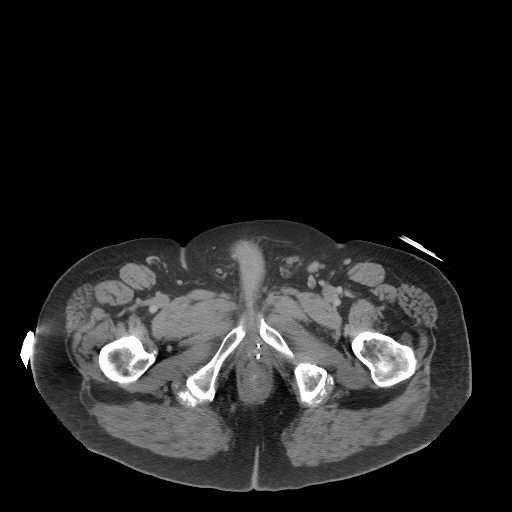
[im 7/91  bone]
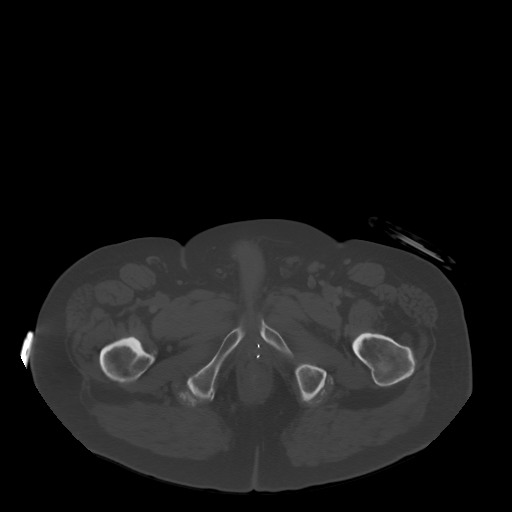
[im 13/91  soft-tissue]
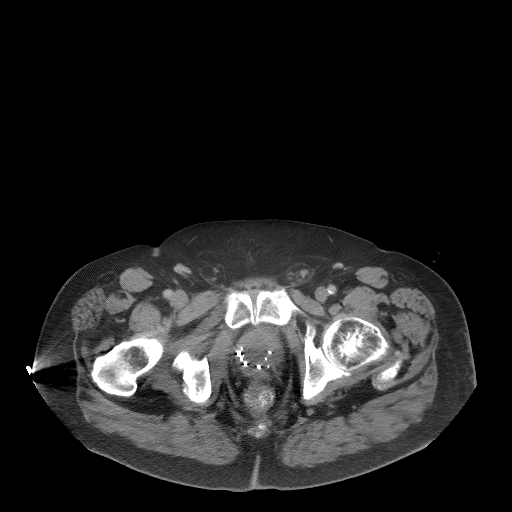
[im 20/91  soft-tissue]
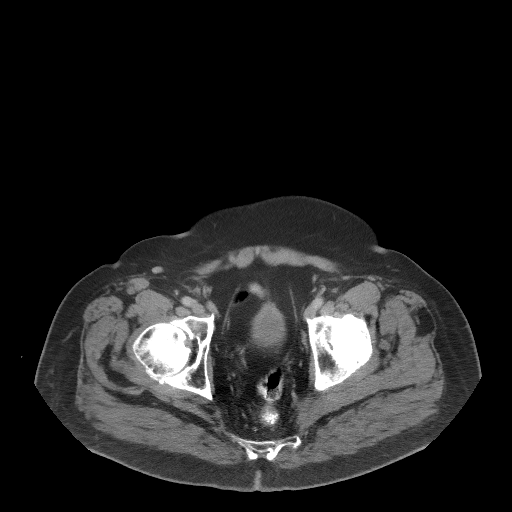
[im 26/91  soft-tissue]
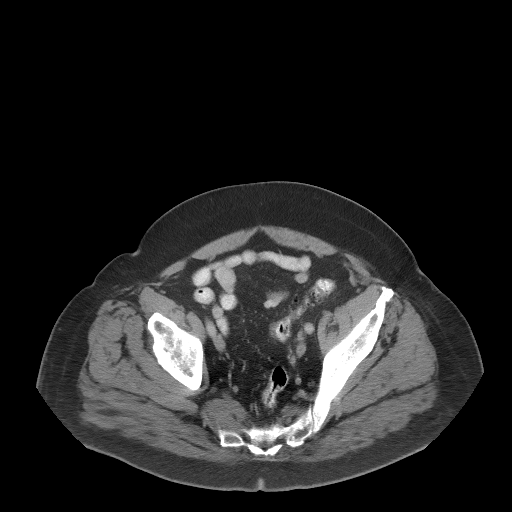
[im 33/91  soft-tissue]
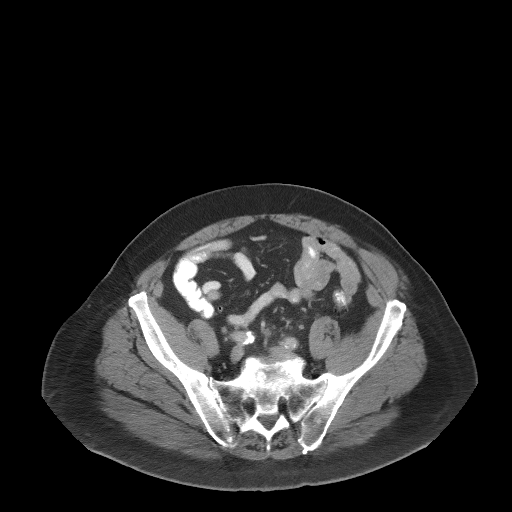
[im 39/91  soft-tissue]
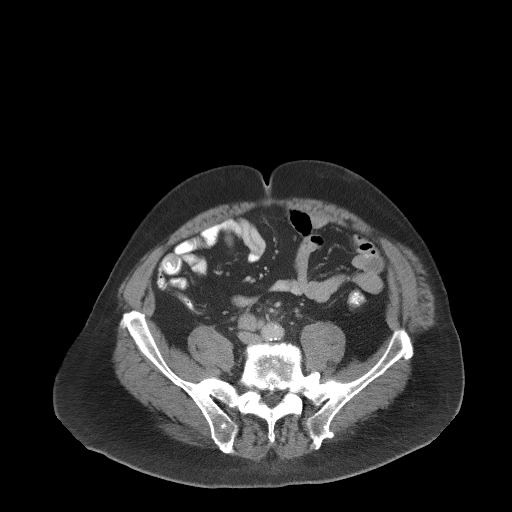
[im 46/91  soft-tissue]
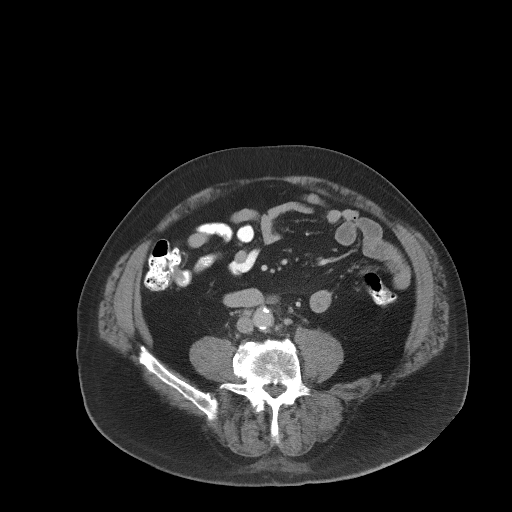
[im 52/91  soft-tissue]
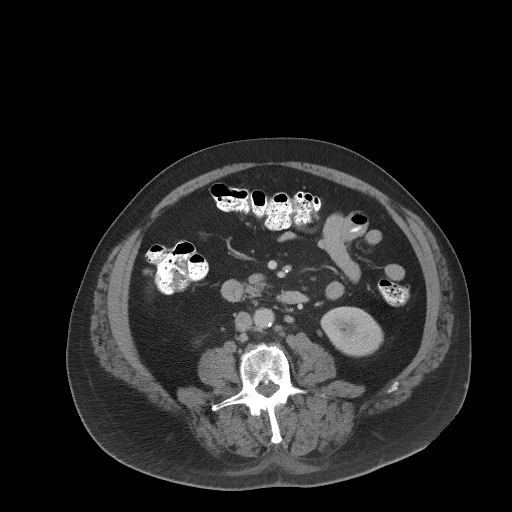
[im 58/91  soft-tissue]
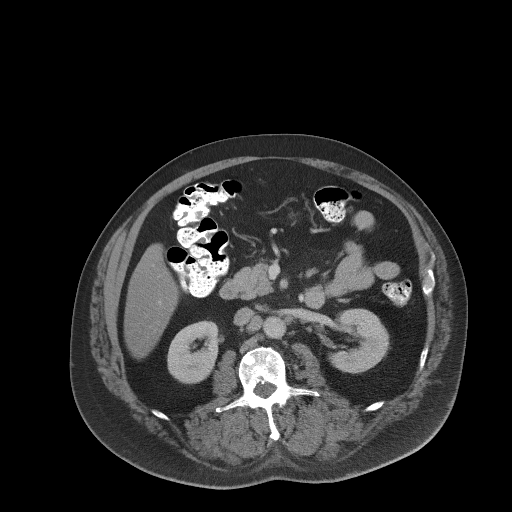
[im 58/91  bone]
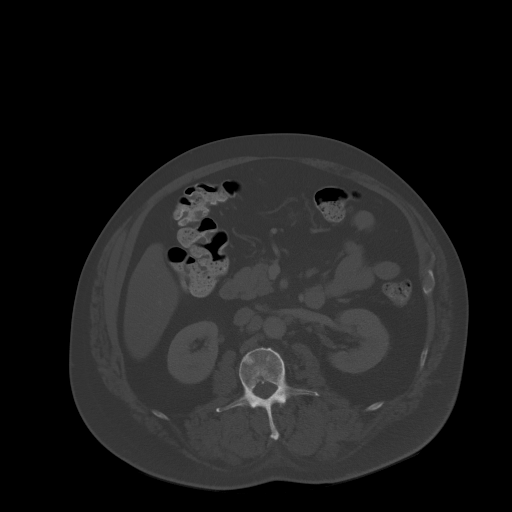
[im 65/91  soft-tissue]
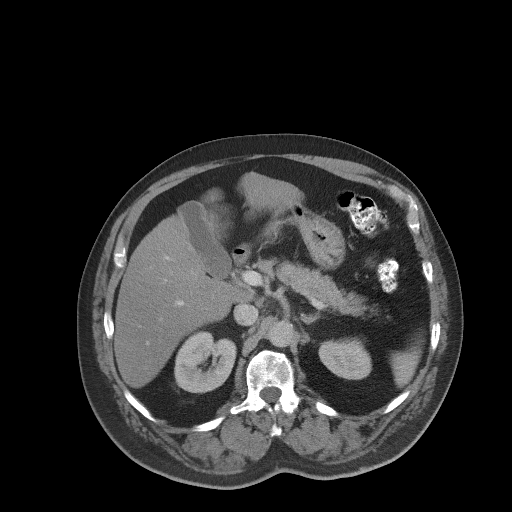
[im 71/91  soft-tissue]
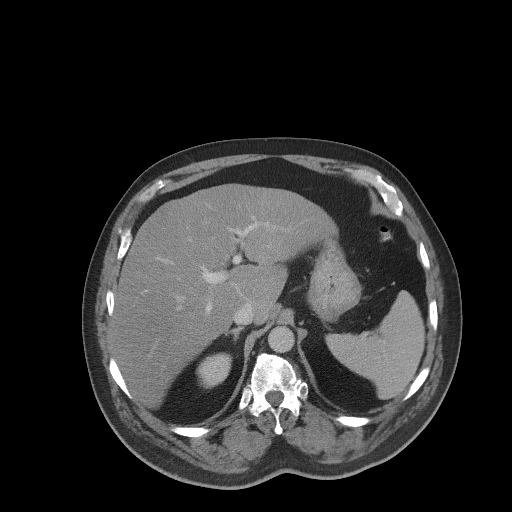
[im 78/91  soft-tissue]
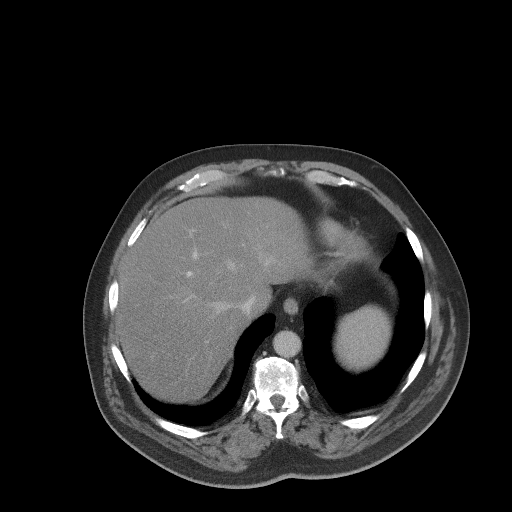
[im 84/91  soft-tissue]
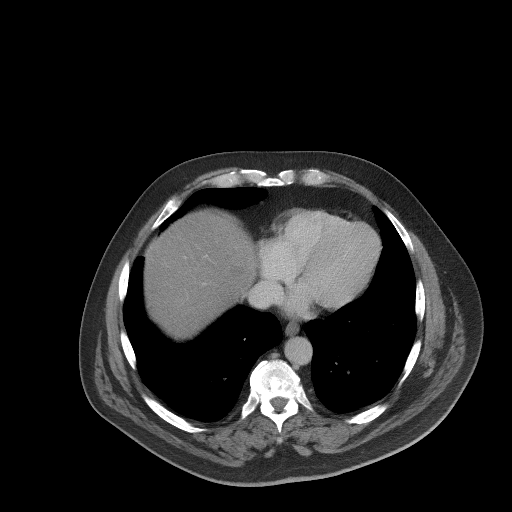

[Series 4: coronal st · coronal · 0.86mm/px · 3 of 106 slices shown]
[im 36/106  soft-tissue]
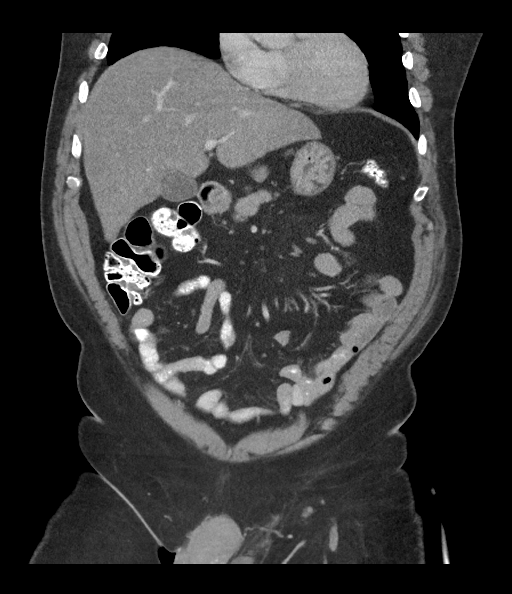
[im 47/106  soft-tissue]
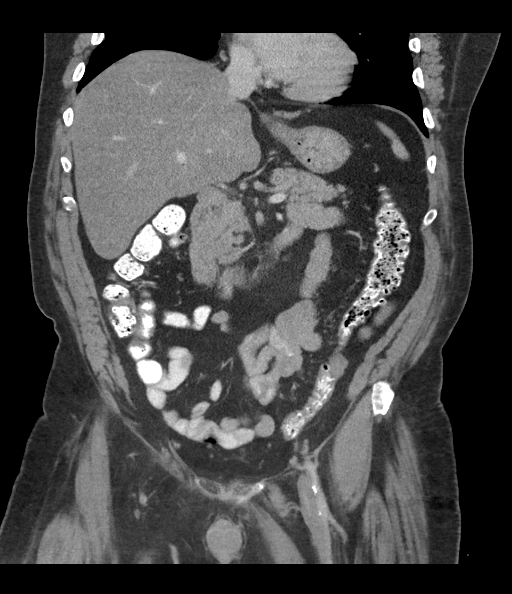
[im 59/106  soft-tissue]
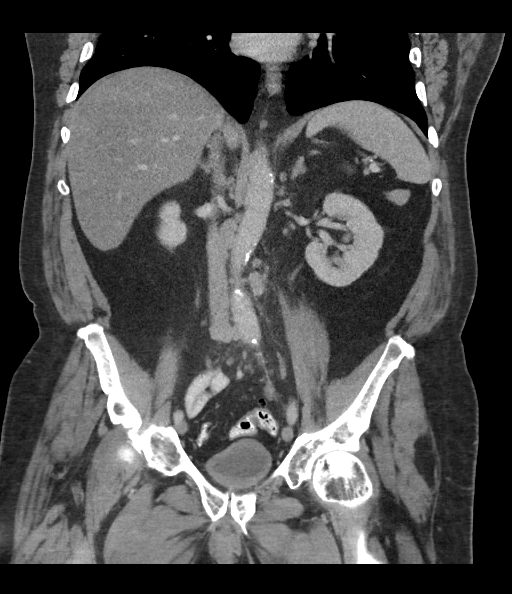

[16 of 46 positions shown; findings below may reference images not displayed]

FINDINGS: Lower chest: Gynecomastia. Faint calcification in the right coronary
artery.

Hepatobiliary: Diffuse hepatic steatosis. Slightly lobular contour
of the liver, morphology suspicious for early cirrhosis. Faint
dependent density in the gallbladder could be from sludge or
gallstones. No biliary dilatation.

Pancreas: Unremarkable

Spleen: Unremarkable

Adrenals/Urinary Tract: Adrenal glands normal. No significant renal
abnormality. No urinary tract calculi.

Stomach/Bowel: Sigmoid colon diverticulosis.

Vascular/Lymphatic: Aortoiliac atherosclerotic vascular disease.
Worsening retroperitoneal adenopathy noted. Just above the left
renal vein a left periaortic node measures 1.8 cm in short axis on
image [DATE], formerly 0.9 cm. An aortocaval node measures 1.4 cm in
short axis on image 36/2, formerly 1.0 cm. A lower aortocaval node
measures 2.1 cm in short axis on image 52/2, formerly 1.4 cm. Small
bilateral pelvic lymph nodes are not overtly pathologically
enlarged, and similar to prior.

Reproductive: Brachytherapy seeds are present throughout the
relatively small sized prostate gland.

Other: No supplemental non-categorized findings.

Musculoskeletal: Scattered mostly small sclerotic lesions are
present in the ribs, lumbar spine, bony pelvis. The for the most
part these are very similar to the prior exam. An index lesion in
the right side of the L2 vertebral body on image 51/5 measures
cm craniocaudad, formerly 1.0 cm.
IMPRESSION: 1. Worsening periaortic and retroperitoneal adenopathy.
2. Stable scattered tiny sclerotic lesions scattered throughout the
skeleton compatible with prior osseous metastatic lesions. For the
most part these appear relatively stable.
3. Other imaging findings of potential clinical significance:
Gynecomastia. Diffuse hepatic steatosis with suspicion for early
cirrhosis. Sludge or gallstones in the gallbladder. Sigmoid colon
diverticulosis. Aortic Atherosclerosis ([id number]). Brachytherapy
seeds in the prostate gland.

## 2019-11-08 IMAGING — NM NUCLEAR MEDICINE WHOLE BODY BONE SCINTIGRAPHY
2 series · 2 of 2 positions shown · non-contrast
Comparison: Bone scan 01/20/2018, CT abdomen pelvis 12/15/2018

CLINICAL DATA: Prostate cancer. Elevated PSA. Castrate resistant
prostate carcinoma.

EXAM:
NUCLEAR MEDICINE WHOLE BODY BONE SCAN
TECHNIQUE: Whole body anterior and posterior images were obtained approximately
3 hours after intravenous injection of radiopharmaceutical.
RADIOPHARMACEUTICALS:  20.9 mCi Bechnetium-WWm MDP IV

[Series 1: wbr_bone_40 whole body · 2.66mm/px · 1 of 1 slices shown (1 of 2)]
[im 1/1]
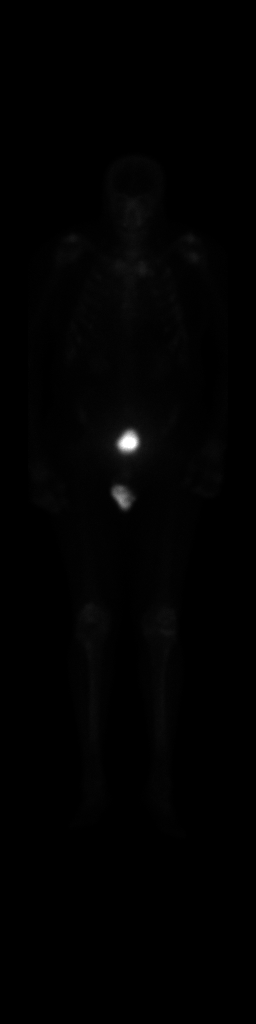

[Series 1: wbr_bone_40 whole body · 2.66mm/px · 1 of 1 slices shown (2 of 2)]
[im 1/1]
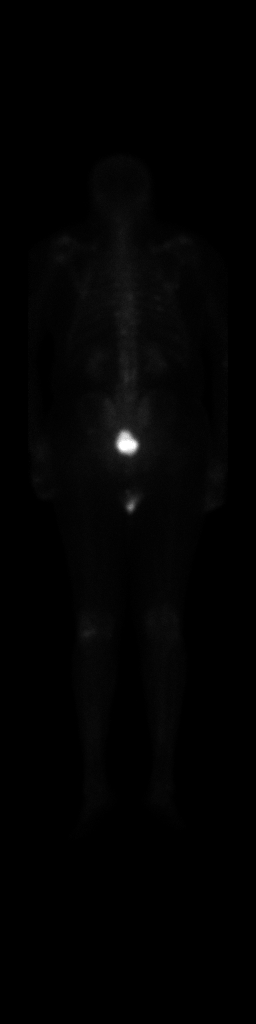

[2 of 2 positions shown; findings below may reference images not displayed]

FINDINGS: Several new discrete foci of radiotracer accumulation in the
posterior RIGHT ribs. Additionally several foci of new uptake within
the thoracic spine vertebral bodies. These findings are seen on the
posterior projection. Subtle lesion in the LEFT ischium laterally.
This lesion corresponds to the smudgy 10 mm sclerotic lesion on
coronal CT image 60/4).
IMPRESSION: 1. New foci of radiotracer uptake within the posterior RIGHT ribs,
midthoracic vertebral bodies bodies and LEFT ischium consistent with
progression skeletal metastasis.
2. The LEFT ischial lesion is represented on comparison same day CT.

## 2019-11-08 NOTE — Progress Notes (Signed)
Talked w/ pt's wife to discuss the Cancer Care letter they received.  Since [place] enrolled the pt in this foundation I don't have any paperwork about pt's approval so I suggested she call the foundation and request that the grant stay open.  I also requested they give me a copy of his approval so I can start submitting claims if they will cover his Taxotere.  Because the Prostate grant is closed on the site, I cannot verify if that drug is covered so Mrs. Wessels will verify that when she calls them.  I plan to see pt on 11/16/19 to retreive his paperwork from the [facility].

## 2019-11-08 NOTE — Progress Notes (Signed)
Rcv'd msg from [place] in Pharmacy stating that pt's wife called regarding a Cancer Care program co-pay assistance grant letter that she received. She has questions, so I called and left a msg requesting she return my call to discuss her concerns.

## 2019-11-10 NOTE — Progress Notes (Signed)
Pharmacist Chemotherapy Monitoring - Follow Up Assessment    I verify that I have reviewed each item in the below checklist:  . Regimen for the patient is scheduled for the appropriate day and plan matches scheduled date. Marland Kitchen Appropriate non-routine labs are ordered dependent on drug ordered. . If applicable, additional medications reviewed and ordered per protocol based on lifetime cumulative doses and/or treatment regimen.   Plan for follow-up and/or issues identified: No . I-vent associated with next due treatment: No . MD and/or nursing notified: No  Wynona Neat 11/10/2019 9:39 AM

## 2019-11-16 ENCOUNTER — Inpatient Hospital Stay: Payer: Medicare Other | Attending: Oncology

## 2019-11-16 ENCOUNTER — Inpatient Hospital Stay: Payer: Medicare Other

## 2019-11-16 ENCOUNTER — Inpatient Hospital Stay (HOSPITAL_BASED_OUTPATIENT_CLINIC_OR_DEPARTMENT_OTHER): Payer: Medicare Other | Admitting: Oncology

## 2019-11-16 ENCOUNTER — Other Ambulatory Visit: Payer: Self-pay

## 2019-11-16 VITALS — BP 147/84 | HR 100 | Temp 98.7°F | Resp 18 | Ht 70.0 in | Wt 216.3 lb

## 2019-11-16 DIAGNOSIS — Z5111 Encounter for antineoplastic chemotherapy: Secondary | ICD-10-CM | POA: Diagnosis not present

## 2019-11-16 DIAGNOSIS — C61 Malignant neoplasm of prostate: Secondary | ICD-10-CM | POA: Diagnosis present

## 2019-11-16 DIAGNOSIS — C7951 Secondary malignant neoplasm of bone: Secondary | ICD-10-CM | POA: Diagnosis not present

## 2019-11-16 DIAGNOSIS — Z5189 Encounter for other specified aftercare: Secondary | ICD-10-CM | POA: Insufficient documentation

## 2019-11-16 DIAGNOSIS — Z95828 Presence of other vascular implants and grafts: Secondary | ICD-10-CM

## 2019-11-16 LAB — CBC WITH DIFFERENTIAL (CANCER CENTER ONLY)
Abs Immature Granulocytes: 0.15 10*3/uL — ABNORMAL HIGH (ref 0.00–0.07)
Basophils Absolute: 0 10*3/uL (ref 0.0–0.1)
Basophils Relative: 1 %
Eosinophils Absolute: 0 10*3/uL (ref 0.0–0.5)
Eosinophils Relative: 0 %
HCT: 34.2 % — ABNORMAL LOW (ref 39.0–52.0)
Hemoglobin: 10.7 g/dL — ABNORMAL LOW (ref 13.0–17.0)
Immature Granulocytes: 2 %
Lymphocytes Relative: 30 %
Lymphs Abs: 2.2 10*3/uL (ref 0.7–4.0)
MCH: 28.8 pg (ref 26.0–34.0)
MCHC: 31.3 g/dL (ref 30.0–36.0)
MCV: 91.9 fL (ref 80.0–100.0)
Monocytes Absolute: 1.1 10*3/uL — ABNORMAL HIGH (ref 0.1–1.0)
Monocytes Relative: 14 %
Neutro Abs: 3.9 10*3/uL (ref 1.7–7.7)
Neutrophils Relative %: 53 %
Platelet Count: 199 10*3/uL (ref 150–400)
RBC: 3.72 MIL/uL — ABNORMAL LOW (ref 4.22–5.81)
RDW: 20.2 % — ABNORMAL HIGH (ref 11.5–15.5)
WBC Count: 7.4 10*3/uL (ref 4.0–10.5)
nRBC: 0.5 % — ABNORMAL HIGH (ref 0.0–0.2)

## 2019-11-16 LAB — CMP (CANCER CENTER ONLY)
ALT: 23 U/L (ref 0–44)
AST: 23 U/L (ref 15–41)
Albumin: 3.8 g/dL (ref 3.5–5.0)
Alkaline Phosphatase: 207 U/L — ABNORMAL HIGH (ref 38–126)
Anion gap: 6 (ref 5–15)
BUN: 12 mg/dL (ref 8–23)
CO2: 22 mmol/L (ref 22–32)
Calcium: 9.1 mg/dL (ref 8.9–10.3)
Chloride: 111 mmol/L (ref 98–111)
Creatinine: 0.79 mg/dL (ref 0.61–1.24)
GFR, Est AFR Am: >60 mL/min (ref >60)
GFR, Estimated: >60 mL/min (ref >60)
Glucose, Bld: 95 mg/dL (ref 70–99)
Potassium: 4.1 mmol/L (ref 3.5–5.1)
Sodium: 139 mmol/L (ref 135–145)
Total Bilirubin: 0.4 mg/dL (ref 0.3–1.2)
Total Protein: 7.7 g/dL (ref 6.5–8.1)

## 2019-11-16 MED ORDER — SODIUM CHLORIDE 0.9% FLUSH
10.0000 mL | INTRAVENOUS | Status: DC | PRN
  Administered 2019-11-16: 10 mL via INTRAVENOUS
  Filled 2019-11-16: qty 10

## 2019-11-16 MED ORDER — HEPARIN SOD (PORK) LOCK FLUSH 100 UNIT/ML IV SOLN
500.0000 [IU] | Freq: Once | INTRAVENOUS | Status: AC | PRN
  Administered 2019-11-16: 500 [IU]
  Filled 2019-11-16: qty 5

## 2019-11-16 MED ORDER — SODIUM CHLORIDE 0.9 % IV SOLN
60.0000 mg/m2 | Freq: Once | INTRAVENOUS | Status: AC
  Administered 2019-11-16: 130 mg via INTRAVENOUS
  Filled 2019-11-16: qty 13

## 2019-11-16 MED ORDER — SODIUM CHLORIDE 0.9 % IV SOLN
10.0000 mg | Freq: Once | INTRAVENOUS | Status: AC
  Administered 2019-11-16: 10 mg via INTRAVENOUS
  Filled 2019-11-16: qty 10

## 2019-11-16 MED ORDER — SODIUM CHLORIDE 0.9% FLUSH
10.0000 mL | INTRAVENOUS | Status: DC | PRN
  Administered 2019-11-16: 10 mL
  Filled 2019-11-16: qty 10

## 2019-11-16 MED ORDER — SODIUM CHLORIDE 0.9 % IV SOLN
Freq: Once | INTRAVENOUS | Status: AC
  Filled 2019-11-16: qty 250

## 2019-11-16 NOTE — Progress Notes (Signed)
Hematology and Oncology Follow Up Visit  ARIAN BUTTERS [id number] 1936/01/12 84 y.o. 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  He was diagnosed in 2008 with localized disease and Gleason score 4+4 = 8.  Prior Therapy: He was treated with androgen deprivation as well as definitive radiation therapy. His PSA nadir was 0.5 in 2009.   He developed advanced disease with PSA in February 2015 was up to 36.35 and a fluoride PET scan showed measurable disease in the second rib as well as the 10th thoracic vertebral body. He was started on androgen deprivation since that time. He had an excellent PSA response initially with a nadir down to 0.08 in April 2016.   His PSA was 0.8 in February 2017 and Casodex was added.   In April 2017 his PSA was 0.28 and started to rise again in July 2017. His PSA was 0.44 and subsequently was 1.09 in October 2017. On 10/09/2016 his PSA was 3.39. His testosterone level was 30.4. Casodex was discontinued in April 2018. His PSA was 7.7 in August 2018.  Xtandi 160 mg daily started on 04/08/2017.  Therapy discontinued in November 2020 for progression of disease.   Current therapy:  Taxotere chemotherapy 75 mg per metered square started  on July 13, 2019.  He is currently receiving 60 mg/m every 3 weeks and here for cycle 5 of therapy.    [facility]rogen deprivation received with [facility].  Interim History: Mr. Blackledge returns today for a repeat evaluation.  Since the last visit, he reports no major changes in his health.  He tolerated chemotherapy without any recent complaints.  He denies any nausea, vomiting or abdominal pain.  He denies any worsening neuropathy or fatigue.  He is eating better and continues to maintain his weight.  His performance status and quality of life is unchanged.   Medications: Updated on  review. Current Outpatient Medications  Medication Sig Dispense Refill  . alendronate (FOSAMAX) 70 MG tablet Take 70 mg by mouth once a week.     Marland Kitchen amLODipine (NORVASC) 10 MG tablet TAKE 1 TABLET (10 MG TOTAL) DAILY BY MOUTH. 90 tablet 3  . amoxicillin (AMOXIL) 500 MG capsule Take 500 mg by mouth 3 (three) times daily.    . [employer] MAGNESIUM CITRATE 1.745 GM/30ML SOLN PLEASE SEE ATTACHED FOR DETAILED DIRECTIONS    . enzalutamide (XTANDI) 40 MG capsule Take by mouth.    . Hypromellose (ARTIFICIAL TEARS OP) Place 1 drop into the left eye daily.    . Hypromellose 0.3 % SOLN Apply to eye.    . ILEVRO 0.3 % ophthalmic suspension Place 1 drop into the right eye daily.   2  . lidocaine (LIDODERM) 5 % Place 1 patch onto the skin daily. Remove & Discard patch within 12 hours or as directed by MD 30 patch 0  . losartan (COZAAR) 100 MG tablet Take 100 mg by mouth daily.    . megestrol (MEGACE) 400 MG/10ML suspension Take 10 mLs (400 mg total) by mouth 2 (two) times daily. 240 mL 0  . metFORMIN (GLUCOPHAGE-XR) 500 MG 24 hr tablet Take 500 mg by mouth every morning.    . potassium chloride (KLOR-CON) 10 MEQ tablet Take 1 tablet (10 mEq total) by mouth daily. 90 tablet 3  . pravastatin (PRAVACHOL) 20 MG tablet TAKE 1 TABLET BY MOUTH EVERY DAY IN THE EVENING (Patient taking differently: Take 20  mg by mouth daily. ) 90 tablet 3  . prochlorperazine (COMPAZINE) 10 MG tablet Take 1 tablet (10 mg total) by mouth every 6 (six) hours as needed for nausea or vomiting. 30 tablet 0  . senna-docusate (SENNA S) 8.6-50 MG tablet Take 1 tablet by mouth 2 (two) times daily. 60 tablet 3   No current facility-administered medications for this visit.   Facility-Administered Medications Ordered in Other Visits  Medication Dose Route Frequency Provider Last Rate Last Admin  . sodium chloride flush (NS) 0.9 % injection 10 mL  10 mL Intravenous PRN Wyatt Portela, MD   10 mL at 11/16/19 1135     Allergies:  Allergies   Allergen Reactions  . Aspirin Nausea Only    Patient stated,"I get nauseated with higher doses of Aspirin." Patient stated,"I get nauseated with higher doses of Aspirin." Patient stated,"I get nauseated with higher doses of Aspirin."      Physical Exam:     Blood pressure (!) 147/84, pulse 100, temperature 98.7 F (37.1 C), temperature source Temporal, resp. rate 18, height 5\' 10"  (1.778 m), weight 216 lb 4.8 oz (98.1 kg), SpO2 100 %.     ECOG: 1     General appearance: Alert, awake without any distress. Head: Atraumatic without abnormalities Oropharynx: Without any thrush or ulcers. Eyes: No scleral icterus. Lymph nodes: No lymphadenopathy noted in the cervical, supraclavicular, or axillary nodes Heart:regular rate and rhythm, without any murmurs or gallops.   Lung: Clear to auscultation without any rhonchi, wheezes or dullness to percussion. Abdomin: Soft, nontender without any shifting dullness or ascites. Musculoskeletal: No clubbing or cyanosis. Neurological: No motor or sensory deficits. Skin: No rashes or lesions.                  Lab Results: Lab Results  Component Value Date   WBC 6.9 10/26/2019   HGB 9.7 (L) 10/26/2019   HCT 31.6 (L) 10/26/2019   MCV 91.1 10/26/2019   PLT 217 10/26/2019     Chemistry      Component Value Date/Time   NA 141 10/26/2019 0815   NA 141 08/12/2017 1258   K 3.9 10/26/2019 0815   K 3.4 (L) 08/12/2017 1258   CL 110 10/26/2019 0815   CO2 22 10/26/2019 0815   CO2 27 08/12/2017 1258   BUN 7 (L) 10/26/2019 0815   BUN 14.9 08/12/2017 1258   CREATININE 0.73 10/26/2019 0815   CREATININE 1.0 08/12/2017 1258      Component Value Date/Time   CALCIUM 8.5 (L) 10/26/2019 0815   CALCIUM 9.5 08/12/2017 1258   ALKPHOS 175 (H) 10/26/2019 0815   ALKPHOS 62 08/12/2017 1258   AST 19 10/26/2019 0815   AST 36 (H) 08/12/2017 1258   ALT 15 10/26/2019 0815   ALT 43 08/12/2017 1258   BILITOT 0.3 10/26/2019 0815    BILITOT 0.34 08/12/2017 1258         Results for FARDEEN, TREASURE I (MRN [id number]) as of 11/16/2019 11:44  Ref. Range 10/05/2019 11:42 10/26/2019 08:15  Prostate Specific Ag, Serum Latest Ref Range: 0.0 - 4.0 ng/mL 127.0 (H) 67.4 (H)         Impression and Plan:   84 year old man with:  1.  Advanced prostate cancer with lymphadenopathy since 2018.  He has castration-resistant at this time.   He continues to tolerate Taxotere chemotherapy without any major complications at the current dose.  His PSA is showing reasonable response to therapy with declined to 67.4.  Risks and benefits of continuing this therapy were reviewed today.  Complications that include nausea, vomiting, myelosuppression or worsening neuropathy were reiterated.  Alternative options include discontinuation of chemotherapy and supportive management only.  At this time, chemotherapy has maintained his quality of life and he is willing to continue.   2. Androgen deprivation therapy: He is continue to receive that under the care of [facility] which I recommended continuing indefinitely.  3. Bone directed therapy: Delton See has been deferred for the time being.  He is on calcium and vitamin D supplements.  4.  Prognosis and goals of care: His disease is incurable although aggressive measures are warranted given his reasonable performance status.  Treatment is palliative however.  5.  Anorexia: Resolved and currently maintaining weight.  6.  IV access: Port-A-Cath remains in place without any issues.  7.  Dental considerations: Completed dental work without any complications.  8.  Growth factor support: He will continue to receive growth factor support after each cycle of therapy given his high risk of neutropenia and sepsis.  9. Follow-up: He will return in 3 weeks for the next cycle of therapy.   30  minutes were spent on this encounter.  Time was dedicated to reviewing complications related to therapy,  alternative treatment options and future plan of care discussion.     Zola Button, MD 4/8/202111:42 AM

## 2019-11-17 ENCOUNTER — Telehealth: Payer: Self-pay

## 2019-11-17 ENCOUNTER — Telehealth: Payer: Self-pay | Admitting: Oncology

## 2019-11-17 LAB — PROSTATE-SPECIFIC AG, SERUM (LABCORP): Prostate Specific Ag, Serum: 63.2 ng/mL — ABNORMAL HIGH (ref 0.0–4.0)

## 2019-11-17 NOTE — Telephone Encounter (Signed)
Scheduled appt per 4/8 los. °

## 2019-11-17 NOTE — Telephone Encounter (Signed)
-----   Message from Wyatt Portela, MD sent at 11/17/2019  8:06 AM EDT ----- Please let him know his PSA is down

## 2019-11-17 NOTE — Telephone Encounter (Signed)
Called patient and made him aware of PSA result. Left message on voicemail.

## 2019-11-18 ENCOUNTER — Other Ambulatory Visit: Payer: Self-pay

## 2019-11-18 ENCOUNTER — Inpatient Hospital Stay: Payer: Medicare Other

## 2019-11-18 VITALS — BP 148/75 | HR 110 | Temp 98.7°F | Resp 17

## 2019-11-18 DIAGNOSIS — C61 Malignant neoplasm of prostate: Secondary | ICD-10-CM

## 2019-11-18 DIAGNOSIS — Z5189 Encounter for other specified aftercare: Secondary | ICD-10-CM | POA: Diagnosis not present

## 2019-11-18 DIAGNOSIS — C7951 Secondary malignant neoplasm of bone: Secondary | ICD-10-CM | POA: Diagnosis not present

## 2019-11-18 DIAGNOSIS — Z5111 Encounter for antineoplastic chemotherapy: Secondary | ICD-10-CM | POA: Diagnosis not present

## 2019-11-18 MED ORDER — PEGFILGRASTIM INJECTION 6 MG/0.6ML ~~LOC~~
6.0000 mg | PREFILLED_SYRINGE | Freq: Once | SUBCUTANEOUS | Status: AC
  Administered 2019-11-18: 6 mg via SUBCUTANEOUS

## 2019-11-18 NOTE — Patient Instructions (Signed)

## 2019-11-22 ENCOUNTER — Ambulatory Visit: Payer: Medicare Other | Attending: Internal Medicine

## 2019-11-22 DIAGNOSIS — Z23 Encounter for immunization: Secondary | ICD-10-CM

## 2019-11-22 NOTE — Progress Notes (Signed)
   Covid-19 Vaccination Clinic  Name:  [name]    MRN: [id number] DOB: 1935/12/30  11/22/2019  Mr. Dhami was observed post Covid-19 immunization for 15 minutes without incident. He was provided with Vaccine Information Sheet and instruction to access the V-Safe system.   Mr. Adjei was instructed to call 911 with any severe reactions post vaccine: Marland Kitchen Difficulty breathing  . Swelling of face and throat  . A fast heartbeat  . A bad rash all over body  . Dizziness and weakness   Immunizations Administered    Name Date Dose VIS Date Route   Pfizer COVID-19 Vaccine 11/22/2019 10:05 AM 0.3 mL 07/21/2019 Intramuscular   Manufacturer: [place]   Lot: [id number]   [place]: [id number]

## 2019-11-27 ENCOUNTER — Encounter: Payer: Self-pay | Admitting: Student in an Organized Health Care Education/Training Program

## 2019-11-27 ENCOUNTER — Ambulatory Visit (INDEPENDENT_AMBULATORY_CARE_PROVIDER_SITE_OTHER): Payer: Medicare Other | Admitting: Student in an Organized Health Care Education/Training Program

## 2019-11-27 DIAGNOSIS — I10 Essential (primary) hypertension: Secondary | ICD-10-CM

## 2019-11-27 DIAGNOSIS — Z79899 Other long term (current) drug therapy: Secondary | ICD-10-CM | POA: Diagnosis not present

## 2019-11-27 DIAGNOSIS — E1169 Type 2 diabetes mellitus with other specified complication: Secondary | ICD-10-CM | POA: Diagnosis not present

## 2019-11-27 NOTE — Assessment & Plan Note (Addendum)
Last hemoglobin A1c 5.8%.  We discontinued the low-dose Metformin because he was having unintentional weight loss, most likely related to chemotherapy.  He does not check his blood sugars, but has had no symptomatic hypoglycemia.  Plan to continue holding Metformin.  He is gaining some weight back as his cancer gets under control.  Follow-up in 2 months for an A1c check.  Goal A1c in his case is around 7%.  Has been 3 years since he saw Dr. Amalia Hailey with podiatry.  I am going to place this referral again, he reports some overgrown toenails that need careful debridement to prevent a diabetic foot wound.

## 2019-11-27 NOTE — Progress Notes (Signed)
  [facility] Telephone Encounter Continuity Care Appointment  HPI:   This telephone encounter was created for [name] on 11/27/2019 for the following purpose/cc follow-up diabetes.  84 year old person living with metastatic prostate cancer, hypertension and diabetes here for follow-up today.  I last saw the patient in January when he was struggling with his prostate cancer.  He recently had a recurrence and was on Taxotere chemotherapy since December which had caused some significant weight loss.  He was having hypotension which were symptomatic.  At that time we discontinued losartan, chlorthalidone, and Metformin.  He is followed up regularly with the cancer center been able to continue with the chemotherapy regimen.  Blood pressures have been near normal at the cancer center.  Has any symptomatic hypoglycemia.  He reports better energy.  He is independent in his activities of daily living.  Reports feeling good without pain control.  Received second dose of Covid vaccine a few days ago and is having some myalgias as a result.   Past Medical History:  Past Medical History:  Diagnosis Date  . Adenomatous colon polyp 12/14/2005   5 mm polyp in descending colon, found on colonoscopy by Dr. Wilford Corner  . Allergic rhinitis   . Asymptomatic cholelithiasis 11/30/2014  . Bladder calculi   . Bladder cancer ([facility]) 2010   S/P TURBT   . Bladder neck contracture   . Enlarged heart    "slight" (09/09/2016)  . GERD (gastroesophageal reflux disease)   . Hand eczema   . Hearing loss 11/30/2016  . Hepatitis, unspecified PT STATES HAD HEPATITIS APPROX 1980'S ; UNSURE WHAT TYPE   Hepatitis C RNA Quant on July 06, 2007 showed no detectable virus.  . High cholesterol   . History of kidney stones   . Hypertension   . Nocturia   . OSA on CPAP    Severe by diagnostic polysomnogram 06/22/2004.  Marland Kitchen Prostate cancer ([place]) 2009   S/P EXTERNAL RADIATION, SEED  IMPLANTS  . Self-catheterizes urinary bladder    "q couple weeks prn" (09/09/2016)  . Sleep apnea    "don't use the mask anymore" (09/09/2016)      ROS:      Assessment / Plan / Recommendations:   Please see A&P under problem oriented charting for assessment of the patient's acute and chronic medical conditions.   As always, pt is advised that if symptoms worsen or new symptoms arise, they should go to an urgent care facility or to to ER for further evaluation.   Consent and Medical Decision Making:    This is a telephone encounter between [name] and [name] on 11/27/2019 for Diabetes. The visit was conducted with the patient located at home and [name] at [facility]. The patient's identity was confirmed using their DOB and current address. The patient has consented to being evaluated through a telephone encounter and understands the associated risks (an examination cannot be done and the patient may need to come in for an appointment) / benefits (allows the patient to remain at home, decreasing exposure to coronavirus). I personally spent 14 minutes on medical discussion.

## 2019-11-27 NOTE — Assessment & Plan Note (Signed)
Pressure at our last visit was low in the setting of weight loss related to chemotherapy.  Last few blood pressure readings at the cancer center have been much more normal.  He is on amlodipine 10 mg daily only.  We had discontinued losartan and chlorthalidone because of the hypotension.  I will continue to hold off on these medications.  Follow-up with [place] in 2 months for blood pressure recheck.

## 2019-12-01 NOTE — Progress Notes (Signed)
Pharmacist Chemotherapy Monitoring - Follow Up Assessment    I verify that I have reviewed each item in the below checklist:  . Regimen for the patient is scheduled for the appropriate day and plan matches scheduled date. Marland Kitchen Appropriate non-routine labs are ordered dependent on drug ordered. . If applicable, additional medications reviewed and ordered per protocol based on lifetime cumulative doses and/or treatment regimen.   Plan for follow-up and/or issues identified: No . I-vent associated with next due treatment: No . MD and/or nursing notified: No  [name] 12/01/2019 10:29 AM

## 2019-12-06 ENCOUNTER — Encounter: Payer: Self-pay | Admitting: *Deleted

## 2019-12-07 ENCOUNTER — Inpatient Hospital Stay: Payer: Medicare Other

## 2019-12-07 ENCOUNTER — Telehealth: Payer: Self-pay | Admitting: Oncology

## 2019-12-07 ENCOUNTER — Other Ambulatory Visit: Payer: Self-pay

## 2019-12-07 ENCOUNTER — Inpatient Hospital Stay: Payer: Medicare Other | Admitting: Oncology

## 2019-12-07 VITALS — BP 148/82 | HR 78 | Temp 98.9°F | Resp 17 | Ht 70.0 in | Wt 221.0 lb

## 2019-12-07 DIAGNOSIS — C7951 Secondary malignant neoplasm of bone: Secondary | ICD-10-CM | POA: Diagnosis not present

## 2019-12-07 DIAGNOSIS — Z5111 Encounter for antineoplastic chemotherapy: Secondary | ICD-10-CM | POA: Diagnosis not present

## 2019-12-07 DIAGNOSIS — Z95828 Presence of other vascular implants and grafts: Secondary | ICD-10-CM

## 2019-12-07 DIAGNOSIS — Z5189 Encounter for other specified aftercare: Secondary | ICD-10-CM | POA: Diagnosis not present

## 2019-12-07 DIAGNOSIS — C61 Malignant neoplasm of prostate: Secondary | ICD-10-CM | POA: Diagnosis not present

## 2019-12-07 LAB — CMP (CANCER CENTER ONLY)
ALT: 19 U/L (ref 0–44)
AST: 22 U/L (ref 15–41)
Albumin: 3.5 g/dL (ref 3.5–5.0)
Alkaline Phosphatase: 190 U/L — ABNORMAL HIGH (ref 38–126)
Anion gap: 9 (ref 5–15)
BUN: 12 mg/dL (ref 8–23)
CO2: 22 mmol/L (ref 22–32)
Calcium: 9 mg/dL (ref 8.9–10.3)
Chloride: 111 mmol/L (ref 98–111)
Creatinine: 0.8 mg/dL (ref 0.61–1.24)
GFR, Est AFR Am: >60 mL/min (ref >60)
GFR, Estimated: >60 mL/min (ref >60)
Glucose, Bld: 128 mg/dL — ABNORMAL HIGH (ref 70–99)
Potassium: 4 mmol/L (ref 3.5–5.1)
Sodium: 142 mmol/L (ref 135–145)
Total Bilirubin: 0.3 mg/dL (ref 0.3–1.2)
Total Protein: 7.3 g/dL (ref 6.5–8.1)

## 2019-12-07 LAB — CBC WITH DIFFERENTIAL (CANCER CENTER ONLY)
Abs Immature Granulocytes: 0.12 10*3/uL — ABNORMAL HIGH (ref 0.00–0.07)
Basophils Absolute: 0 10*3/uL (ref 0.0–0.1)
Basophils Relative: 0 %
Eosinophils Absolute: 0 10*3/uL (ref 0.0–0.5)
Eosinophils Relative: 0 %
HCT: 31.9 % — ABNORMAL LOW (ref 39.0–52.0)
Hemoglobin: 10.1 g/dL — ABNORMAL LOW (ref 13.0–17.0)
Immature Granulocytes: 2 %
Lymphocytes Relative: 35 %
Lymphs Abs: 2.1 10*3/uL (ref 0.7–4.0)
MCH: 29.4 pg (ref 26.0–34.0)
MCHC: 31.7 g/dL (ref 30.0–36.0)
MCV: 93 fL (ref 80.0–100.0)
Monocytes Absolute: 0.9 10*3/uL (ref 0.1–1.0)
Monocytes Relative: 15 %
Neutro Abs: 2.8 10*3/uL (ref 1.7–7.7)
Neutrophils Relative %: 48 %
Platelet Count: 185 10*3/uL (ref 150–400)
RBC: 3.43 MIL/uL — ABNORMAL LOW (ref 4.22–5.81)
RDW: 20.3 % — ABNORMAL HIGH (ref 11.5–15.5)
WBC Count: 5.8 10*3/uL (ref 4.0–10.5)
nRBC: 0 % (ref 0.0–0.2)

## 2019-12-07 MED ORDER — HEPARIN SOD (PORK) LOCK FLUSH 100 UNIT/ML IV SOLN
500.0000 [IU] | Freq: Once | INTRAVENOUS | Status: AC | PRN
  Administered 2019-12-07: 500 [IU]
  Filled 2019-12-07: qty 5

## 2019-12-07 MED ORDER — SODIUM CHLORIDE 0.9% FLUSH
10.0000 mL | INTRAVENOUS | Status: DC | PRN
  Administered 2019-12-07: 10 mL
  Filled 2019-12-07: qty 10

## 2019-12-07 MED ORDER — SODIUM CHLORIDE 0.9 % IV SOLN
60.0000 mg/m2 | Freq: Once | INTRAVENOUS | Status: AC
  Administered 2019-12-07: 130 mg via INTRAVENOUS
  Filled 2019-12-07: qty 13

## 2019-12-07 MED ORDER — SODIUM CHLORIDE 0.9% FLUSH
10.0000 mL | INTRAVENOUS | Status: DC | PRN
  Administered 2019-12-07: 10 mL via INTRAVENOUS
  Filled 2019-12-07: qty 10

## 2019-12-07 MED ORDER — SODIUM CHLORIDE 0.9 % IV SOLN
10.0000 mg | Freq: Once | INTRAVENOUS | Status: AC
  Administered 2019-12-07: 10 mg via INTRAVENOUS
  Filled 2019-12-07: qty 10

## 2019-12-07 MED ORDER — SODIUM CHLORIDE 0.9 % IV SOLN
Freq: Once | INTRAVENOUS | Status: AC
  Filled 2019-12-07: qty 250

## 2019-12-07 NOTE — Telephone Encounter (Signed)
Scheduled appt per 4/29 los. °

## 2019-12-07 NOTE — Progress Notes (Signed)
Hematology and Oncology Follow Up Visit  [name]:[id number] [birth year] 84 y.o. 12/07/2019 11:42 AM [name], MDVincent, [name],*   Principle Diagnosis: 84 year old man with prostate cancer diagnosed in 2008 with a Gleason score 4+4 = 8..  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 Prior Therapy: [name] was treated with androgen deprivation as well as definitive radiation therapy. His PSA nadir was 0.5 in 2009.   [name] developed advanced disease with PSA in February 2015 was up to 36.35 and a fluoride PET scan showed measurable disease in the second rib as well as the 10th thoracic vertebral body. [name] was started on androgen deprivation since that time. [name] had an excellent PSA response initially with a nadir down to 0.08 in April 2016.   His PSA was 0.8 in February 2017 and Casodex was added.   In April 2017 his PSA was 0.28 and started to rise again in July 2017. His PSA was 0.44 and subsequently was 1.09 in October 2017. On 10/09/2016 his PSA was 3.39. His testosterone level was 30.4. Casodex was discontinued in April 2018. His PSA was 7.7 in August 2018.  Xtandi 160 mg daily started on 04/08/2017.  Therapy discontinued in November 2020 for progression of disease.   Current therapy:  Taxotere chemotherapy 75 mg per metered square started  on July 13, 2019.  [name] is currently receiving 60 mg/m every 3 weeks and here for cycle 6 of therapy.    [facility]rogen deprivation received with [facility].  Interim History: [name] is here for a follow-up visit.  Since the last visit, [name] reports no major changes in his health.  [name] continues to tolerate chemotherapy without any new complications.  [name] denies any nausea, vomiting or pain.  [name] continues to eat better and has gained more weight.  His performance status and quality of life continues to improve on chemotherapy.  Denies any worsening neuropathy or excessive fatigue.  [name] is able to drive  starting since.   Medications: Reviewed without changes. Current Outpatient Medications  Medication Sig Dispense Refill  . alendronate (FOSAMAX) 70 MG tablet Take 70 mg by mouth once a week.     Marland Kitchen amLODipine (NORVASC) 10 MG tablet TAKE 1 TABLET (10 MG TOTAL) DAILY BY MOUTH. 90 tablet 3  . [facility] MAGNESIUM CITRATE 1.745 GM/30ML SOLN PLEASE SEE ATTACHED FOR DETAILED DIRECTIONS    . enzalutamide (XTANDI) 40 MG capsule Take by mouth.    . Hypromellose (ARTIFICIAL TEARS OP) Place 1 drop into the left eye daily.    . Hypromellose 0.3 % SOLN Apply to eye.    . ILEVRO 0.3 % ophthalmic suspension Place 1 drop into the right eye daily.   2  . lidocaine (LIDODERM) 5 % Place 1 patch onto the skin daily. Remove & Discard patch within 12 hours or as directed by MD 30 patch 0  . megestrol (MEGACE) 400 MG/10ML suspension Take 10 mLs (400 mg total) by mouth 2 (two) times daily. 240 mL 0  . pravastatin (PRAVACHOL) 20 MG tablet TAKE 1 TABLET BY MOUTH EVERY DAY IN THE EVENING (Patient taking differently: Take 20 mg by mouth daily. ) 90 tablet 3  . prochlorperazine (COMPAZINE) 10 MG tablet Take 1 tablet (10 mg total) by mouth every 6 (six) hours as needed for nausea or vomiting. 30 tablet 0  . senna-docusate (SENNA S) 8.6-50 MG tablet Take 1 tablet by mouth 2 (two) times daily. 60 tablet 3   No current facility-administered  medications for this visit.   Facility-Administered Medications Ordered in Other Visits  Medication Dose Route Frequency Provider Last Rate Last Admin  . sodium chloride flush (NS) 0.9 % injection 10 mL  10 mL Intravenous PRN Wyatt Portela, MD   10 mL at 12/07/19 1135     Allergies:  Allergies  Allergen Reactions  . Aspirin Nausea Only    Patient stated,"I get nauseated with higher doses of Aspirin." Patient stated,"I get nauseated with higher doses of Aspirin." Patient stated,"I get nauseated with higher doses of Aspirin."      Physical Exam:    Blood pressure (!) 148/82,  pulse 78, temperature 98.9 F (37.2 C), temperature source Temporal, resp. rate 17, height 5\' 10"  (1.778 m), weight 221 lb (100.2 kg), SpO2 100 %.       ECOG: 1      General appearance: Comfortable appearing without any discomfort Head: Normocephalic without any trauma Oropharynx: Mucous membranes are moist and pink without any thrush or ulcers. Eyes: Pupils are equal and round reactive to light. Lymph nodes: No cervical, supraclavicular, inguinal or axillary lymphadenopathy.   Heart:regular rate and rhythm.  S1 and S2 without leg edema. Lung: Clear without any rhonchi or wheezes.  No dullness to percussion. Abdomin: Soft, nontender, nondistended with good bowel sounds.  No hepatosplenomegaly. Musculoskeletal: No joint deformity or effusion.  Full range of motion noted. Neurological: No deficits noted on motor, sensory and deep tendon reflex exam. Skin: No petechial rash or dryness.  Appeared moist.                   Lab Results: Lab Results  Component Value Date   WBC 7.4 11/16/2019   HGB 10.7 (L) 11/16/2019   HCT 34.2 (L) 11/16/2019   MCV 91.9 11/16/2019   PLT 199 11/16/2019     Chemistry      Component Value Date/Time   NA 139 11/16/2019 1135   NA 141 08/12/2017 1258   K 4.1 11/16/2019 1135   K 3.4 (L) 08/12/2017 1258   CL 111 11/16/2019 1135   CO2 22 11/16/2019 1135   CO2 27 08/12/2017 1258   BUN 12 11/16/2019 1135   BUN 14.9 08/12/2017 1258   CREATININE 0.79 11/16/2019 1135   CREATININE 1.0 08/12/2017 1258      Component Value Date/Time   CALCIUM 9.1 11/16/2019 1135   CALCIUM 9.5 08/12/2017 1258   ALKPHOS 207 (H) 11/16/2019 1135   ALKPHOS 62 08/12/2017 1258   AST 23 11/16/2019 1135   AST 36 (H) 08/12/2017 1258   ALT 23 11/16/2019 1135   ALT 43 08/12/2017 1258   BILITOT 0.4 11/16/2019 1135   BILITOT 0.34 08/12/2017 1258               Results for [name] I (MRN [id number]) as of 12/07/2019 11:44  Ref. Range 10/26/2019  08:15 11/16/2019 11:35  Prostate Specific Ag, Serum Latest Ref Range: 0.0 - 4.0 ng/mL 67.4 (H) 63.2 (H)     Impression and Plan:   84 year old man with:  1.  Castration-resistant prostate cancer with disease to the bone diagnosed in 2018.    [name] is currently on Taxotere chemotherapy which she has tolerated reasonably well with the current dose.  His PSA is experiencing a reasonable response to therapy currently had a 50% drop from the initial pretreatment numbers.  Risks and benefits of continuing this treatment were reviewed at this time.  Potential complications include nausea, fatigue, myelosuppression among others.  [name]  is agreeable to continue at this time.  Tentatively to complete 10 cycles.   2. Androgen deprivation therapy: I recommended continuing this indefinitely.  [name] is currently receiving it under the care of [facility].  3. Bone directed therapy: [name] is on calcium and vitamin D supplements and Delton See has been deferred for the time being.   4.  Prognosis and goals of care: Therapy remains palliative although aggressive measures are warranted given his reasonable performance status..  5.  Anorexia: [name] is eating better at this time and has continued to gain weight.  6.  IV access: Port-A-Cath currently in use without any issues or complications.  7.  Growth factor support: This will be continued after each cycle of therapy given his high risk of neutropenia and sepsis.  8. Follow-up: In 3 weeks for follow-up and evaluation prior to next cycle of therapy.   30  minutes were dedicated to this visit.  The time was spent on updating his disease status, reviewing laboratory data, discussing complications related to therapy and future plan of care.    Zola Button, MD 4/29/202111:42 AM

## 2019-12-07 NOTE — Patient Instructions (Signed)
[facility] Discharge Instructions for Patients Receiving Chemotherapy  Today you received the following chemotherapy agent: Taxotere  To help prevent nausea and vomiting after your treatment, we encourage you to take your nausea medication as directed by your MD.   If you develop nausea and vomiting that is not controlled by your nausea medication, call the clinic.   BELOW ARE SYMPTOMS THAT SHOULD BE REPORTED IMMEDIATELY:  *FEVER GREATER THAN 100.5 F  *CHILLS WITH OR WITHOUT FEVER  NAUSEA AND VOMITING THAT IS NOT CONTROLLED WITH YOUR NAUSEA MEDICATION  *UNUSUAL SHORTNESS OF BREATH  *UNUSUAL BRUISING OR BLEEDING  TENDERNESS IN MOUTH AND THROAT WITH OR WITHOUT PRESENCE OF ULCERS  *URINARY PROBLEMS  *BOWEL PROBLEMS  UNUSUAL RASH Items with * indicate a potential emergency and should be followed up as soon as possible.  Feel free to call the clinic should you have any questions or concerns. The clinic phone number is [phone].  Please show the [facility] at check-in to the Emergency Department and triage nurse.  Coronavirus (COVID-19) Are you at risk?  Are you at risk for the Coronavirus (COVID-19)?  To be considered HIGH RISK for Coronavirus (COVID-19), you have to meet the following criteria:  . Traveled to [place], [place], [place], [place] or [place]; or in the [place] to [place], [place], [place], or [place]; and have fever, cough, and shortness of breath within the last 2 weeks of travel OR . Been in close contact with a person diagnosed with COVID-19 within the last 2 weeks and have fever, cough, and shortness of breath . IF YOU DO NOT MEET THESE CRITERIA, YOU ARE CONSIDERED LOW RISK FOR COVID-19.  What to do if you are HIGH RISK for COVID-19?  Marland Kitchen If you are having a medical emergency, call 911. . Seek medical care right away. Before you go to a doctor's office, urgent care or emergency department, call ahead and tell them about  your recent travel, contact with someone diagnosed with COVID-19, and your symptoms. You should receive instructions from your physician's office regarding next steps of care.  . When you arrive at healthcare provider, tell the healthcare staff immediately you have returned from visiting [place], [place], [place], [place] or [place]; or traveled in the [place] to [place], [place], [place], or [place]; in the last two weeks or you have been in close contact with a person diagnosed with COVID-19 in the last 2 weeks.   . Tell the health care staff about your symptoms: fever, cough and shortness of breath. . After you have been seen by a medical provider, you will be either: o Tested for (COVID-19) and discharged home on quarantine except to seek medical care if symptoms worsen, and asked to  - Stay home and avoid contact with others until you get your results (4-5 days)  - Avoid travel on public transportation if possible (such as bus, train, or airplane) or o Sent to the Emergency Department by EMS for evaluation, COVID-19 testing, and possible admission depending on your condition and test results.  What to do if you are LOW RISK for COVID-19?  Reduce your risk of any infection by using the same precautions used for avoiding the common cold or flu:  Marland Kitchen Wash your hands often with soap and warm water for at least 20 seconds.  If soap and water are not readily available, use an alcohol-based hand sanitizer with at least 60% alcohol.  . If  coughing or sneezing, cover your mouth and nose by coughing or sneezing into the elbow areas of your shirt or coat, into a tissue or into your sleeve (not your hands). . Avoid shaking hands with others and consider head nods or verbal greetings only. . Avoid touching your eyes, nose, or mouth with unwashed hands.  . Avoid close contact with people who are sick. . Avoid places or events with large numbers of people in one location, like concerts or sporting  events. . Carefully consider travel plans you have or are making. . If you are planning any travel outside or inside the [place], visit the CDC's Travelers' Health webpage for the latest health notices. . If you have some symptoms but not all symptoms, continue to monitor at home and seek medical attention if your symptoms worsen. . If you are having a medical emergency, call 911.   [facility] / e-Visit: [email or web address]         [facility]: [facility]: [phone]                   [facility]: [phone]

## 2019-12-08 ENCOUNTER — Telehealth: Payer: Self-pay

## 2019-12-08 LAB — PROSTATE-SPECIFIC AG, SERUM (LABCORP): Prostate Specific Ag, Serum: 45.9 ng/mL — ABNORMAL HIGH (ref 0.0–4.0)

## 2019-12-08 NOTE — Telephone Encounter (Signed)
-----   Message from Wyatt Portela, MD sent at 12/08/2019  8:11 AM EDT ----- Please let him know his PSA is down.

## 2019-12-08 NOTE — Telephone Encounter (Signed)
Call placed to make patient aware of PSA result. Verbalized understanding.

## 2019-12-09 ENCOUNTER — Inpatient Hospital Stay: Payer: Medicare Other | Attending: Oncology

## 2019-12-09 ENCOUNTER — Other Ambulatory Visit: Payer: Self-pay

## 2019-12-09 VITALS — BP 149/71 | HR 93 | Temp 98.9°F | Resp 20

## 2019-12-09 DIAGNOSIS — C61 Malignant neoplasm of prostate: Secondary | ICD-10-CM | POA: Insufficient documentation

## 2019-12-09 DIAGNOSIS — C7951 Secondary malignant neoplasm of bone: Secondary | ICD-10-CM | POA: Diagnosis not present

## 2019-12-09 DIAGNOSIS — Z5111 Encounter for antineoplastic chemotherapy: Secondary | ICD-10-CM | POA: Diagnosis not present

## 2019-12-09 DIAGNOSIS — Z5189 Encounter for other specified aftercare: Secondary | ICD-10-CM | POA: Diagnosis not present

## 2019-12-09 MED ORDER — PEGFILGRASTIM INJECTION 6 MG/0.6ML ~~LOC~~
6.0000 mg | PREFILLED_SYRINGE | Freq: Once | SUBCUTANEOUS | Status: AC
  Administered 2019-12-09: 09:00:00 6 mg via SUBCUTANEOUS

## 2019-12-09 NOTE — Patient Instructions (Signed)

## 2019-12-22 NOTE — Progress Notes (Signed)
Pharmacist Chemotherapy Monitoring - Follow Up Assessment    I verify that I have reviewed each item in the below checklist:  . Regimen for the patient is scheduled for the appropriate day and plan matches scheduled date. Marland Kitchen Appropriate non-routine labs are ordered dependent on drug ordered. . If applicable, additional medications reviewed and ordered per protocol based on lifetime cumulative doses and/or treatment regimen.   Plan for follow-up and/or issues identified: No . I-vent associated with next due treatment: No . MD and/or nursing notified: No  Geraline Halberstadt K 12/22/2019 9:23 AM

## 2019-12-28 ENCOUNTER — Inpatient Hospital Stay: Payer: Medicare Other

## 2019-12-28 ENCOUNTER — Inpatient Hospital Stay: Payer: Medicare Other | Admitting: Oncology

## 2019-12-28 ENCOUNTER — Other Ambulatory Visit: Payer: Self-pay

## 2019-12-28 VITALS — BP 157/90 | HR 95 | Temp 97.7°F | Resp 18 | Ht 70.0 in | Wt 223.6 lb

## 2019-12-28 DIAGNOSIS — Z5189 Encounter for other specified aftercare: Secondary | ICD-10-CM | POA: Diagnosis not present

## 2019-12-28 DIAGNOSIS — C7951 Secondary malignant neoplasm of bone: Secondary | ICD-10-CM | POA: Diagnosis not present

## 2019-12-28 DIAGNOSIS — Z5111 Encounter for antineoplastic chemotherapy: Secondary | ICD-10-CM | POA: Diagnosis not present

## 2019-12-28 DIAGNOSIS — C61 Malignant neoplasm of prostate: Secondary | ICD-10-CM

## 2019-12-28 DIAGNOSIS — Z95828 Presence of other vascular implants and grafts: Secondary | ICD-10-CM

## 2019-12-28 LAB — CMP (CANCER CENTER ONLY)
ALT: 19 U/L (ref 0–44)
AST: 21 U/L (ref 15–41)
Albumin: 3.4 g/dL — ABNORMAL LOW (ref 3.5–5.0)
Alkaline Phosphatase: 152 U/L — ABNORMAL HIGH (ref 38–126)
Anion gap: 6 (ref 5–15)
BUN: 8 mg/dL (ref 8–23)
CO2: 22 mmol/L (ref 22–32)
Calcium: 8.4 mg/dL — ABNORMAL LOW (ref 8.9–10.3)
Chloride: 109 mmol/L (ref 98–111)
Creatinine: 0.84 mg/dL (ref 0.61–1.24)
GFR, Est AFR Am: >60 mL/min (ref >60)
GFR, Estimated: >60 mL/min (ref >60)
Glucose, Bld: 121 mg/dL — ABNORMAL HIGH (ref 70–99)
Potassium: 3.7 mmol/L (ref 3.5–5.1)
Sodium: 137 mmol/L (ref 135–145)
Total Bilirubin: 0.4 mg/dL (ref 0.3–1.2)
Total Protein: 6.9 g/dL (ref 6.5–8.1)

## 2019-12-28 LAB — CBC WITH DIFFERENTIAL (CANCER CENTER ONLY)
Abs Immature Granulocytes: 0.13 10*3/uL — ABNORMAL HIGH (ref 0.00–0.07)
Basophils Absolute: 0 10*3/uL (ref 0.0–0.1)
Basophils Relative: 0 %
Eosinophils Absolute: 0 10*3/uL (ref 0.0–0.5)
Eosinophils Relative: 0 %
HCT: 31.1 % — ABNORMAL LOW (ref 39.0–52.0)
Hemoglobin: 9.8 g/dL — ABNORMAL LOW (ref 13.0–17.0)
Immature Granulocytes: 2 %
Lymphocytes Relative: 29 %
Lymphs Abs: 1.9 10*3/uL (ref 0.7–4.0)
MCH: 29.9 pg (ref 26.0–34.0)
MCHC: 31.5 g/dL (ref 30.0–36.0)
MCV: 94.8 fL (ref 80.0–100.0)
Monocytes Absolute: 1 10*3/uL (ref 0.1–1.0)
Monocytes Relative: 14 %
Neutro Abs: 3.7 10*3/uL (ref 1.7–7.7)
Neutrophils Relative %: 55 %
Platelet Count: 134 10*3/uL — ABNORMAL LOW (ref 150–400)
RBC: 3.28 MIL/uL — ABNORMAL LOW (ref 4.22–5.81)
RDW: 19.1 % — ABNORMAL HIGH (ref 11.5–15.5)
WBC Count: 6.7 10*3/uL (ref 4.0–10.5)
nRBC: 0.3 % — ABNORMAL HIGH (ref 0.0–0.2)

## 2019-12-28 MED ORDER — SODIUM CHLORIDE 0.9 % IV SOLN
Freq: Once | INTRAVENOUS | Status: AC
  Filled 2019-12-28: qty 250

## 2019-12-28 MED ORDER — SODIUM CHLORIDE 0.9% FLUSH
10.0000 mL | INTRAVENOUS | Status: DC | PRN
  Administered 2019-12-28: 10 mL via INTRAVENOUS
  Filled 2019-12-28: qty 10

## 2019-12-28 MED ORDER — SODIUM CHLORIDE 0.9 % IV SOLN
60.0000 mg/m2 | Freq: Once | INTRAVENOUS | Status: AC
  Administered 2019-12-28: 130 mg via INTRAVENOUS
  Filled 2019-12-28: qty 13

## 2019-12-28 MED ORDER — SODIUM CHLORIDE 0.9% FLUSH
10.0000 mL | INTRAVENOUS | Status: DC | PRN
  Administered 2019-12-28: 10 mL
  Filled 2019-12-28: qty 10

## 2019-12-28 MED ORDER — SODIUM CHLORIDE 0.9 % IV SOLN
10.0000 mg | Freq: Once | INTRAVENOUS | Status: AC
  Administered 2019-12-28: 10 mg via INTRAVENOUS
  Filled 2019-12-28: qty 10

## 2019-12-28 MED ORDER — HEPARIN SOD (PORK) LOCK FLUSH 100 UNIT/ML IV SOLN
500.0000 [IU] | Freq: Once | INTRAVENOUS | Status: AC | PRN
  Administered 2019-12-28: 500 [IU]
  Filled 2019-12-28: qty 5

## 2019-12-28 NOTE — Patient Instructions (Signed)
[facility] Discharge Instructions for Patients Receiving Chemotherapy  Today you received the following chemotherapy agents: docetaxel.  To help prevent nausea and vomiting after your treatment, we encourage you to take your nausea medication as directed.   If you develop nausea and vomiting that is not controlled by your nausea medication, call the clinic.   BELOW ARE SYMPTOMS THAT SHOULD BE REPORTED IMMEDIATELY:  *FEVER GREATER THAN 100.5 F  *CHILLS WITH OR WITHOUT FEVER  NAUSEA AND VOMITING THAT IS NOT CONTROLLED WITH YOUR NAUSEA MEDICATION  *UNUSUAL SHORTNESS OF BREATH  *UNUSUAL BRUISING OR BLEEDING  TENDERNESS IN MOUTH AND THROAT WITH OR WITHOUT PRESENCE OF ULCERS  *URINARY PROBLEMS  *BOWEL PROBLEMS  UNUSUAL RASH Items with * indicate a potential emergency and should be followed up as soon as possible.  Feel free to call the clinic should you have any questions or concerns. The clinic phone number is [phone].  Please show the CHEMO ALERT CARD at check-in to the Emergency Department and triage nurse.   

## 2019-12-28 NOTE — Progress Notes (Signed)
Hematology and Oncology Follow Up Visit  QUEEN WEYMAN [id number] [birth year] 84 y.o. 12/28/2019 1:46 PM [name], MDVincent, [name],*   Principle Diagnosis: 84 year old man with castration-resistant prostate cancer with disease to the bone since 2018.  He was initially diagnosed with Gleason score 4+4 = 8 in 2008.   Prior Therapy: He was treated with androgen deprivation as well as definitive radiation therapy. His PSA nadir was 0.5 in 2009.   He developed advanced disease with PSA in February 2015 was up to 36.35 and a fluoride PET scan showed measurable disease in the second rib as well as the 10th thoracic vertebral body. He was started on androgen deprivation since that time. He had an excellent PSA response initially with a nadir down to 0.08 in April 2016.   His PSA was 0.8 in February 2017 and Casodex was added.   In April 2017 his PSA was 0.28 and started to rise again in July 2017. His PSA was 0.44 and subsequently was 1.09 in October 2017. On 10/09/2016 his PSA was 3.39. His testosterone level was 30.4. Casodex was discontinued in April 2018. His PSA was 7.7 in August 2018.  Xtandi 160 mg daily started on 04/08/2017.  Therapy discontinued in November 2020 for progression of disease.   Current therapy:  Taxotere chemotherapy 75 mg per metered square started  on July 13, 2019.  He is currently receiving 60 mg/m every 3 weeks and here for cycle 7 of therapy.    [facility] with [facility].  Interim History: Mr. Manaois is here for repeat evaluation.  Since the last visit, he reports no major changes in his health.  He continues to tolerate chemotherapy without any complications.  He denies any nausea, vomiting or abdominal pain.  He denies any bone pain or pathological fractures.  His performance status and quality of life remains maintained.   Medications: Updated on review. Current Outpatient Medications  Medication Sig  Dispense Refill  . alendronate (FOSAMAX) 70 MG tablet Take 70 mg by mouth once a week.     Marland Kitchen amLODipine (NORVASC) 10 MG tablet TAKE 1 TABLET (10 MG TOTAL) DAILY BY MOUTH. 90 tablet 3  . [facility] MAGNESIUM CITRATE 1.745 GM/30ML SOLN PLEASE SEE ATTACHED FOR DETAILED DIRECTIONS    . enzalutamide (XTANDI) 40 MG capsule Take by mouth.    . Hypromellose (ARTIFICIAL TEARS OP) Place 1 drop into the left eye daily.    . Hypromellose 0.3 % SOLN Apply to eye.    . ILEVRO 0.3 % ophthalmic suspension Place 1 drop into the right eye daily.   2  . lidocaine (LIDODERM) 5 % Place 1 patch onto the skin daily. Remove & Discard patch within 12 hours or as directed by MD 30 patch 0  . megestrol (MEGACE) 400 MG/10ML suspension Take 10 mLs (400 mg total) by mouth 2 (two) times daily. 240 mL 0  . pravastatin (PRAVACHOL) 20 MG tablet TAKE 1 TABLET BY MOUTH EVERY DAY IN THE EVENING (Patient taking differently: Take 20 mg by mouth daily. ) 90 tablet 3  . prochlorperazine (COMPAZINE) 10 MG tablet Take 1 tablet (10 mg total) by mouth every 6 (six) hours as needed for nausea or vomiting. 30 tablet 0  . senna-docusate (SENNA S) 8.6-50 MG tablet Take 1 tablet by mouth 2 (two) times daily. 60 tablet 3   No current facility-administered medications for this visit.   Facility-Administered Medications Ordered in Other Visits  Medication Dose Route Frequency Provider Last  Rate Last Admin  . sodium chloride flush (NS) 0.9 % injection 10 mL  10 mL Intravenous PRN Wyatt Portela, MD   10 mL at 12/28/19 1344     Allergies:  Allergies  Allergen Reactions  . Aspirin Nausea Only    Patient stated,"I get nauseated with higher doses of Aspirin." Patient stated,"I get nauseated with higher doses of Aspirin." Patient stated,"I get nauseated with higher doses of Aspirin."      Physical Exam:    Blood pressure (!) 157/90, pulse 95, temperature 97.7 F (36.5 C), temperature source Temporal, resp. rate 18, height 5\' 10"  (1.778 m),  weight 223 lb 9.6 oz (101.4 kg), SpO2 100 %.        ECOG: 1     General appearance: Alert, awake without any distress. Head: Atraumatic without abnormalities Oropharynx: Without any thrush or ulcers. Eyes: No scleral icterus. Lymph nodes: No lymphadenopathy noted in the cervical, supraclavicular, or axillary nodes Heart:regular rate and rhythm, without any murmurs or gallops.   Lung: Clear to auscultation without any rhonchi, wheezes or dullness to percussion. Abdomin: Soft, nontender without any shifting dullness or ascites. Musculoskeletal: No clubbing or cyanosis. Neurological: No motor or sensory deficits. Skin: No rashes or lesions.                    Lab Results: Lab Results  Component Value Date   WBC 5.8 12/07/2019   HGB 10.1 (L) 12/07/2019   HCT 31.9 (L) 12/07/2019   MCV 93.0 12/07/2019   PLT 185 12/07/2019     Chemistry      Component Value Date/Time   NA 142 12/07/2019 1135   NA 141 08/12/2017 1258   K 4.0 12/07/2019 1135   K 3.4 (L) 08/12/2017 1258   CL 111 12/07/2019 1135   CO2 22 12/07/2019 1135   CO2 27 08/12/2017 1258   BUN 12 12/07/2019 1135   BUN 14.9 08/12/2017 1258   CREATININE 0.80 12/07/2019 1135   CREATININE 1.0 08/12/2017 1258      Component Value Date/Time   CALCIUM 9.0 12/07/2019 1135   CALCIUM 9.5 08/12/2017 1258   ALKPHOS 190 (H) 12/07/2019 1135   ALKPHOS 62 08/12/2017 1258   AST 22 12/07/2019 1135   AST 36 (H) 08/12/2017 1258   ALT 19 12/07/2019 1135   ALT 43 08/12/2017 1258   BILITOT 0.3 12/07/2019 1135   BILITOT 0.34 08/12/2017 1258        Results for LESEAN, KENIMER I (MRN [id number]) as of 12/28/2019 13:47  Ref. Range 10/05/2019 11:42 10/26/2019 08:15 11/16/2019 11:35 12/07/2019 11:35  Prostate Specific Ag, Serum Latest Ref Range: 0.0 - 4.0 ng/mL 127.0 (H) 67.4 (H) 63.2 (H) 45.9 (H)       Impression and Plan:   84 year old man with:  1.  Advanced prostate cancer with disease to the bone  diagnosed in 2018.  He has castration-resistant disease since that time.   He has tolerated Taxotere chemotherapy with excellent PSA response at this time.  His PSA has declined more than 50% after 6 cycles of therapy.  Risks and benefits of continuing chemotherapy were reviewed today.  Potential complications include nausea, vomiting, myelosuppression and neuropathy.  The goal of 10 cycles would be reasonable if he continues to tolerate treatment.  He is agreeable to continue at this time.   2. Androgen deprivation therapy: He is currently receiving that under the care of [facility] and I recommended continued indefinitely.  3. Bone directed therapy: I  recommended continuing calcium and vitamin D supplements.  He is currently on Fosamax.   4.  Prognosis and goals of care: His disease is incurable although aggressive measures are warranted with his excellent performance status.  5.  Anorexia: Resolved with weight stable and slightly increased with improved appetite.  His anorexia was related to cancer progression improvement reflects improvement in his disease status.  6.  IV access: Port-A-Cath remains in place and use without any issues.  7.  Growth factor support: He is at risk of developing neutropenia and sepsis and will receive growth factor support after each cycle.  8. Follow-up: He will return in 3 weeks for repeat evaluation.   30  minutes were spent on this encounter.  The time was dedicated to reviewing his disease status, reviewing laboratory data, addressing complications related to therapy and future plan of care.    Zola Button, MD 5/20/20211:46 PM

## 2019-12-28 NOTE — Patient Instructions (Signed)

## 2019-12-29 ENCOUNTER — Telehealth: Payer: Self-pay

## 2019-12-29 ENCOUNTER — Telehealth: Payer: Self-pay | Admitting: Oncology

## 2019-12-29 LAB — PROSTATE-SPECIFIC AG, SERUM (LABCORP): Prostate Specific Ag, Serum: 39 ng/mL — ABNORMAL HIGH (ref 0.0–4.0)

## 2019-12-29 NOTE — Telephone Encounter (Signed)
Called patient and left a message with PSA result.

## 2019-12-29 NOTE — Telephone Encounter (Signed)
-----   Message from Wyatt Portela, MD sent at 12/29/2019  8:10 AM EDT ----- Please let him know his PSA is down

## 2019-12-29 NOTE — Telephone Encounter (Signed)
Scheduled appt per 5/20 los.  

## 2019-12-30 ENCOUNTER — Other Ambulatory Visit: Payer: Self-pay

## 2019-12-30 ENCOUNTER — Inpatient Hospital Stay: Payer: Medicare Other

## 2019-12-30 VITALS — BP 135/82 | HR 96 | Temp 98.7°F | Resp 18

## 2019-12-30 DIAGNOSIS — C61 Malignant neoplasm of prostate: Secondary | ICD-10-CM

## 2019-12-30 DIAGNOSIS — Z5111 Encounter for antineoplastic chemotherapy: Secondary | ICD-10-CM | POA: Diagnosis not present

## 2019-12-30 DIAGNOSIS — Z5189 Encounter for other specified aftercare: Secondary | ICD-10-CM | POA: Diagnosis not present

## 2019-12-30 DIAGNOSIS — C7951 Secondary malignant neoplasm of bone: Secondary | ICD-10-CM | POA: Diagnosis not present

## 2019-12-30 MED ORDER — PEGFILGRASTIM INJECTION 6 MG/0.6ML ~~LOC~~
6.0000 mg | PREFILLED_SYRINGE | Freq: Once | SUBCUTANEOUS | Status: AC
  Administered 2019-12-30: 6 mg via SUBCUTANEOUS

## 2019-12-30 NOTE — Patient Instructions (Signed)

## 2020-01-04 NOTE — Addendum Note (Signed)
Addended by: Hulan Fray on: 01/04/2020 03:29 PM   Modules accepted: Orders

## 2020-01-12 NOTE — Progress Notes (Signed)
Pharmacist Chemotherapy Monitoring - Follow Up Assessment    I verify that I have reviewed each item in the below checklist:  . Regimen for the patient is scheduled for the appropriate day and plan matches scheduled date. Marland Kitchen Appropriate non-routine labs are ordered dependent on drug ordered. . If applicable, additional medications reviewed and ordered per protocol based on lifetime cumulative doses and/or treatment regimen.   Plan for follow-up and/or issues identified: No . I-vent associated with next due treatment: No . MD and/or nursing notified: No  [name] K 01/12/2020 10:31 AM

## 2020-01-18 ENCOUNTER — Other Ambulatory Visit: Payer: Self-pay

## 2020-01-18 ENCOUNTER — Inpatient Hospital Stay: Payer: Medicare Other | Attending: Oncology

## 2020-01-18 ENCOUNTER — Inpatient Hospital Stay (HOSPITAL_BASED_OUTPATIENT_CLINIC_OR_DEPARTMENT_OTHER): Payer: Medicare Other | Admitting: Oncology

## 2020-01-18 ENCOUNTER — Inpatient Hospital Stay: Payer: Medicare Other

## 2020-01-18 VITALS — BP 145/78 | HR 83 | Temp 97.3°F | Resp 19 | Ht 70.0 in | Wt 222.0 lb

## 2020-01-18 DIAGNOSIS — C7951 Secondary malignant neoplasm of bone: Secondary | ICD-10-CM | POA: Diagnosis not present

## 2020-01-18 DIAGNOSIS — C61 Malignant neoplasm of prostate: Secondary | ICD-10-CM | POA: Diagnosis present

## 2020-01-18 DIAGNOSIS — Z5189 Encounter for other specified aftercare: Secondary | ICD-10-CM | POA: Diagnosis not present

## 2020-01-18 DIAGNOSIS — Z5111 Encounter for antineoplastic chemotherapy: Secondary | ICD-10-CM | POA: Insufficient documentation

## 2020-01-18 DIAGNOSIS — Z95828 Presence of other vascular implants and grafts: Secondary | ICD-10-CM

## 2020-01-18 LAB — CBC WITH DIFFERENTIAL (CANCER CENTER ONLY)
Abs Immature Granulocytes: 0.1 10*3/uL — ABNORMAL HIGH (ref 0.00–0.07)
Basophils Absolute: 0 10*3/uL (ref 0.0–0.1)
Basophils Relative: 0 %
Eosinophils Absolute: 0 10*3/uL (ref 0.0–0.5)
Eosinophils Relative: 0 %
HCT: 31 % — ABNORMAL LOW (ref 39.0–52.0)
Hemoglobin: 9.8 g/dL — ABNORMAL LOW (ref 13.0–17.0)
Immature Granulocytes: 2 %
Lymphocytes Relative: 36 %
Lymphs Abs: 2.1 10*3/uL (ref 0.7–4.0)
MCH: 30.3 pg (ref 26.0–34.0)
MCHC: 31.6 g/dL (ref 30.0–36.0)
MCV: 96 fL (ref 80.0–100.0)
Monocytes Absolute: 0.8 10*3/uL (ref 0.1–1.0)
Monocytes Relative: 13 %
Neutro Abs: 2.9 10*3/uL (ref 1.7–7.7)
Neutrophils Relative %: 49 %
Platelet Count: 142 10*3/uL — ABNORMAL LOW (ref 150–400)
RBC: 3.23 MIL/uL — ABNORMAL LOW (ref 4.22–5.81)
RDW: 16.8 % — ABNORMAL HIGH (ref 11.5–15.5)
WBC Count: 5.8 10*3/uL (ref 4.0–10.5)
nRBC: 0.3 % — ABNORMAL HIGH (ref 0.0–0.2)

## 2020-01-18 LAB — CMP (CANCER CENTER ONLY)
ALT: 17 U/L (ref 0–44)
AST: 19 U/L (ref 15–41)
Albumin: 3.4 g/dL — ABNORMAL LOW (ref 3.5–5.0)
Alkaline Phosphatase: 135 U/L — ABNORMAL HIGH (ref 38–126)
Anion gap: 8 (ref 5–15)
BUN: 12 mg/dL (ref 8–23)
CO2: 20 mmol/L — ABNORMAL LOW (ref 22–32)
Calcium: 8.8 mg/dL — ABNORMAL LOW (ref 8.9–10.3)
Chloride: 110 mmol/L (ref 98–111)
Creatinine: 0.85 mg/dL (ref 0.61–1.24)
GFR, Est AFR Am: >60 mL/min (ref >60)
GFR, Estimated: >60 mL/min (ref >60)
Glucose, Bld: 86 mg/dL (ref 70–99)
Potassium: 4 mmol/L (ref 3.5–5.1)
Sodium: 138 mmol/L (ref 135–145)
Total Bilirubin: 0.4 mg/dL (ref 0.3–1.2)
Total Protein: 6.9 g/dL (ref 6.5–8.1)

## 2020-01-18 MED ORDER — SODIUM CHLORIDE 0.9% FLUSH
10.0000 mL | Freq: Once | INTRAVENOUS | Status: AC
  Administered 2020-01-18: 10 mL
  Filled 2020-01-18: qty 10

## 2020-01-18 MED ORDER — SODIUM CHLORIDE 0.9 % IV SOLN
60.0000 mg/m2 | Freq: Once | INTRAVENOUS | Status: AC
  Administered 2020-01-18: 130 mg via INTRAVENOUS
  Filled 2020-01-18: qty 13

## 2020-01-18 MED ORDER — SODIUM CHLORIDE 0.9 % IV SOLN
10.0000 mg | Freq: Once | INTRAVENOUS | Status: AC
  Administered 2020-01-18: 10 mg via INTRAVENOUS
  Filled 2020-01-18: qty 10

## 2020-01-18 MED ORDER — HEPARIN SOD (PORK) LOCK FLUSH 100 UNIT/ML IV SOLN
500.0000 [IU] | Freq: Once | INTRAVENOUS | Status: AC | PRN
  Administered 2020-01-18: 500 [IU]
  Filled 2020-01-18: qty 5

## 2020-01-18 MED ORDER — SODIUM CHLORIDE 0.9 % IV SOLN
Freq: Once | INTRAVENOUS | Status: AC
  Filled 2020-01-18: qty 250

## 2020-01-18 MED ORDER — SODIUM CHLORIDE 0.9% FLUSH
10.0000 mL | INTRAVENOUS | Status: DC | PRN
  Administered 2020-01-18 (×2): 10 mL
  Filled 2020-01-18: qty 10

## 2020-01-18 NOTE — Progress Notes (Signed)
Hematology and Oncology Follow Up Visit  [name] [id number] [birth year] 84 y.o. 01/18/2020 12:48 PM [name], MDVincent, [name],*   Principle Diagnosis: 84 year old man with advanced prostate cancer with disease to the bone diagnosed in 2018. He has castration-resistant after initially diagnosed in 2008 with localized disease, Gleason score 4+4 = 8.   Prior Therapy: He was treated with androgen deprivation as well as definitive radiation therapy. His PSA nadir was 0.5 in 2009.   He developed advanced disease with PSA in February 2015 was up to 36.35 and a fluoride PET scan showed measurable disease in the second rib as well as the 10th thoracic vertebral body. He was started on androgen deprivation since that time. He had an excellent PSA response initially with a nadir down to 0.08 in April 2016.   His PSA was 0.8 in February 2017 and Casodex was added.   In April 2017 his PSA was 0.28 and started to rise again in July 2017. His PSA was 0.44 and subsequently was 1.09 in October 2017. On 10/09/2016 his PSA was 3.39. His testosterone level was 30.4. Casodex was discontinued in April 2018. His PSA was 7.7 in August 2018.  Xtandi 160 mg daily started on 04/08/2017.  Therapy discontinued in November 2020 for progression of disease.   Current therapy:  Taxotere chemotherapy 75 mg per metered square started  on July 13, 2019.  He is currently receiving 60 mg/m every 3 weeks and here for cycle 8 of therapy.    [facility]rogen deprivation received with [facility].  Interim History: [name] returns today for a follow-up visit. Since the last visit, he reports no major changes in his health.  He denies any nausea, vomiting or abdominal pain.  He denies any recent hospitalizations or illnesses.  He denies any complications related to chemotherapy including worsening neuropathy, fatigue or bone pain.  His performance status and quality of life has improved on this  treatment.   Medications: Unchanged on review. Current Outpatient Medications  Medication Sig Dispense Refill  . alendronate (FOSAMAX) 70 MG tablet Take 70 mg by mouth once a week.     Marland Kitchen amLODipine (NORVASC) 10 MG tablet TAKE 1 TABLET (10 MG TOTAL) DAILY BY MOUTH. 90 tablet 3  . [facility] MAGNESIUM CITRATE 1.745 GM/30ML SOLN PLEASE SEE ATTACHED FOR DETAILED DIRECTIONS    . enzalutamide (XTANDI) 40 MG capsule Take by mouth.    . Hypromellose (ARTIFICIAL TEARS OP) Place 1 drop into the left eye daily.    . Hypromellose 0.3 % SOLN Apply to eye.    . ILEVRO 0.3 % ophthalmic suspension Place 1 drop into the right eye daily.   2  . lidocaine (LIDODERM) 5 % Place 1 patch onto the skin daily. Remove & Discard patch within 12 hours or as directed by MD 30 patch 0  . megestrol (MEGACE) 400 MG/10ML suspension Take 10 mLs (400 mg total) by mouth 2 (two) times daily. 240 mL 0  . pravastatin (PRAVACHOL) 20 MG tablet TAKE 1 TABLET BY MOUTH EVERY DAY IN THE EVENING (Patient taking differently: Take 20 mg by mouth daily. ) 90 tablet 3  . prochlorperazine (COMPAZINE) 10 MG tablet Take 1 tablet (10 mg total) by mouth every 6 (six) hours as needed for nausea or vomiting. 30 tablet 0  . senna-docusate (SENNA S) 8.6-50 MG tablet Take 1 tablet by mouth 2 (two) times daily. 60 tablet 3   No current facility-administered medications for this visit.  Allergies:  Allergies  Allergen Reactions  . Aspirin Nausea Only    Patient stated,"[name] get nauseated with higher doses of Aspirin." Patient stated,"[name] get nauseated with higher doses of Aspirin." Patient stated,"[name] get nauseated with higher doses of Aspirin."      Physical Exam:       Blood pressure (!) 145/78, pulse 83, temperature (!) 97.3 F (36.3 C), temperature source Temporal, resp. rate 19, height 5\' 10"  (1.778 m), weight 222 lb (100.7 kg), SpO2 100 %.      ECOG: 1    General appearance: Comfortable appearing without any discomfort Head:  Normocephalic without any trauma Oropharynx: Mucous membranes are moist and pink without any thrush or ulcers. Eyes: Pupils are equal and round reactive to light. Lymph nodes: No cervical, supraclavicular, inguinal or axillary lymphadenopathy.   Heart:regular rate and rhythm.  S1 and S2 without leg edema. Lung: Clear without any rhonchi or wheezes.  No dullness to percussion. Abdomin: Soft, nontender, nondistended with good bowel sounds.  No hepatosplenomegaly. Musculoskeletal: No joint deformity or effusion.  Full range of motion noted. Neurological: No deficits noted on motor, sensory and deep tendon reflex exam. Skin: No petechial rash or dryness.  Appeared moist.                       Lab Results: Lab Results  Component Value Date   WBC 6.7 12/28/2019   HGB 9.8 (L) 12/28/2019   HCT 31.1 (L) 12/28/2019   MCV 94.8 12/28/2019   PLT 134 (L) 12/28/2019     Chemistry      Component Value Date/Time   NA 137 12/28/2019 1337   NA 141 08/12/2017 1258   K 3.7 12/28/2019 1337   K 3.4 (L) 08/12/2017 1258   CL 109 12/28/2019 1337   CO2 22 12/28/2019 1337   CO2 27 08/12/2017 1258   BUN 8 12/28/2019 1337   BUN 14.9 08/12/2017 1258   CREATININE 0.84 12/28/2019 1337   CREATININE 1.0 08/12/2017 1258      Component Value Date/Time   CALCIUM 8.4 (L) 12/28/2019 1337   CALCIUM 9.5 08/12/2017 1258   ALKPHOS 152 (H) 12/28/2019 1337   ALKPHOS 62 08/12/2017 1258   AST 21 12/28/2019 1337   AST 36 (H) 08/12/2017 1258   ALT 19 12/28/2019 1337   ALT 43 08/12/2017 1258   BILITOT 0.4 12/28/2019 1337   BILITOT 0.34 08/12/2017 1258         Results for [name] (MRN [id number]) as of 01/18/2020 12:43  Ref. Range 10/26/2019 08:15 11/16/2019 11:35 12/07/2019 11:35 12/28/2019 13:37  Prostate Specific Ag, Serum Latest Ref Range: 0.0 - 4.0 ng/mL 67.4 (H) 63.2 (H) 45.9 (H) 39.0 (H)       Impression and Plan:   84 year old man with:  1.  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   He continues to tolerate Taxotere chemotherapy without any major complications and excellent PSA response. His PSA and May 2021 was 45 compared to 127 in February. Risks and benefits of continuing this treatment long-term were reviewed. Potential complications that include nausea, vomiting, myelosuppression, neutropenia, sepsis and neuropathy.  He is agreeable to continue and the plan is to complete 10 cycles tentatively.   2. Androgen deprivation therapy: [name] recommended continuing this indefinitely. He is receiving it under care of [facility].   3. Bone directed therapy: He is currently on Fosamax [name] recommended continuing calcium and vitamin D supplements.  4.  Prognosis and goals of care: Therapy remains palliative although aggressive measures are warranted given his reasonable performance status.  5.  Anorexia: Related to malignancy and chemotherapy and has improved since last visit.  6.  IV access: Port-A-Cath currently in use without any issues.  7.  Growth factor support: This will be given after each cycle of therapy given his risk of neutropenia and sepsis.  8. Follow-up: In 3 weeks for follow-up evaluation.   30  minutes were dedicated to this visit. The time was spent on reviewing laboratory data, discussing treatment options and addressing complications related to therapy.    Zola Button, MD 6/10/202112:48 PM

## 2020-01-18 NOTE — Patient Instructions (Signed)
[facility] Discharge Instructions for Patients Receiving Chemotherapy  Today you received the following chemotherapy agents:  Taxotere.  To help prevent nausea and vomiting after your treatment, we encourage you to take your nausea medication as directed.   If you develop nausea and vomiting that is not controlled by your nausea medication, call the clinic.   BELOW ARE SYMPTOMS THAT SHOULD BE REPORTED IMMEDIATELY:  *FEVER GREATER THAN 100.5 F  *CHILLS WITH OR WITHOUT FEVER  NAUSEA AND VOMITING THAT IS NOT CONTROLLED WITH YOUR NAUSEA MEDICATION  *UNUSUAL SHORTNESS OF BREATH  *UNUSUAL BRUISING OR BLEEDING  TENDERNESS IN MOUTH AND THROAT WITH OR WITHOUT PRESENCE OF ULCERS  *URINARY PROBLEMS  *BOWEL PROBLEMS  UNUSUAL RASH Items with * indicate a potential emergency and should be followed up as soon as possible.  Feel free to call the clinic you have any questions or concerns. The clinic phone number is [phone].  Please show the CHEMO ALERT CARD at check-in to the Emergency Department and triage nurse.  

## 2020-01-19 ENCOUNTER — Telehealth: Payer: Self-pay

## 2020-01-19 ENCOUNTER — Telehealth: Payer: Self-pay | Admitting: Oncology

## 2020-01-19 LAB — PROSTATE-SPECIFIC AG, SERUM (LABCORP): Prostate Specific Ag, Serum: 33.1 ng/mL — ABNORMAL HIGH (ref 0.0–4.0)

## 2020-01-19 NOTE — Telephone Encounter (Signed)
Called and left voicemail for patient to return call to Dr. Hazeline Junker nurse at [phone] for lab result.

## 2020-01-19 NOTE — Telephone Encounter (Signed)
Scheduled appt per 6/10 los. °

## 2020-01-19 NOTE — Telephone Encounter (Signed)
-----   Message from Wyatt Portela, MD sent at 01/19/2020  8:49 AM EDT ----- Please let him know his PSA is down

## 2020-01-20 ENCOUNTER — Inpatient Hospital Stay: Payer: Medicare Other

## 2020-01-20 ENCOUNTER — Other Ambulatory Visit: Payer: Self-pay

## 2020-01-20 VITALS — BP 143/74 | HR 95 | Temp 98.4°F | Resp 18

## 2020-01-20 DIAGNOSIS — Z5189 Encounter for other specified aftercare: Secondary | ICD-10-CM | POA: Diagnosis not present

## 2020-01-20 DIAGNOSIS — Z5111 Encounter for antineoplastic chemotherapy: Secondary | ICD-10-CM | POA: Diagnosis not present

## 2020-01-20 DIAGNOSIS — C7951 Secondary malignant neoplasm of bone: Secondary | ICD-10-CM | POA: Diagnosis not present

## 2020-01-20 MED ORDER — PEGFILGRASTIM INJECTION 6 MG/0.6ML ~~LOC~~
6.0000 mg | PREFILLED_SYRINGE | Freq: Once | SUBCUTANEOUS | Status: AC
  Administered 2020-01-20: 6 mg via SUBCUTANEOUS

## 2020-01-20 NOTE — Patient Instructions (Signed)

## 2020-01-29 ENCOUNTER — Ambulatory Visit (INDEPENDENT_AMBULATORY_CARE_PROVIDER_SITE_OTHER): Payer: Medicare Other | Admitting: Student in an Organized Health Care Education/Training Program

## 2020-01-29 ENCOUNTER — Encounter: Payer: Self-pay | Admitting: Student in an Organized Health Care Education/Training Program

## 2020-01-29 VITALS — BP 139/69 | Temp 98.3°F | Ht 70.0 in | Wt 226.4 lb

## 2020-01-29 DIAGNOSIS — E1169 Type 2 diabetes mellitus with other specified complication: Secondary | ICD-10-CM | POA: Diagnosis not present

## 2020-01-29 DIAGNOSIS — J302 Other seasonal allergic rhinitis: Secondary | ICD-10-CM

## 2020-01-29 DIAGNOSIS — H903 Sensorineural hearing loss, bilateral: Secondary | ICD-10-CM

## 2020-01-29 DIAGNOSIS — I10 Essential (primary) hypertension: Secondary | ICD-10-CM | POA: Diagnosis not present

## 2020-01-29 LAB — POCT GLYCOSYLATED HEMOGLOBIN (HGB A1C): Hemoglobin A1C: 5 % (ref 4.0–5.6)

## 2020-01-29 LAB — GLUCOSE, CAPILLARY: Glucose-Capillary: 86 mg/dL (ref 70–99)

## 2020-01-29 MED ORDER — CETIRIZINE HCL 10 MG PO TABS: 10.0000 mg | ORAL_TABLET | Freq: Every day | ORAL | 2 refills | Status: AC

## 2020-01-29 MED ORDER — FLUTICASONE PROPIONATE 50 MCG/ACT NA SUSP: 1.0000 | Freq: Every day | NASAL | 2 refills | Status: AC

## 2020-01-29 NOTE — Patient Instructions (Signed)
Is great seeing you today in clinic.  For your allergies, I prescribed cetirizine 10 mg to take once daily and Flonase intranasal spray to be used once daily as well.  For your leg swelling, use compression stockings during the day, remove them at night.  For your hearing loss, I have referred you to [facility] where they can have you fitted for hearing aids.

## 2020-01-29 NOTE — Assessment & Plan Note (Signed)
Progressive bilateral chronic sensorineural hearing loss, worse on the right side.  Using generic hearing aids right now, will send to ENT for audiometry and custom hearing aids.

## 2020-01-29 NOTE — Assessment & Plan Note (Signed)
Blood pressure well controlled today.  Plan to continue with amlodipine 10 mg daily.  Still likely does not need to restart losartan.

## 2020-01-29 NOTE — Progress Notes (Signed)
   Assessment and Plan:  See Encounters tab for problem-based medical decision making.   __________________________________________________________  HPI:   84 year old person here for follow-up of hypertension and diabetes.  Doing well at home, good functional status, accompanied by his wife.  Going through chemotherapy right now for stage IV prostate cancer.  Doing better than when I saw him in January.  Weight appears to be back up.  Hypotension is resolved.  Good appetite with very little side effects.  Having some numbness and tingling in bilateral fingertips.  Acute complaints today include rhinorrhea, clear, no fevers, postnasal drip cough.  Stable dyspnea with exertion, no orthopnea.  Complains of lower extremity edema, mild, no skin breakdown, same on both sides.  Complains of progressive difficulties with hearing, especially out of the right ear.  Has used hearing aids from the drugstore which are somewhat helpful.  We have referred to ENT in the past for hearing aids, but has been unable to complete these appointments.  __________________________________________________________  Problem List: Patient Active Problem List   Diagnosis Date Noted  . Advanced directives, counseling/discussion 08/21/2019    Priority: High  . Prostate cancer metastatic to bone ([place]) 04/03/2009    Priority: High  . Essential hypertension 07/09/2006    Priority: High  . Urinary incontinence 01/16/2019    Priority: Medium  . Type 2 diabetes mellitus with other specified complication ([place]) 03/83/3383    Priority: Medium  . Hallux valgus of right foot 01/16/2019    Priority: Low  . Sensorineural hearing loss (SNHL) of both ears 06/28/2017    Priority: Low  . Hearing loss 11/30/2016    Priority: Low  . Hyperlipidemia 05/20/2015    Priority: Low  . Obstructive sleep apnea 07/06/2007    Priority: Low  . Allergic rhinitis 07/09/2006    Priority: Low  . Osteoarthritis of left knee 07/09/2006     Priority: Low  . Port-A-Cath in place 01/18/2020  . Prostate cancer ([place]) 07/04/2019    Medications: Reconciled today in Epic __________________________________________________________  Physical Exam:  Vital Signs: Vitals:   01/29/20 1035  BP: 139/69  Temp: 98.3 F (36.8 C)  TempSrc: Oral  SpO2: 100%  Weight: 226 lb 6.4 oz (102.7 kg)  Height: 5\' 10"  (1.778 m)    Gen: Well appearing, NAD ENT: Bilateral ear canals are clear of wax, tympanic membranes appear normal with no effusions CV: RRR, no murmurs Pulm: Mild tachypnea, CTA throughout, no wheezing Ext: Warm, 1+ pitting edema bilaterally in lower extremities, normal joints

## 2020-01-29 NOTE — Assessment & Plan Note (Signed)
Has been off Metformin for several months now.  Plan to check an A1c today.  Likely can continue to treat with lifestyle modifications only.

## 2020-01-29 NOTE — Assessment & Plan Note (Signed)
Symptoms of rhinitis seem to be most consistent with allergic etiology.  No signs of bacterial sinusitis.  Plan to treat with cetirizine 10 mg daily and fluticasone nasal spray daily.

## 2020-02-05 ENCOUNTER — Telehealth: Payer: Self-pay

## 2020-02-05 NOTE — Telephone Encounter (Signed)
Received a voicemail from patient's daughter T. Snead requesting a return phone call. Returned call to [phone]. No answer. Left voicemail that a call was being returned and to return call to [phone].

## 2020-02-06 DIAGNOSIS — N320 Bladder-neck obstruction: Secondary | ICD-10-CM | POA: Diagnosis not present

## 2020-02-06 DIAGNOSIS — N35012 Post-traumatic membranous urethral stricture: Secondary | ICD-10-CM | POA: Diagnosis not present

## 2020-02-06 DIAGNOSIS — R350 Frequency of micturition: Secondary | ICD-10-CM | POA: Diagnosis not present

## 2020-02-06 DIAGNOSIS — N138 Other obstructive and reflux uropathy: Secondary | ICD-10-CM | POA: Diagnosis not present

## 2020-02-06 DIAGNOSIS — R8279 Other abnormal findings on microbiological examination of urine: Secondary | ICD-10-CM | POA: Diagnosis not present

## 2020-02-08 ENCOUNTER — Inpatient Hospital Stay: Payer: Medicare Other | Attending: Oncology

## 2020-02-08 ENCOUNTER — Inpatient Hospital Stay: Payer: Medicare Other

## 2020-02-08 ENCOUNTER — Inpatient Hospital Stay (HOSPITAL_BASED_OUTPATIENT_CLINIC_OR_DEPARTMENT_OTHER): Payer: Medicare Other | Admitting: Oncology

## 2020-02-08 ENCOUNTER — Other Ambulatory Visit: Payer: Self-pay

## 2020-02-08 VITALS — BP 138/69 | HR 75 | Temp 97.9°F | Resp 18 | Ht 70.0 in | Wt 226.5 lb

## 2020-02-08 DIAGNOSIS — C7951 Secondary malignant neoplasm of bone: Secondary | ICD-10-CM | POA: Diagnosis not present

## 2020-02-08 DIAGNOSIS — C61 Malignant neoplasm of prostate: Secondary | ICD-10-CM | POA: Insufficient documentation

## 2020-02-08 DIAGNOSIS — Z5111 Encounter for antineoplastic chemotherapy: Secondary | ICD-10-CM | POA: Insufficient documentation

## 2020-02-08 DIAGNOSIS — Z95828 Presence of other vascular implants and grafts: Secondary | ICD-10-CM

## 2020-02-08 DIAGNOSIS — Z5189 Encounter for other specified aftercare: Secondary | ICD-10-CM | POA: Diagnosis not present

## 2020-02-08 LAB — CMP (CANCER CENTER ONLY)
ALT: 18 U/L (ref 0–44)
AST: 13 U/L — ABNORMAL LOW (ref 15–41)
Albumin: 3.4 g/dL — ABNORMAL LOW (ref 3.5–5.0)
Alkaline Phosphatase: 89 U/L (ref 38–126)
Anion gap: 8 (ref 5–15)
BUN: 14 mg/dL (ref 8–23)
CO2: 18 mmol/L — ABNORMAL LOW (ref 22–32)
Calcium: 8.5 mg/dL — ABNORMAL LOW (ref 8.9–10.3)
Chloride: 114 mmol/L — ABNORMAL HIGH (ref 98–111)
Creatinine: 0.9 mg/dL (ref 0.61–1.24)
GFR, Est AFR Am: >60 mL/min (ref >60)
GFR, Estimated: >60 mL/min (ref >60)
Glucose, Bld: 108 mg/dL — ABNORMAL HIGH (ref 70–99)
Potassium: 4.2 mmol/L (ref 3.5–5.1)
Sodium: 140 mmol/L (ref 135–145)
Total Bilirubin: 0.3 mg/dL (ref 0.3–1.2)
Total Protein: 6.8 g/dL (ref 6.5–8.1)

## 2020-02-08 LAB — CBC WITH DIFFERENTIAL (CANCER CENTER ONLY)
Abs Immature Granulocytes: 0.04 10*3/uL (ref 0.00–0.07)
Basophils Absolute: 0 10*3/uL (ref 0.0–0.1)
Basophils Relative: 0 %
Eosinophils Absolute: 0 10*3/uL (ref 0.0–0.5)
Eosinophils Relative: 0 %
HCT: 31.4 % — ABNORMAL LOW (ref 39.0–52.0)
Hemoglobin: 9.9 g/dL — ABNORMAL LOW (ref 13.0–17.0)
Immature Granulocytes: 1 %
Lymphocytes Relative: 30 %
Lymphs Abs: 2 10*3/uL (ref 0.7–4.0)
MCH: 31 pg (ref 26.0–34.0)
MCHC: 31.5 g/dL (ref 30.0–36.0)
MCV: 98.4 fL (ref 80.0–100.0)
Monocytes Absolute: 0.9 10*3/uL (ref 0.1–1.0)
Monocytes Relative: 14 %
Neutro Abs: 3.6 10*3/uL (ref 1.7–7.7)
Neutrophils Relative %: 55 %
Platelet Count: 165 10*3/uL (ref 150–400)
RBC: 3.19 MIL/uL — ABNORMAL LOW (ref 4.22–5.81)
RDW: 19.2 % — ABNORMAL HIGH (ref 11.5–15.5)
WBC Count: 6.5 10*3/uL (ref 4.0–10.5)
nRBC: 0 % (ref 0.0–0.2)

## 2020-02-08 MED ORDER — HEPARIN SOD (PORK) LOCK FLUSH 100 UNIT/ML IV SOLN
500.0000 [IU] | Freq: Once | INTRAVENOUS | Status: AC | PRN
  Administered 2020-02-08: 500 [IU]
  Filled 2020-02-08: qty 5

## 2020-02-08 MED ORDER — SODIUM CHLORIDE 0.9 % IV SOLN
60.0000 mg/m2 | Freq: Once | INTRAVENOUS | Status: AC
  Administered 2020-02-08: 130 mg via INTRAVENOUS
  Filled 2020-02-08: qty 13

## 2020-02-08 MED ORDER — SODIUM CHLORIDE 0.9% FLUSH
10.0000 mL | Freq: Once | INTRAVENOUS | Status: AC
  Administered 2020-02-08: 10 mL
  Filled 2020-02-08: qty 10

## 2020-02-08 MED ORDER — SODIUM CHLORIDE 0.9% FLUSH
10.0000 mL | INTRAVENOUS | Status: DC | PRN
  Administered 2020-02-08: 10 mL
  Filled 2020-02-08: qty 10

## 2020-02-08 MED ORDER — SODIUM CHLORIDE 0.9 % IV SOLN
Freq: Once | INTRAVENOUS | Status: AC
  Filled 2020-02-08: qty 250

## 2020-02-08 MED ORDER — SODIUM CHLORIDE 0.9 % IV SOLN
10.0000 mg | Freq: Once | INTRAVENOUS | Status: AC
  Administered 2020-02-08: 10 mg via INTRAVENOUS
  Filled 2020-02-08: qty 10

## 2020-02-08 NOTE — Progress Notes (Signed)
Hematology and Oncology Follow Up Visit  [name] [id number] 09-16-1935 84 y.o. 02/08/2020 10:34 AM [name], MDVincent, Mallie [name],*   Principle Diagnosis: 84 year old man with castration-resistant prostate cancer with disease to the bone since 2018.  He presented with Gleason score 4+4 = 8 localized disease in 2008.   Prior Therapy: He was treated with androgen deprivation as well as definitive radiation therapy. His PSA nadir was 0.5 in 2009.   He developed advanced disease with PSA in February 2015 was up to 36.35 and a fluoride PET scan showed measurable disease in the second rib as well as the 10th thoracic vertebral body. He was started on androgen deprivation since that time. He had an excellent PSA response initially with a nadir down to 0.08 in April 2016.   His PSA was 0.8 in February 2017 and Casodex was added.   In April 2017 his PSA was 0.28 and started to rise again in July 2017. His PSA was 0.44 and subsequently was 1.09 in October 2017. On 10/09/2016 his PSA was 3.39. His testosterone level was 30.4. Casodex was discontinued in April 2018. His PSA was 7.7 in August 2018.  Xtandi 160 mg daily started on 04/08/2017.  Therapy discontinued in November 2020 for progression of disease.   Current therapy:  Taxotere chemotherapy 75 mg per metered square started  on July 13, 2019.  He is currently receiving 60 mg/m every 3 weeks and here for cycle 9.    Androgen deprivation under the care of [facility].  Interim History: [name] presents today for repeat evaluation.  Since the last visit, he reports no major changes in his health.  He continues to tolerate chemotherapy without any complaints he denies any nausea, vomiting or abdominal pain.  He denies any excessive fatigue or tiredness.  He denies any worsening neuropathy.  He does report a mild ankle edema but otherwise continues to be active without any decline in ability to do  so.   Medications: Updated on review. Current Outpatient Medications  Medication Sig Dispense Refill  . alendronate (FOSAMAX) 70 MG tablet Take 70 mg by mouth once a week.     Marland Kitchen amLODipine (NORVASC) 10 MG tablet TAKE 1 TABLET (10 MG TOTAL) DAILY BY MOUTH. 90 tablet 3  . cetirizine (ZYRTEC ALLERGY) 10 MG tablet Take 1 tablet (10 mg total) by mouth daily. 30 tablet 2  . [facility] MAGNESIUM CITRATE 1.745 GM/30ML SOLN PLEASE SEE ATTACHED FOR DETAILED DIRECTIONS    . enzalutamide (XTANDI) 40 MG capsule Take by mouth.    . fluticasone (FLONASE) 50 MCG/ACT nasal spray Place 1 spray into both nostrils daily. 16 g 2  . Hypromellose (ARTIFICIAL TEARS OP) Place 1 drop into the left eye daily.    . Hypromellose 0.3 % SOLN Apply to eye.    . ILEVRO 0.3 % ophthalmic suspension Place 1 drop into the right eye daily.   2  . lidocaine (LIDODERM) 5 % Place 1 patch onto the skin daily. Remove & Discard patch within 12 hours or as directed by MD 30 patch 0  . megestrol (MEGACE) 400 MG/10ML suspension Take 10 mLs (400 mg total) by mouth 2 (two) times daily. 240 mL 0  . pravastatin (PRAVACHOL) 20 MG tablet TAKE 1 TABLET BY MOUTH EVERY DAY IN THE EVENING (Patient taking differently: Take 20 mg by mouth daily. ) 90 tablet 3  . prochlorperazine (COMPAZINE) 10 MG tablet Take 1 tablet (10 mg total) by mouth every 6 (six)  hours as needed for nausea or vomiting. 30 tablet 0  . senna-docusate (SENNA S) 8.6-50 MG tablet Take 1 tablet by mouth 2 (two) times daily. 60 tablet 3   No current facility-administered medications for this visit.     Allergies:  Allergies  Allergen Reactions  . Aspirin Nausea Only    Patient stated,"[name] get nauseated with higher doses of Aspirin." Patient stated,"[name] get nauseated with higher doses of Aspirin." Patient stated,"[name] get nauseated with higher doses of Aspirin."      Physical Exam:       Blood pressure 138/69, pulse 75, temperature 97.9 F (36.6 C), temperature source Temporal,  resp. rate 18, height 5\' 10"  (1.778 m), weight 226 lb 8 oz (102.7 kg), SpO2 100 %.      ECOG: 1   General appearance: Alert, awake without any distress. Head: Atraumatic without abnormalities Oropharynx: Without any thrush or ulcers. Eyes: No scleral icterus. Lymph nodes: No lymphadenopathy noted in the cervical, supraclavicular, or axillary nodes Heart:regular rate and rhythm, without any murmurs or gallops.    Slight ankle edema noted bilaterally. Lung: Clear to auscultation without any rhonchi, wheezes or dullness to percussion. Abdomin: Soft, nontender without any shifting dullness or ascites. Musculoskeletal: No clubbing or cyanosis. Neurological: No motor or sensory deficits. Skin: No rashes or lesions.                      Lab Results: Lab Results  Component Value Date   WBC 6.5 02/08/2020   HGB 9.9 (L) 02/08/2020   HCT 31.4 (L) 02/08/2020   MCV 98.4 02/08/2020   PLT 165 02/08/2020     Chemistry      Component Value Date/Time   NA 138 01/18/2020 1220   NA 141 08/12/2017 1258   K 4.0 01/18/2020 1220   K 3.4 (L) 08/12/2017 1258   CL 110 01/18/2020 1220   CO2 20 (L) 01/18/2020 1220   CO2 27 08/12/2017 1258   BUN 12 01/18/2020 1220   BUN 14.9 08/12/2017 1258   CREATININE 0.85 01/18/2020 1220   CREATININE 1.0 08/12/2017 1258      Component Value Date/Time   CALCIUM 8.8 (L) 01/18/2020 1220   CALCIUM 9.5 08/12/2017 1258   ALKPHOS 135 (H) 01/18/2020 1220   ALKPHOS 62 08/12/2017 1258   AST 19 01/18/2020 1220   AST 36 (H) 08/12/2017 1258   ALT 17 01/18/2020 1220   ALT 43 08/12/2017 1258   BILITOT 0.4 01/18/2020 1220   BILITOT 0.34 08/12/2017 1258        Results for [name] (MRN [id number]) as of 02/08/2020 10:28  Ref. Range 12/28/2019 13:37 01/18/2020 12:20  Prostate Specific Ag, Serum Latest Ref Range: 0.0 - 4.0 ng/mL 39.0 (H) 33.1 (H)         Impression and Plan:   84 year old man with:  1.  Advanced prostate cancer  with disease to the bone diagnosed in 2018.  Castration-resistant disease currently.   He has tolerated Taxotere at a lower dose which better with excellent PSA response.  His PSA continues to decline with improvement in his quality of life status.  Risks and benefits of continuing this treatment long-term were reviewed.  The duration of therapy would be at least 10 cycles depending on his tolerance after that.  He is agreeable to proceed at this time.   2. Androgen deprivation therapy: He is currently receiving that under the care of [facility] which [name] recommended continuing.   3.  Bone directed therapy: [name] recommended continuing calcium and vitamin D supplements in addition to Fosamax which she is taking.   4.  Prognosis and goals of care: His disease is incurable although aggressive measures are warranted given his performance status.  5.  Anorexia: Resolved at this time with weight improving currently.  6.  IV access: Port-A-Cath remains in use without any issues.  7.  Growth factor support: He is at risk of developing neutropenia and sepsis and will receive it after each cycle of therapy.  8. Follow-up: He will return in 3 weeks for the next cycle of therapy.   30  minutes were spent on this encounter.  The time was dedicated to reviewing his disease status, discussing treatment options for the future and addressing complications related to cancer and chemotherapy.    Zola Button, MD 7/1/202110:34 AM

## 2020-02-08 NOTE — Patient Instructions (Signed)
[facility] Discharge Instructions for Patients Receiving Chemotherapy  Today you received the following chemotherapy agents: Taxotere  To help prevent nausea and vomiting after your treatment, we encourage you to take your nausea medication as directed.    If you develop nausea and vomiting that is not controlled by your nausea medication, call the clinic.   BELOW ARE SYMPTOMS THAT SHOULD BE REPORTED IMMEDIATELY:  *FEVER GREATER THAN 100.5 F  *CHILLS WITH OR WITHOUT FEVER  NAUSEA AND VOMITING THAT IS NOT CONTROLLED WITH YOUR NAUSEA MEDICATION  *UNUSUAL SHORTNESS OF BREATH  *UNUSUAL BRUISING OR BLEEDING  TENDERNESS IN MOUTH AND THROAT WITH OR WITHOUT PRESENCE OF ULCERS  *URINARY PROBLEMS  *BOWEL PROBLEMS  UNUSUAL RASH Items with * indicate a potential emergency and should be followed up as soon as possible.  Feel free to call the clinic should you have any questions or concerns. The clinic phone number is [phone].  Please show the CHEMO ALERT CARD at check-in to the Emergency Department and triage nurse.   

## 2020-02-09 ENCOUNTER — Telehealth: Payer: Self-pay | Admitting: *Deleted

## 2020-02-09 LAB — PROSTATE-SPECIFIC AG, SERUM (LABCORP): Prostate Specific Ag, Serum: 48 ng/mL — ABNORMAL HIGH (ref 0.0–4.0)

## 2020-02-09 NOTE — Telephone Encounter (Signed)
-----   Message from Wyatt Portela, MD sent at 02/09/2020  8:46 AM EDT ----- Please let him know his PSA is up but no changes needed

## 2020-02-09 NOTE — Telephone Encounter (Signed)
Notified of message below

## 2020-02-10 ENCOUNTER — Inpatient Hospital Stay: Payer: Medicare Other

## 2020-02-10 ENCOUNTER — Other Ambulatory Visit: Payer: Self-pay

## 2020-02-10 VITALS — BP 148/76 | HR 86 | Temp 98.4°F | Resp 22

## 2020-02-10 DIAGNOSIS — C61 Malignant neoplasm of prostate: Secondary | ICD-10-CM

## 2020-02-10 DIAGNOSIS — C7951 Secondary malignant neoplasm of bone: Secondary | ICD-10-CM | POA: Diagnosis not present

## 2020-02-10 DIAGNOSIS — Z5189 Encounter for other specified aftercare: Secondary | ICD-10-CM | POA: Diagnosis not present

## 2020-02-10 DIAGNOSIS — Z5111 Encounter for antineoplastic chemotherapy: Secondary | ICD-10-CM | POA: Diagnosis not present

## 2020-02-10 MED ORDER — PEGFILGRASTIM INJECTION 6 MG/0.6ML ~~LOC~~
6.0000 mg | PREFILLED_SYRINGE | Freq: Once | SUBCUTANEOUS | Status: AC
  Administered 2020-02-10: 6 mg via SUBCUTANEOUS

## 2020-02-10 NOTE — Patient Instructions (Signed)

## 2020-02-19 ENCOUNTER — Telehealth: Payer: Self-pay | Admitting: *Deleted

## 2020-02-19 NOTE — Telephone Encounter (Signed)
Received call from pt's wife stating that pt is having a lot of problems breathing.  He is panting with movement & she has noticed some wheezing like asthma.  She states that he is ok sitting still & this is with activity.  She reports no underlying lung problems.  He had treatment 7/1 with Docetaxel & neulasta on 7/3.  Hgb has been on low side & he has had @ 9 treatments.  She states that he is eating & drinking OK.  He has some swelling in feet/ankles.  He also has incontinence & odor is foul.  Dr Dahlstedt's office prescribed something but it hasn't helped. Encouraged to call that office to see if they have further suggestions.  Wondering if Dr Alen Blew would like pt to come in am for labs & CXR? Informed wife that we would discuss with him.  Routed call to Dr Alen Blew.

## 2020-02-28 ENCOUNTER — Inpatient Hospital Stay: Payer: Medicare Other

## 2020-02-28 ENCOUNTER — Inpatient Hospital Stay (HOSPITAL_BASED_OUTPATIENT_CLINIC_OR_DEPARTMENT_OTHER): Payer: Medicare Other | Admitting: Oncology

## 2020-02-28 ENCOUNTER — Other Ambulatory Visit: Payer: Self-pay

## 2020-02-28 VITALS — BP 152/73 | HR 80 | Temp 97.5°F | Resp 17 | Ht 70.0 in | Wt 230.9 lb

## 2020-02-28 DIAGNOSIS — Z5189 Encounter for other specified aftercare: Secondary | ICD-10-CM | POA: Diagnosis not present

## 2020-02-28 DIAGNOSIS — C61 Malignant neoplasm of prostate: Secondary | ICD-10-CM

## 2020-02-28 DIAGNOSIS — Z5111 Encounter for antineoplastic chemotherapy: Secondary | ICD-10-CM | POA: Diagnosis not present

## 2020-02-28 DIAGNOSIS — Z95828 Presence of other vascular implants and grafts: Secondary | ICD-10-CM

## 2020-02-28 DIAGNOSIS — C7951 Secondary malignant neoplasm of bone: Secondary | ICD-10-CM | POA: Diagnosis not present

## 2020-02-28 LAB — CMP (CANCER CENTER ONLY)
ALT: 14 U/L (ref 0–44)
AST: 14 U/L — ABNORMAL LOW (ref 15–41)
Albumin: 3.4 g/dL — ABNORMAL LOW (ref 3.5–5.0)
Alkaline Phosphatase: 90 U/L (ref 38–126)
Anion gap: 8 (ref 5–15)
BUN: 8 mg/dL (ref 8–23)
CO2: 17 mmol/L — ABNORMAL LOW (ref 22–32)
Calcium: 9.1 mg/dL (ref 8.9–10.3)
Chloride: 117 mmol/L — ABNORMAL HIGH (ref 98–111)
Creatinine: 0.83 mg/dL (ref 0.61–1.24)
GFR, Est AFR Am: >60 mL/min (ref >60)
GFR, Estimated: >60 mL/min (ref >60)
Glucose, Bld: 89 mg/dL (ref 70–99)
Potassium: 3.9 mmol/L (ref 3.5–5.1)
Sodium: 142 mmol/L (ref 135–145)
Total Bilirubin: 0.3 mg/dL (ref 0.3–1.2)
Total Protein: 6.9 g/dL (ref 6.5–8.1)

## 2020-02-28 LAB — CBC WITH DIFFERENTIAL (CANCER CENTER ONLY)
Abs Immature Granulocytes: 0.07 10*3/uL (ref 0.00–0.07)
Basophils Absolute: 0 10*3/uL (ref 0.0–0.1)
Basophils Relative: 0 %
Eosinophils Absolute: 0 10*3/uL (ref 0.0–0.5)
Eosinophils Relative: 0 %
HCT: 32.8 % — ABNORMAL LOW (ref 39.0–52.0)
Hemoglobin: 10.5 g/dL — ABNORMAL LOW (ref 13.0–17.0)
Immature Granulocytes: 1 %
Lymphocytes Relative: 35 %
Lymphs Abs: 2.1 10*3/uL (ref 0.7–4.0)
MCH: 31.1 pg (ref 26.0–34.0)
MCHC: 32 g/dL (ref 30.0–36.0)
MCV: 97 fL (ref 80.0–100.0)
Monocytes Absolute: 1 10*3/uL (ref 0.1–1.0)
Monocytes Relative: 16 %
Neutro Abs: 3 10*3/uL (ref 1.7–7.7)
Neutrophils Relative %: 48 %
Platelet Count: 170 10*3/uL (ref 150–400)
RBC: 3.38 MIL/uL — ABNORMAL LOW (ref 4.22–5.81)
RDW: 18 % — ABNORMAL HIGH (ref 11.5–15.5)
WBC Count: 6.1 10*3/uL (ref 4.0–10.5)
nRBC: 0 % (ref 0.0–0.2)

## 2020-02-28 MED ORDER — SODIUM CHLORIDE 0.9 % IV SOLN
Freq: Once | INTRAVENOUS | Status: AC
  Filled 2020-02-28: qty 250

## 2020-02-28 MED ORDER — SODIUM CHLORIDE 0.9% FLUSH
10.0000 mL | INTRAVENOUS | Status: DC | PRN
  Administered 2020-02-28: 10 mL
  Filled 2020-02-28: qty 10

## 2020-02-28 MED ORDER — SODIUM CHLORIDE 0.9 % IV SOLN
60.0000 mg/m2 | Freq: Once | INTRAVENOUS | Status: AC
  Administered 2020-02-28: 130 mg via INTRAVENOUS
  Filled 2020-02-28: qty 13

## 2020-02-28 MED ORDER — SODIUM CHLORIDE 0.9% FLUSH
10.0000 mL | Freq: Once | INTRAVENOUS | Status: AC
  Administered 2020-02-28: 10 mL
  Filled 2020-02-28: qty 10

## 2020-02-28 MED ORDER — SODIUM CHLORIDE 0.9 % IV SOLN
10.0000 mg | Freq: Once | INTRAVENOUS | Status: AC
  Administered 2020-02-28: 10 mg via INTRAVENOUS
  Filled 2020-02-28: qty 10

## 2020-02-28 MED ORDER — HEPARIN SOD (PORK) LOCK FLUSH 100 UNIT/ML IV SOLN
500.0000 [IU] | Freq: Once | INTRAVENOUS | Status: AC | PRN
  Administered 2020-02-28: 500 [IU]
  Filled 2020-02-28: qty 5

## 2020-02-28 NOTE — Patient Instructions (Signed)
[facility] Discharge Instructions for Patients Receiving Chemotherapy  Today you received the following chemotherapy agents: Taxotere  To help prevent nausea and vomiting after your treatment, we encourage you to take your nausea medication as directed.    If you develop nausea and vomiting that is not controlled by your nausea medication, call the clinic.   BELOW ARE SYMPTOMS THAT SHOULD BE REPORTED IMMEDIATELY:  *FEVER GREATER THAN 100.5 F  *CHILLS WITH OR WITHOUT FEVER  NAUSEA AND VOMITING THAT IS NOT CONTROLLED WITH YOUR NAUSEA MEDICATION  *UNUSUAL SHORTNESS OF BREATH  *UNUSUAL BRUISING OR BLEEDING  TENDERNESS IN MOUTH AND THROAT WITH OR WITHOUT PRESENCE OF ULCERS  *URINARY PROBLEMS  *BOWEL PROBLEMS  UNUSUAL RASH Items with * indicate a potential emergency and should be followed up as soon as possible.  Feel free to call the clinic should you have any questions or concerns. The clinic phone number is [phone].  Please show the CHEMO ALERT CARD at check-in to the Emergency Department and triage nurse.   

## 2020-02-28 NOTE — Progress Notes (Signed)
Hematology and Oncology Follow Up Visit  Ronnie [name] [id number] [birth year] 84 y.o. 02/28/2020 12:08 PM Ronnie [name], MDVincent, Ronnie [name],*   Principle Diagnosis: 84 year old man with advanced prostate cancer with disease to the bone diagnosed 2018.  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   Prior Therapy: He was treated with androgen deprivation as well as definitive radiation therapy. His PSA nadir was 0.5 in 2009.   He developed advanced disease with PSA in February 2015 was up to 36.35 and a fluoride PET scan showed measurable disease in the second rib as well as the 10th thoracic vertebral body. He was started on androgen deprivation since that time. He had an excellent PSA response initially with a nadir down to 0.08 in April 2016.   His PSA was 0.8 in February 2017 and Casodex was added.   In April 2017 his PSA was 0.28 and started to rise again in July 2017. His PSA was 0.44 and subsequently was 1.09 in October 2017. On 10/09/2016 his PSA was 3.39. His testosterone level was 30.4. Casodex was discontinued in April 2018. His PSA was 7.7 in August 2018.  Xtandi 160 mg daily started on 04/08/2017.  Therapy discontinued in November 2020 for progression of disease.   Current therapy:  Taxotere chemotherapy 75 mg per metered square started  on July 13, 2019.  He is currently receiving 60 mg/m every 3 weeks and here for cycle 10.    Androgen deprivation under the care of [facility].  Interim History: Ronnie [name] is here for a follow-up visit.  Since the last visit, he tolerated the last cycle of chemotherapy without any complaints.  He denies any nausea vomiting or abdominal pain.  He denies any recent hospitalization or illnesses.  He denies any worsening neuropathy or excessive fatigue.  He did report mild lower extremity edema.   Medications: Updated on review. Current Outpatient  Medications  Medication Sig Dispense Refill  . alendronate (FOSAMAX) 70 MG tablet Take 70 mg by mouth once a week.     Marland Kitchen amLODipine (NORVASC) 10 MG tablet TAKE 1 TABLET (10 MG TOTAL) DAILY BY MOUTH. 90 tablet 3  . cetirizine (ZYRTEC ALLERGY) 10 MG tablet Take 1 tablet (10 mg total) by mouth daily. 30 tablet 2  . [facility] MAGNESIUM CITRATE 1.745 GM/30ML SOLN PLEASE SEE ATTACHED FOR DETAILED DIRECTIONS    . enzalutamide (XTANDI) 40 MG capsule Take by mouth.    . fluticasone (FLONASE) 50 MCG/ACT nasal spray Place 1 spray into both nostrils daily. 16 g 2  . Hypromellose (ARTIFICIAL TEARS OP) Place 1 drop into the left eye daily.    . Hypromellose 0.3 % SOLN Apply to eye.    . ILEVRO 0.3 % ophthalmic suspension Place 1 drop into the right eye daily.   2  . lidocaine (LIDODERM) 5 % Place 1 patch onto the skin daily. Remove & Discard patch within 12 hours or as directed by MD 30 patch 0  . megestrol (MEGACE) 400 MG/10ML suspension Take 10 mLs (400 mg total) by mouth 2 (two) times daily. 240 mL 0  . pravastatin (PRAVACHOL) 20 MG tablet TAKE 1 TABLET BY MOUTH EVERY DAY IN THE EVENING (Patient taking differently: Take 20 mg by mouth daily. ) 90 tablet 3  . prochlorperazine (COMPAZINE) 10 MG tablet Take 1 tablet (10 mg total) by mouth every 6 (six) hours as needed for nausea or vomiting. 30 tablet 0  .  senna-docusate (SENNA S) 8.6-50 MG tablet Take 1 tablet by mouth 2 (two) times daily. 60 tablet 3   No current facility-administered medications for this visit.     Allergies:  Allergies  Allergen Reactions  . Aspirin Nausea Only    Patient stated,"[name] get nauseated with higher doses of Aspirin." Patient stated,"[name] get nauseated with higher doses of Aspirin." Patient stated,"[name] get nauseated with higher doses of Aspirin."      Physical Exam:       Blood pressure (!) 152/73, pulse 80, temperature (!) 97.5 F (36.4 C), temperature source Temporal, resp. rate 17, height 5\' 10"  (1.778 m), weight 230 lb  14.4 oz (104.7 kg), SpO2 100 %.      ECOG: 1    General appearance: Comfortable appearing without any discomfort Head: Normocephalic without any trauma Oropharynx: Mucous membranes are moist and pink without any thrush or ulcers. Eyes: Pupils are equal and round reactive to light. Lymph nodes: No cervical, supraclavicular, inguinal or axillary lymphadenopathy.   Heart:regular rate and rhythm.  S1 and S2 without leg edema. Lung: Clear without any rhonchi or wheezes.  No dullness to percussion. Abdomin: Soft, nontender, nondistended with good bowel sounds.  No hepatosplenomegaly. Musculoskeletal: No joint deformity or effusion.  Full range of motion noted. Neurological: No deficits noted on motor, sensory and deep tendon reflex exam. Skin: No petechial rash or dryness.  Appeared moist.                       Lab Results: Lab Results  Component Value Date   WBC 6.1 02/28/2020   HGB 10.5 (L) 02/28/2020   HCT 32.8 (L) 02/28/2020   MCV 97.0 02/28/2020   PLT 170 02/28/2020     Chemistry      Component Value Date/Time   NA 140 02/08/2020 1015   NA 141 08/12/2017 1258   K 4.2 02/08/2020 1015   K 3.4 (L) 08/12/2017 1258   CL 114 (H) 02/08/2020 1015   CO2 18 (L) 02/08/2020 1015   CO2 27 08/12/2017 1258   BUN 14 02/08/2020 1015   BUN 14.9 08/12/2017 1258   CREATININE 0.90 02/08/2020 1015   CREATININE 1.0 08/12/2017 1258      Component Value Date/Time   CALCIUM 8.5 (L) 02/08/2020 1015   CALCIUM 9.5 08/12/2017 1258   ALKPHOS 89 02/08/2020 1015   ALKPHOS 62 08/12/2017 1258   AST 13 (L) 02/08/2020 1015   AST 36 (H) 08/12/2017 1258   ALT 18 02/08/2020 1015   ALT 43 08/12/2017 1258   BILITOT 0.3 02/08/2020 1015   BILITOT 0.34 08/12/2017 1258          Results for Ronnie [name], Ronnie [name] (Ronnie [name]) as of 02/28/2020 12:09  Ref. Range 09/14/2019 09:27 10/05/2019 11:42 10/26/2019 08:15 11/16/2019 11:35 12/07/2019 11:35 12/28/2019 13:37 01/18/2020 12:20 02/08/2020 10:15   Prostate Specific Ag, Serum Latest Ref Range: 0.0 - 4.0 ng/mL 67.4 (H) 127.0 (H) 67.4 (H) 63.2 (H) 45.9 (H) 39.0 (H) 33.1 (H) 48.0 (H)        Impression and Plan:   84 year old man with:  1.  Castration-resistant prostate cancer with disease to the bone diagnosed in 2018.  Marland Kitchen   The natural course of this disease was discussed at this time.  Risks and benefits of completing chemotherapy were reviewed.  Complications include nausea, fatigue myelosuppression as well as edema were reiterated.  He is agreeable to proceed at this time.  His PSA started to rise and likely  will require different salvage therapy after completing treatment.  He will finish cycle 10 of Taxotere chemotherapy and will proceed with a treatment break and will reevaluate after that for different salvage options.  [name] will obtain guardant 360 analysis for the next visit to assess for possible actionable mutation and the use of PARP inhibitors.   2. Androgen deprivation therapy: He will continue to receive that under the care of [facility].  [name] recommend continuing this indefinitely.   3. Bone directed therapy: He is currently on Fosamax which [name] recommended continuing for the time being.   4.  Prognosis and goals of care: Therapy remains palliative although aggressive measures are warranted given his reasonable performance status.  5.  Anorexia: His appetite is improved and continues to eat at this time.  6.  IV access: Port-A-Cath remains accessed without any issues.   7.  Growth factor support: He will continue to receive growth factor support after each cycle of therapy given his high risk of neutropenia and sepsis.  8. Follow-up: In 3 to 4 weeks for a follow-up evaluation.   30  minutes were dedicated to this visit.  Time was spent reviewing laboratory data, discussing disease status, addressing complication related to his cancer and cancer therapy.    Zola Button, MD 7/21/202112:08 PM

## 2020-02-29 ENCOUNTER — Telehealth: Payer: Self-pay

## 2020-02-29 LAB — PROSTATE-SPECIFIC AG, SERUM (LABCORP): Prostate Specific Ag, Serum: 29.3 ng/mL — ABNORMAL HIGH (ref 0.0–4.0)

## 2020-02-29 NOTE — Telephone Encounter (Signed)
-----   Message from Tami Lin, RN sent at 02/29/2020  9:31 AM EDT -----  ----- Message ----- From: Wyatt Portela, MD Sent: 02/29/2020   9:05 AM EDT To: Tami Lin, RN  Please let him know his PSA is down

## 2020-02-29 NOTE — Telephone Encounter (Signed)
Called patient made him aware of PSA results. Patient verbalized understanding.

## 2020-03-01 ENCOUNTER — Other Ambulatory Visit: Payer: Self-pay

## 2020-03-01 ENCOUNTER — Inpatient Hospital Stay: Payer: Medicare Other

## 2020-03-01 VITALS — BP 160/74 | HR 100 | Resp 19

## 2020-03-01 DIAGNOSIS — C61 Malignant neoplasm of prostate: Secondary | ICD-10-CM

## 2020-03-01 DIAGNOSIS — Z5111 Encounter for antineoplastic chemotherapy: Secondary | ICD-10-CM | POA: Diagnosis not present

## 2020-03-01 DIAGNOSIS — C7951 Secondary malignant neoplasm of bone: Secondary | ICD-10-CM | POA: Diagnosis not present

## 2020-03-01 DIAGNOSIS — Z5189 Encounter for other specified aftercare: Secondary | ICD-10-CM | POA: Diagnosis not present

## 2020-03-01 MED ORDER — PEGFILGRASTIM INJECTION 6 MG/0.6ML ~~LOC~~
6.0000 mg | PREFILLED_SYRINGE | Freq: Once | SUBCUTANEOUS | Status: AC
  Administered 2020-03-01: 6 mg via SUBCUTANEOUS

## 2020-03-01 MED ORDER — PEGFILGRASTIM INJECTION 6 MG/0.6ML ~~LOC~~
PREFILLED_SYRINGE | SUBCUTANEOUS | Status: AC
  Filled 2020-03-01: qty 0.6

## 2020-03-01 NOTE — Patient Instructions (Signed)

## 2020-03-07 ENCOUNTER — Telehealth: Payer: Self-pay | Admitting: Oncology

## 2020-03-07 NOTE — Telephone Encounter (Signed)
Scheduled per 07/21 los, patient has been called and notified. °

## 2020-03-08 DIAGNOSIS — R8271 Bacteriuria: Secondary | ICD-10-CM | POA: Diagnosis not present

## 2020-03-27 ENCOUNTER — Other Ambulatory Visit: Payer: Self-pay

## 2020-03-27 ENCOUNTER — Inpatient Hospital Stay: Payer: Medicare Other | Attending: Oncology

## 2020-03-27 ENCOUNTER — Inpatient Hospital Stay: Payer: Medicare Other

## 2020-03-27 DIAGNOSIS — C61 Malignant neoplasm of prostate: Secondary | ICD-10-CM | POA: Diagnosis not present

## 2020-03-27 DIAGNOSIS — Z95828 Presence of other vascular implants and grafts: Secondary | ICD-10-CM

## 2020-03-27 DIAGNOSIS — C7951 Secondary malignant neoplasm of bone: Secondary | ICD-10-CM | POA: Insufficient documentation

## 2020-03-27 LAB — CMP (CANCER CENTER ONLY)
ALT: 7 U/L (ref 0–44)
AST: 15 U/L (ref 15–41)
Albumin: 3.3 g/dL — ABNORMAL LOW (ref 3.5–5.0)
Alkaline Phosphatase: 97 U/L (ref 38–126)
Anion gap: 8 (ref 5–15)
BUN: 6 mg/dL — ABNORMAL LOW (ref 8–23)
CO2: 21 mmol/L — ABNORMAL LOW (ref 22–32)
Calcium: 9.4 mg/dL (ref 8.9–10.3)
Chloride: 110 mmol/L (ref 98–111)
Creatinine: 0.84 mg/dL (ref 0.61–1.24)
GFR, Est AFR Am: >60 mL/min (ref >60)
GFR, Estimated: >60 mL/min (ref >60)
Glucose, Bld: 95 mg/dL (ref 70–99)
Potassium: 3.9 mmol/L (ref 3.5–5.1)
Sodium: 139 mmol/L (ref 135–145)
Total Bilirubin: 0.3 mg/dL (ref 0.3–1.2)
Total Protein: 7.2 g/dL (ref 6.5–8.1)

## 2020-03-27 LAB — CBC WITH DIFFERENTIAL (CANCER CENTER ONLY)
Abs Immature Granulocytes: 0.01 10*3/uL (ref 0.00–0.07)
Basophils Absolute: 0 10*3/uL (ref 0.0–0.1)
Basophils Relative: 1 %
Eosinophils Absolute: 0 10*3/uL (ref 0.0–0.5)
Eosinophils Relative: 1 %
HCT: 33.5 % — ABNORMAL LOW (ref 39.0–52.0)
Hemoglobin: 10.6 g/dL — ABNORMAL LOW (ref 13.0–17.0)
Immature Granulocytes: 0 %
Lymphocytes Relative: 43 %
Lymphs Abs: 1.9 10*3/uL (ref 0.7–4.0)
MCH: 29.8 pg (ref 26.0–34.0)
MCHC: 31.6 g/dL (ref 30.0–36.0)
MCV: 94.1 fL (ref 80.0–100.0)
Monocytes Absolute: 0.5 10*3/uL (ref 0.1–1.0)
Monocytes Relative: 13 %
Neutro Abs: 1.8 10*3/uL (ref 1.7–7.7)
Neutrophils Relative %: 42 %
Platelet Count: 188 10*3/uL (ref 150–400)
RBC: 3.56 MIL/uL — ABNORMAL LOW (ref 4.22–5.81)
RDW: 15.6 % — ABNORMAL HIGH (ref 11.5–15.5)
WBC Count: 4.3 10*3/uL (ref 4.0–10.5)
nRBC: 0 % (ref 0.0–0.2)

## 2020-03-27 MED ORDER — HEPARIN SOD (PORK) LOCK FLUSH 100 UNIT/ML IV SOLN
500.0000 [IU] | Freq: Once | INTRAVENOUS | Status: AC
  Administered 2020-03-27: 500 [IU]
  Filled 2020-03-27: qty 5

## 2020-03-27 MED ORDER — SODIUM CHLORIDE 0.9% FLUSH
10.0000 mL | Freq: Once | INTRAVENOUS | Status: AC
  Administered 2020-03-27: 10 mL
  Filled 2020-03-27: qty 10

## 2020-03-28 LAB — PROSTATE-SPECIFIC AG, SERUM (LABCORP): Prostate Specific Ag, Serum: 28 ng/mL — ABNORMAL HIGH (ref 0.0–4.0)

## 2020-04-04 ENCOUNTER — Other Ambulatory Visit: Payer: Self-pay | Admitting: Student in an Organized Health Care Education/Training Program

## 2020-04-04 DIAGNOSIS — I10 Essential (primary) hypertension: Secondary | ICD-10-CM

## 2020-04-05 ENCOUNTER — Encounter: Payer: Self-pay | Admitting: Oncology

## 2020-04-08 LAB — GUARDANT 360

## 2020-04-10 ENCOUNTER — Other Ambulatory Visit: Payer: Self-pay

## 2020-04-10 ENCOUNTER — Inpatient Hospital Stay: Payer: Medicare Other | Attending: Oncology | Admitting: Oncology

## 2020-04-10 VITALS — BP 141/90 | HR 58 | Temp 98.8°F | Resp 20 | Ht 70.0 in | Wt 228.4 lb

## 2020-04-10 DIAGNOSIS — C7951 Secondary malignant neoplasm of bone: Secondary | ICD-10-CM | POA: Insufficient documentation

## 2020-04-10 DIAGNOSIS — C61 Malignant neoplasm of prostate: Secondary | ICD-10-CM

## 2020-04-10 NOTE — Progress Notes (Signed)
Hematology and Oncology Follow Up Visit  [facility] RECORD [id number] 12-17-1935 84 y.o. 04/10/2020 3:12 PM [name], MDVincent, [name],*   Principle Diagnosis: 84 year old man with castration-resistant disease with diagnosed 2018.  He presented with Gleason score 4+4 = 8 and has disease to the bone at this time.  Prior Therapy: He was treated with androgen deprivation as well as definitive radiation therapy. His PSA nadir was 0.5 in 2009.   He developed advanced disease with PSA in February 2015 was up to 36.35 and a fluoride PET scan showed measurable disease in the second rib as well as the 10th thoracic vertebral body. He was started on androgen deprivation since that time. He had an excellent PSA response initially with a nadir down to 0.08 in April 2016.   His PSA was 0.8 in February 2017 and Casodex was added.   In April 2017 his PSA was 0.28 and started to rise again in July 2017. His PSA was 0.44 and subsequently was 1.09 in October 2017. On 10/09/2016 his PSA was 3.39. His testosterone level was 30.4. Casodex was discontinued in April 2018. His PSA was 7.7 in August 2018.  Xtandi 160 mg daily started on 04/08/2017.  Therapy discontinued in November 2020 for progression of disease.    Taxotere chemotherapy 75 mg per metered square started  on July 13, 2019.  He received a total of 10 cycles after dose reduction to 60 mg per metered square completed on February 28, 2020.   Current therapy:    Treatment break after completing chemotherapy.  Androgen deprivation under the care of [facility].  Interim History: Mr. Baugher returns today for a routine evaluation.  Since the last visit, he does not report any major complications related to chemotherapy.  He does have some delayed lower extremity edema which is improving slowly.  He is ambulating with the help of a cane without any recent falls or syncope.  He is regaining most activities of daily living at this  time.   Medications: Reviewed today without changes. Current Outpatient Medications  Medication Sig Dispense Refill  . alendronate (FOSAMAX) 70 MG tablet Take 70 mg by mouth once a week.     Marland Kitchen amLODipine (NORVASC) 10 MG tablet TAKE 1 TABLET (10 MG TOTAL) DAILY BY MOUTH. 90 tablet 3  . cetirizine (ZYRTEC ALLERGY) 10 MG tablet Take 1 tablet (10 mg total) by mouth daily. 30 tablet 2  . [facility] MAGNESIUM CITRATE 1.745 GM/30ML SOLN PLEASE SEE ATTACHED FOR DETAILED DIRECTIONS    . enzalutamide (XTANDI) 40 MG capsule Take by mouth.    . fluticasone (FLONASE) 50 MCG/ACT nasal spray Place 1 spray into both nostrils daily. 16 g 2  . Hypromellose (ARTIFICIAL TEARS OP) Place 1 drop into the left eye daily.    . Hypromellose 0.3 % SOLN Apply to eye.    . ILEVRO 0.3 % ophthalmic suspension Place 1 drop into the right eye daily.   2  . lidocaine (LIDODERM) 5 % Place 1 patch onto the skin daily. Remove & Discard patch within 12 hours or as directed by MD 30 patch 0  . megestrol (MEGACE) 400 MG/10ML suspension Take 10 mLs (400 mg total) by mouth 2 (two) times daily. 240 mL 0  . pravastatin (PRAVACHOL) 20 MG tablet TAKE 1 TABLET BY MOUTH EVERY DAY IN THE EVENING (Patient taking differently: Take 20 mg by mouth daily. ) 90 tablet 3  . prochlorperazine (COMPAZINE) 10 MG tablet Take 1 tablet (10 mg  total) by mouth every 6 (six) hours as needed for nausea or vomiting. 30 tablet 0  . senna-docusate (SENNA S) 8.6-50 MG tablet Take 1 tablet by mouth 2 (two) times daily. 60 tablet 3   No current facility-administered medications for this visit.     Allergies:  Allergies  Allergen Reactions  . Aspirin Nausea Only    Patient stated,"I get nauseated with higher doses of Aspirin." Patient stated,"I get nauseated with higher doses of Aspirin." Patient stated,"I get nauseated with higher doses of Aspirin."      Physical Exam:   Blood pressure (!) 141/90, pulse (!) 58, temperature 98.8 F (37.1 C), temperature  source Tympanic, resp. rate 20, height 5\' 10"  (1.778 m), weight 228 lb 6.4 oz (103.6 kg), SpO2 99 %.          ECOG: 1   General appearance: Comfortable appearing without any discomfort Head: Normocephalic without any trauma Oropharynx: Mucous membranes are moist and pink without any thrush or ulcers. Eyes: Pupils are equal and round reactive to light. Lymph nodes: No cervical, supraclavicular, inguinal or axillary lymphadenopathy.   Heart:regular rate and rhythm.  S1 and S2.  Trace edema bilaterally. Lung: Clear without any rhonchi or wheezes.  No dullness to percussion. Abdomin: Soft, nontender, nondistended with good bowel sounds.  No hepatosplenomegaly. Musculoskeletal: No joint deformity or effusion.  Full range of motion noted. Neurological: No deficits noted on motor, sensory and deep tendon reflex exam. Skin: No petechial rash or dryness.  Appeared moist.                       Lab Results: Lab Results  Component Value Date   WBC 4.3 03/27/2020   HGB 10.6 (L) 03/27/2020   HCT 33.5 (L) 03/27/2020   MCV 94.1 03/27/2020   PLT 188 03/27/2020     Chemistry      Component Value Date/Time   NA 139 03/27/2020 1119   NA 141 08/12/2017 1258   K 3.9 03/27/2020 1119   K 3.4 (L) 08/12/2017 1258   CL 110 03/27/2020 1119   CO2 21 (L) 03/27/2020 1119   CO2 27 08/12/2017 1258   BUN 6 (L) 03/27/2020 1119   BUN 14.9 08/12/2017 1258   CREATININE 0.84 03/27/2020 1119   CREATININE 1.0 08/12/2017 1258      Component Value Date/Time   CALCIUM 9.4 03/27/2020 1119   CALCIUM 9.5 08/12/2017 1258   ALKPHOS 97 03/27/2020 1119   ALKPHOS 62 08/12/2017 1258   AST 15 03/27/2020 1119   AST 36 (H) 08/12/2017 1258   ALT 7 03/27/2020 1119   ALT 43 08/12/2017 1258   BILITOT 0.3 03/27/2020 1119   BILITOT 0.34 08/12/2017 1258        Results for KHALID, LACKO I (MRN [id number]) as of 04/10/2020 13:48  Ref. Range 08/24/2019 08:42 09/14/2019 09:27 10/05/2019 11:42 10/26/2019  08:15 11/16/2019 11:35 12/07/2019 11:35 12/28/2019 13:37 01/18/2020 12:20 02/08/2020 10:15 02/28/2020 11:43 03/27/2020 11:19  Prostate Specific Ag, Serum Latest Ref Range: 0.0 - 4.0 ng/mL 57.7 (H) 67.4 (H) 127.0 (H) 67.4 (H) 63.2 (H) 45.9 (H) 39.0 (H) 33.1 (H) 48.0 (H) 29.3 (H) 28.0 (H)           Impression and Plan:   85 year old man with:  1.  Advanced prostate cancer with disease to the bone diagnosed in 2018.  He has castration-resistant at this time.   He has completed Taxotere chemotherapy without any major delayed complications.  Laboratory data from August  18 were reviewed today and his disease status was updated.  His guardant 360 analysis did not show any actionable mutation and would not benefit from a PARP inhibitor.  His PSA continues to show excellent response and dropped from 127 to 28.  The natural course of his disease was reiterated today and explained to him and his wife.  He understands he has an incurable malignancy and the treatment so far has been successful in reducing his cancer burden and completing his symptoms.  For the time being given his reasonable control of his disease I have opted to have a period of active surveillance and repeat staging work-up in the next 2 months and potentially initiate anticancer treatment after that.  Options for treatment at this time will be Jevtana chemotherapy or Xofigo.  He is in agreement with this plan and understands the steps moving forward.   2. Androgen deprivation therapy: I recommended continuing this indefinitely and he is currently receiving under care of [facility].   3. Bone directed therapy: He continues to be on Fosamax without any complications.  This is recommended to be continued at this time.   4.  Prognosis and goals of care: His performance status is reasonable and rested measures in the future would be reasonable.  5.  Anorexia: Improved at this time and eating well currently.   6.  IV access:  Port-A-Cath will continue to be flushed periodically.  7. Follow-up: He will return in 2 months for repeat staging scans and follow-up.   30  minutes were spent on this encounter.  The time was dedicated to updating his disease status, reviewing laboratory data and outlining future plan of care.    Zola Button, MD 9/1/20213:12 PM

## 2020-04-20 ENCOUNTER — Other Ambulatory Visit: Payer: Self-pay | Admitting: Student in an Organized Health Care Education/Training Program

## 2020-04-20 DIAGNOSIS — I10 Essential (primary) hypertension: Secondary | ICD-10-CM

## 2020-05-10 ENCOUNTER — Other Ambulatory Visit: Payer: Self-pay | Admitting: Student in an Organized Health Care Education/Training Program

## 2020-05-10 DIAGNOSIS — I10 Essential (primary) hypertension: Secondary | ICD-10-CM

## 2020-05-29 DIAGNOSIS — C7951 Secondary malignant neoplasm of bone: Secondary | ICD-10-CM | POA: Diagnosis not present

## 2020-05-29 DIAGNOSIS — N35012 Post-traumatic membranous urethral stricture: Secondary | ICD-10-CM | POA: Diagnosis not present

## 2020-05-29 DIAGNOSIS — Z192 Hormone resistant malignancy status: Secondary | ICD-10-CM | POA: Diagnosis not present

## 2020-06-07 ENCOUNTER — Telehealth: Payer: Self-pay | Admitting: Oncology

## 2020-06-07 NOTE — Telephone Encounter (Signed)
Scheduled per 09/01 los, patient has been called and notified.

## 2020-06-11 DIAGNOSIS — Z5111 Encounter for antineoplastic chemotherapy: Secondary | ICD-10-CM | POA: Diagnosis not present

## 2020-06-18 ENCOUNTER — Other Ambulatory Visit: Payer: Medicare Other

## 2020-06-19 ENCOUNTER — Other Ambulatory Visit: Payer: Self-pay | Admitting: Student in an Organized Health Care Education/Training Program

## 2020-06-20 ENCOUNTER — Inpatient Hospital Stay: Payer: Medicare Other | Attending: Oncology

## 2020-06-20 ENCOUNTER — Other Ambulatory Visit: Payer: Self-pay

## 2020-06-20 ENCOUNTER — Ambulatory Visit (HOSPITAL_COMMUNITY)
Admission: RE | Admit: 2020-06-20 | Discharge: 2020-06-20 | Disposition: A | Discharge status: Home / Self Care | Payer: Medicare Other | Source: Ambulatory Visit | Attending: Oncology | Admitting: Oncology

## 2020-06-20 ENCOUNTER — Encounter (HOSPITAL_COMMUNITY)
Admission: RE | Admit: 2020-06-20 | Discharge: 2020-06-20 | Disposition: A | Discharge status: Home / Self Care | Payer: Medicare Other | Source: Ambulatory Visit | Attending: Oncology | Admitting: Oncology

## 2020-06-20 ENCOUNTER — Inpatient Hospital Stay: Payer: Medicare Other

## 2020-06-20 DIAGNOSIS — C61 Malignant neoplasm of prostate: Secondary | ICD-10-CM

## 2020-06-20 DIAGNOSIS — R0789 Other chest pain: Secondary | ICD-10-CM | POA: Diagnosis not present

## 2020-06-20 DIAGNOSIS — R630 Anorexia: Secondary | ICD-10-CM | POA: Insufficient documentation

## 2020-06-20 DIAGNOSIS — K7460 Unspecified cirrhosis of liver: Secondary | ICD-10-CM | POA: Diagnosis not present

## 2020-06-20 DIAGNOSIS — I2510 Atherosclerotic heart disease of native coronary artery without angina pectoris: Secondary | ICD-10-CM | POA: Diagnosis not present

## 2020-06-20 DIAGNOSIS — Z95828 Presence of other vascular implants and grafts: Secondary | ICD-10-CM

## 2020-06-20 DIAGNOSIS — C7951 Secondary malignant neoplasm of bone: Secondary | ICD-10-CM | POA: Insufficient documentation

## 2020-06-20 DIAGNOSIS — E278 Other specified disorders of adrenal gland: Secondary | ICD-10-CM | POA: Diagnosis not present

## 2020-06-20 DIAGNOSIS — M5459 Other low back pain: Secondary | ICD-10-CM | POA: Insufficient documentation

## 2020-06-20 LAB — CMP (CANCER CENTER ONLY)
ALT: 32 U/L (ref 0–44)
AST: 37 U/L (ref 15–41)
Albumin: 2.8 g/dL — ABNORMAL LOW (ref 3.5–5.0)
Alkaline Phosphatase: 125 U/L (ref 38–126)
Anion gap: 11 (ref 5–15)
BUN: 8 mg/dL (ref 8–23)
CO2: 21 mmol/L — ABNORMAL LOW (ref 22–32)
Calcium: 9 mg/dL (ref 8.9–10.3)
Chloride: 107 mmol/L (ref 98–111)
Creatinine: 0.75 mg/dL (ref 0.61–1.24)
GFR, Estimated: >60 mL/min (ref >60)
Glucose, Bld: 118 mg/dL — ABNORMAL HIGH (ref 70–99)
Potassium: 3.8 mmol/L (ref 3.5–5.1)
Sodium: 139 mmol/L (ref 135–145)
Total Bilirubin: 0.4 mg/dL (ref 0.3–1.2)
Total Protein: 8.2 g/dL — ABNORMAL HIGH (ref 6.5–8.1)

## 2020-06-20 LAB — CBC WITH DIFFERENTIAL (CANCER CENTER ONLY)
Abs Immature Granulocytes: 0.01 10*3/uL (ref 0.00–0.07)
Basophils Absolute: 0 10*3/uL (ref 0.0–0.1)
Basophils Relative: 0 %
Eosinophils Absolute: 0 10*3/uL (ref 0.0–0.5)
Eosinophils Relative: 1 %
HCT: 35.7 % — ABNORMAL LOW (ref 39.0–52.0)
Hemoglobin: 11.4 g/dL — ABNORMAL LOW (ref 13.0–17.0)
Immature Granulocytes: 0 %
Lymphocytes Relative: 35 %
Lymphs Abs: 2.2 10*3/uL (ref 0.7–4.0)
MCH: 26.3 pg (ref 26.0–34.0)
MCHC: 31.9 g/dL (ref 30.0–36.0)
MCV: 82.4 fL (ref 80.0–100.0)
Monocytes Absolute: 0.6 10*3/uL (ref 0.1–1.0)
Monocytes Relative: 10 %
Neutro Abs: 3.3 10*3/uL (ref 1.7–7.7)
Neutrophils Relative %: 54 %
Platelet Count: 341 10*3/uL (ref 150–400)
RBC: 4.33 MIL/uL (ref 4.22–5.81)
RDW: 15.4 % (ref 11.5–15.5)
WBC Count: 6.2 10*3/uL (ref 4.0–10.5)
nRBC: 0 % (ref 0.0–0.2)

## 2020-06-20 MED ORDER — IOHEXOL 300 MG/ML  SOLN
100.0000 mL | Freq: Once | INTRAMUSCULAR | Status: AC | PRN
  Administered 2020-06-20: 100 mL via INTRAVENOUS

## 2020-06-20 MED ORDER — SODIUM CHLORIDE 0.9% FLUSH
10.0000 mL | Freq: Once | INTRAVENOUS | Status: AC
  Administered 2020-06-20: 10 mL
  Filled 2020-06-20: qty 10

## 2020-06-20 MED ORDER — TECHNETIUM TC 99M MEDRONATE IV KIT
19.6000 | PACK | Freq: Once | INTRAVENOUS | Status: AC
  Administered 2020-06-20: 19.6 via INTRAVENOUS

## 2020-06-20 MED ORDER — HEPARIN SOD (PORK) LOCK FLUSH 100 UNIT/ML IV SOLN
500.0000 [IU] | Freq: Once | INTRAVENOUS | Status: AC
  Administered 2020-06-20: 500 [IU] via INTRAVENOUS

## 2020-06-20 MED ORDER — HEPARIN SOD (PORK) LOCK FLUSH 100 UNIT/ML IV SOLN
INTRAVENOUS | Status: AC
  Filled 2020-06-20: qty 5

## 2020-06-21 LAB — PROSTATE-SPECIFIC AG, SERUM (LABCORP): Prostate Specific Ag, Serum: 49.3 ng/mL — ABNORMAL HIGH (ref 0.0–4.0)

## 2020-06-25 ENCOUNTER — Inpatient Hospital Stay: Payer: Medicare Other | Admitting: Oncology

## 2020-06-25 ENCOUNTER — Other Ambulatory Visit: Payer: Self-pay

## 2020-06-25 VITALS — BP 125/69 | HR 100 | Temp 97.7°F | Resp 18 | Ht 70.0 in | Wt 201.7 lb

## 2020-06-25 DIAGNOSIS — C61 Malignant neoplasm of prostate: Secondary | ICD-10-CM | POA: Diagnosis not present

## 2020-06-25 DIAGNOSIS — R0789 Other chest pain: Secondary | ICD-10-CM | POA: Diagnosis not present

## 2020-06-25 DIAGNOSIS — M5459 Other low back pain: Secondary | ICD-10-CM | POA: Diagnosis not present

## 2020-06-25 DIAGNOSIS — C7951 Secondary malignant neoplasm of bone: Secondary | ICD-10-CM | POA: Diagnosis not present

## 2020-06-25 MED ORDER — MEGESTROL ACETATE 400 MG/10ML PO SUSP: 400.0000 mg | Freq: Two times a day (BID) | ORAL | 0 refills | Status: AC

## 2020-06-25 NOTE — Progress Notes (Signed)
Hematology and Oncology Follow Up Visit  [name] [id number] 1935/10/03 84 y.o. 06/25/2020 1:47 PM [name], MDVincent, [name],*   Principle Diagnosis: 84 year old man with advanced prostate cancer with disease in the bone and adenopathy diagnosed 2018.  He was found to have Gleason score 4+4 = 8.   Prior Therapy: He was treated with androgen deprivation as well as definitive radiation therapy. His PSA nadir was 0.5 in 2009.   He developed advanced disease with PSA in February 2015 was up to 36.35 and a fluoride PET scan showed measurable disease in the second rib as well as the 10th thoracic vertebral body. He was started on androgen deprivation since that time. He had an excellent PSA response initially with a nadir down to 0.08 in April 2016.   His PSA was 0.8 in February 2017 and Casodex was added.   In April 2017 his PSA was 0.28 and started to rise again in July 2017. His PSA was 0.44 and subsequently was 1.09 in October 2017. On 10/09/2016 his PSA was 3.39. His testosterone level was 30.4. Casodex was discontinued in April 2018. His PSA was 7.7 in August 2018.  Xtandi 160 mg daily started on 04/08/2017.  Therapy discontinued in November 2020 for progression of disease.    Taxotere chemotherapy 75 mg per metered square started  on July 13, 2019.  He received a total of 10 cycles after dose reduction to 60 mg per metered square completed on February 28, 2020.   Current therapy:   Under consideration for different salvage therapy.  Androgen deprivation under the care of [facility].  Interim History: [name] is here for a follow-up visit.  Since the last visit, he reports gradual decline in his overall health and performance status.  His mobility has been limited and has noted increased chest wall and lower back pain.  His pain is described as soreness and overall manageable.  He denies any recent falls or syncope.  He has lost more weight however.   His quality of life is declining.   Medications: Updated on review. Current Outpatient Medications  Medication Sig Dispense Refill  . alendronate (FOSAMAX) 70 MG tablet Take 70 mg by mouth once a week.     Marland Kitchen amLODipine (NORVASC) 10 MG tablet TAKE 1 TABLET (10 MG TOTAL) DAILY BY MOUTH. 90 tablet 3  . cetirizine (ZYRTEC ALLERGY) 10 MG tablet Take 1 tablet (10 mg total) by mouth daily. 30 tablet 2  . [facility] MAGNESIUM CITRATE 1.745 GM/30ML SOLN PLEASE SEE ATTACHED FOR DETAILED DIRECTIONS    . enzalutamide (XTANDI) 40 MG capsule Take by mouth.    . fluticasone (FLONASE) 50 MCG/ACT nasal spray Place 1 spray into both nostrils daily. 16 g 2  . Hypromellose (ARTIFICIAL TEARS OP) Place 1 drop into the left eye daily.    . Hypromellose 0.3 % SOLN Apply to eye.    . ILEVRO 0.3 % ophthalmic suspension Place 1 drop into the right eye daily.   2  . lidocaine (LIDODERM) 5 % Place 1 patch onto the skin daily. Remove & Discard patch within 12 hours or as directed by MD 30 patch 0  . megestrol (MEGACE) 400 MG/10ML suspension Take 10 mLs (400 mg total) by mouth 2 (two) times daily. 240 mL 0  . pravastatin (PRAVACHOL) 20 MG tablet TAKE 1 TABLET BY MOUTH EVERY DAY IN THE EVENING (Patient taking differently: Take 20 mg by mouth daily. ) 90 tablet 3  . prochlorperazine (COMPAZINE)  10 MG tablet Take 1 tablet (10 mg total) by mouth every 6 (six) hours as needed for nausea or vomiting. 30 tablet 0  . senna-docusate (SENNA S) 8.6-50 MG tablet Take 1 tablet by mouth 2 (two) times daily. 60 tablet 3   No current facility-administered medications for this visit.     Allergies:  Allergies  Allergen Reactions  . Aspirin Nausea Only    Patient stated,"[name] get nauseated with higher doses of Aspirin." Patient stated,"[name] get nauseated with higher doses of Aspirin." Patient stated,"[name] get nauseated with higher doses of Aspirin."      Physical Exam:      Blood pressure 125/69, pulse 100, temperature 97.7 F (36.5 C),  temperature source Tympanic, resp. rate 18, height 5\' 10"  (1.778 m), weight 201 lb 11.2 oz (91.5 kg), SpO2 99 %.       ECOG: 2   General appearance: Alert, awake without any distress. Head: Atraumatic without abnormalities Oropharynx: Without any thrush or ulcers. Eyes: No scleral icterus. Lymph nodes: No lymphadenopathy noted in the cervical, supraclavicular, or axillary nodes Heart:regular rate and rhythm, without any murmurs or gallops.   Lung: Clear to auscultation without any rhonchi, wheezes or dullness to percussion. Abdomin: Soft, nontender without any shifting dullness or ascites. Musculoskeletal: No clubbing or cyanosis. Neurological: No motor or sensory deficits. Skin: No rashes or lesions.                      Lab Results: Lab Results  Component Value Date   WBC 6.2 06/20/2020   HGB 11.4 (L) 06/20/2020   HCT 35.7 (L) 06/20/2020   MCV 82.4 06/20/2020   PLT 341 06/20/2020     Chemistry      Component Value Date/Time   NA 139 06/20/2020 0834   NA 141 08/12/2017 1258   K 3.8 06/20/2020 0834   K 3.4 (L) 08/12/2017 1258   CL 107 06/20/2020 0834   CO2 21 (L) 06/20/2020 0834   CO2 27 08/12/2017 1258   BUN 8 06/20/2020 0834   BUN 14.9 08/12/2017 1258   CREATININE 0.75 06/20/2020 0834   CREATININE 1.0 08/12/2017 1258      Component Value Date/Time   CALCIUM 9.0 06/20/2020 0834   CALCIUM 9.5 08/12/2017 1258   ALKPHOS 125 06/20/2020 0834   ALKPHOS 62 08/12/2017 1258   AST 37 06/20/2020 0834   AST 36 (H) 08/12/2017 1258   ALT 32 06/20/2020 0834   ALT 43 08/12/2017 1258   BILITOT 0.4 06/20/2020 0834   BILITOT 0.34 08/12/2017 1258         Results for [name] (MRN [id number]) as of 06/25/2020 13:35  Ref. Range 03/27/2020 11:19 06/20/2020 08:34  Prostate Specific Ag, Serum Latest Ref Range: 0.0 - 4.0 ng/mL 28.0 (H) 49.3 (H)       IMPRESSION: CT CHEST IMPRESSION  1. Development of thoracic and lower cervical adenopathy  compared to the most recent chest imaging of 10/09/2013. 2. Aortic atherosclerosis (ICD10-I70.0), coronary artery atherosclerosis and emphysema (ICD10-J43.9). 3. Bilateral gynecomastia.  CT ABDOMEN AND PELVIS IMPRESSION  1. Compared to the most recent abdominopelvic imaging of 12/15/2018, relatively similar adenopathy. Some nodes measure larger while others have regressed. 2. Widespread sclerotic osseous metastasis, significantly more apparent than 12/15/2018. This could represent progressive osseous metastasis or healing of previously occult metastasis. 3. Cirrhosis. 4.  Aortic Atherosclerosis (ICD10-I70.0).  FINDINGS: Numerous sites of abnormal osseous tracer uptake are seen consistent with widespread osseous metastatic disease.  These include  scapulae, BILATERAL ribs, thoracic spine, lumbar spine, pelvis, BILATERAL proximal humeri, suspect proximal femora bilaterally.  Multiple observed sites of metastasis are new or progressive since the previous exam.  Expected urinary tract and soft tissue distribution of tracer.  IMPRESSION: Progressive osseous metastatic disease since prior exam.  Impression and Plan:   84 year old man with:  1.  Castration-resistant advanced prostate cancer with disease to the bone diagnosed in 2018.  He has also lymph node involvement.     The natural course of his disease and treatment options were reiterated at this time.  His PSA as well as imaging studies including CT scan and bone scan were discussed today which showed progression of disease.  His disease progression predominantly in the bone although thoracic lymphadenopathy were also noted.  Treatment options at this time would include restarting systemic chemotherapy utilizing Jevtana versus Xofigo.  Risks and benefits of both approaches were discussed.  Trudi Ida will be effective for bone disease only.    After discussion today, he is agreeable to consider Xofigo.  He would be a poor  candidate for systemic chemotherapy.  He also understands of this treatment is ineffective for his disease progresses he will require hospice at that time.   2. Androgen deprivation therapy: He is currently receiving that under the care of [facility].  [name] recommend continuing this for the time being.   3. Bone directed therapy: He is currently on Fosamax without any pathological fractures or bone pain.   4.  Prognosis and goals of care: Therapy is palliative and his disease is incurable.  His performance status is marginal however.  His prognosis is guarded and likely has limited life expectancy.  5.  Anorexia: Prescription for Megace will be sent to him with instructions how to use it.   6.  IV access: Port-A-Cath remains in place and will be flushed periodically.  7. Follow-up: Will be in 2 months to follow his progress.   30  minutes were dedicated to this encounter.  The time was spent on reviewing his disease status, discussing treatment options, reviewing imaging studies and future plan of care review.    Zola Button, MD 11/16/20211:47 PM

## 2020-07-08 ENCOUNTER — Other Ambulatory Visit: Payer: Self-pay | Admitting: Student in an Organized Health Care Education/Training Program

## 2020-07-09 IMAGING — XA IR IMAGING GUIDED PORT INSERTION
1 series · 1 of 1 positions shown · non-contrast
Comparison: none

INDICATION: 83-year-old male with a history of prostate carcinoma referred for
port catheter placement

[Series 300: line placements · 1 of 1 slices shown]
[im 1/1]
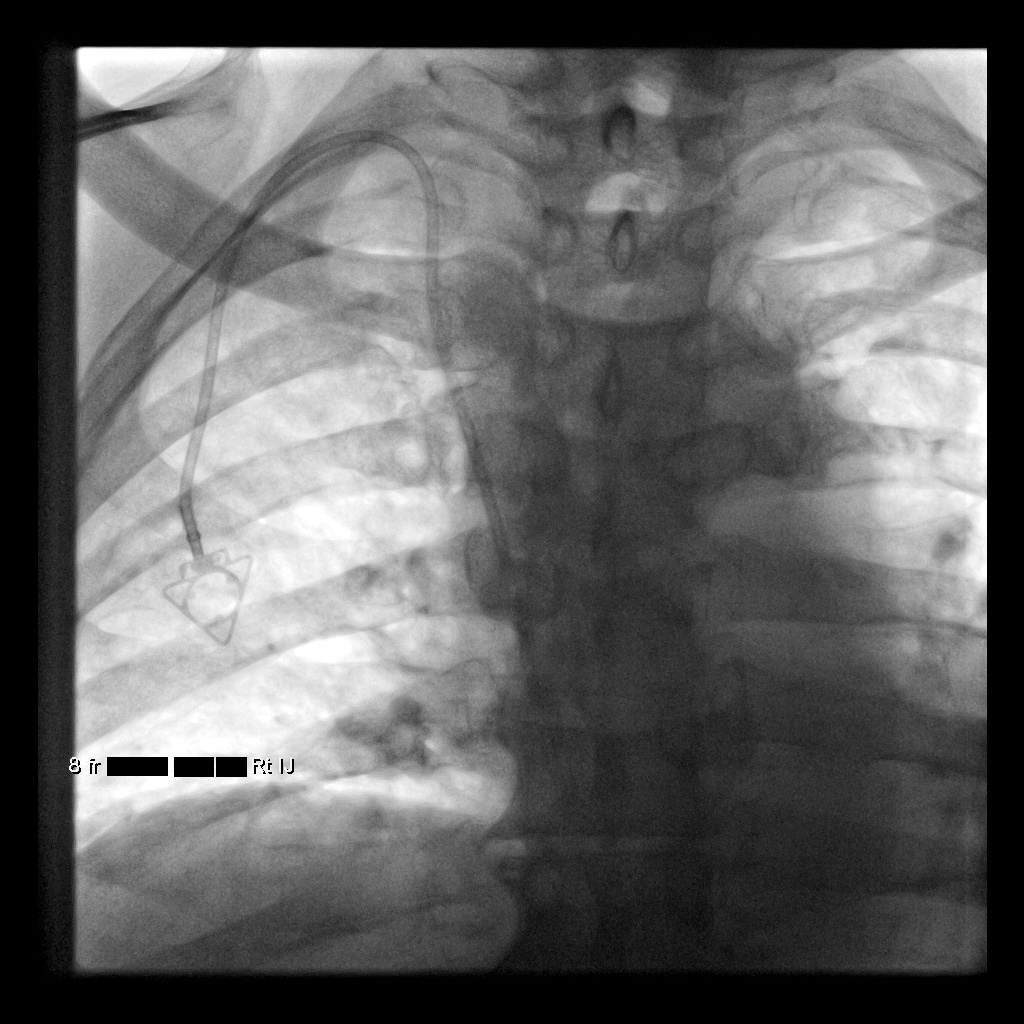

[1 of 1 positions shown; findings below may reference images not displayed]

EXAM:
IMAGE GUIDED PORT CATHETER PLACEMENT

MEDICATIONS:
2 g Ancef; The antibiotic was administered within an appropriate
time interval prior to skin puncture.

ANESTHESIA/SEDATION:
Moderate (conscious) sedation was employed during this procedure. A
total of Versed 2.0 mg and Fentanyl 100 mcg was administered
intravenously.

Moderate Sedation Time: 15 minutes. The patient's level of
consciousness and vital signs were monitored continuously by
radiology nursing throughout the procedure under my direct
supervision.

FLUOROSCOPY TIME:  Fluoroscopy Time: 0 minutes 6 seconds (2 mGy).

COMPLICATIONS:
None

PROCEDURE:
The procedure, risks, benefits, and alternatives were explained to
the patient. Questions regarding the procedure were encouraged and
answered. The patient understands and consents to the procedure.

Ultrasound survey was performed with images stored and sent to PACs.

The right neck and chest was prepped with chlorhexidine, and draped
in the usual sterile fashion using maximum barrier technique (cap
and mask, sterile gown, sterile gloves, large sterile sheet, hand
hygiene and cutaneous antiseptic). Antibiotic prophylaxis was
provided with 2.0g Ancef administered IV one hour prior to skin
incision. Local anesthesia was attained by infiltration with 1%
lidocaine without epinephrine.

Ultrasound demonstrated patency of the right internal jugular vein,
and this was documented with an image. Under real-time ultrasound
guidance, this vein was accessed with a 21 gauge micropuncture
needle and image documentation was performed. A small dermatotomy
was made at the access site with an 11 scalpel. A 0.018" wire was
advanced into the SVC and used to estimate the length of the
internal catheter. The access needle exchanged for a 4F
micropuncture vascular sheath. The 0.018" wire was then removed and
a 0.035" wire advanced into the IVC.



The venous access site was then serially dilated and a peel away
vascular sheath placed over the wire. The wire was removed and the
port catheter advanced into position under fluoroscopic guidance.
The catheter tip is positioned in the cavoatrial junction. This was
documented with a spot image. The portacatheter was then tested and
found to flush and aspirate well. The port was flushed with saline
followed by 100 units/mL heparinized saline.

The pocket was then closed in two layers using first subdermal
inverted interrupted absorbable sutures followed by a running
subcuticular suture. The epidermis was then sealed with Dermabond.
The dermatotomy at the venous access site was also seal with
Dermabond.

Patient tolerated the procedure well and remained hemodynamically
stable throughout.

No complications encountered and no significant blood loss
encountered
IMPRESSION: Status post right IJ port catheter placement.

## 2020-07-15 ENCOUNTER — Encounter: Payer: Self-pay | Admitting: Radiation Oncology

## 2020-07-15 NOTE — Progress Notes (Signed)
GU Location of Tumor / Histology: advanced prostate cancer with disease in the bone and adenopathy diagnosed in 2018.  Referred for consideration of Xofigo.  If Prostate Cancer, Gleason Score is (4 + 4) and PSA is (3.2). Prostate volume: 25  [name] was diagnosed with prostate cancer in 2018. Unfortunately, his psa has continued to rise despite treatment.     Biopsies of prostate (if applicable) revealed:   Past/Anticipated interventions by urology, if any: prostate biopsy, referral to Dr. Alen Blew  Past/Anticipated interventions by medical oncology, if any: Yes.  Prior Therapy: He was treated with androgen deprivation as well as definitive radiation therapy. His PSA nadir was 0.5 in 2009.   He developed advanced disease with PSA in February 2015 was up to 36.35 and a fluoride PET scan showed measurable disease in the second rib as well as the 10th thoracic vertebral body. He was started on androgen deprivation since that time. He had an excellent PSA response initially with a nadir down to 0.08 in April 2016.   His PSA was 0.8 in February 2017 and Casodex was added.   In April 2017 his PSA was 0.28 and started to rise again in July 2017. His PSA was 0.44 and subsequently was 1.09 in October 2017. On 10/09/2016 his PSA was 3.39. His testosterone level was 30.4. Casodex was discontinued in April 2018. His PSA was 7.7 in August 2018.  Xtandi 160 mg daily started on 04/08/2017.  Therapy discontinued in November 2020 for progression of disease.    Taxotere chemotherapy 75 mg per metered square started  on July 13, 2019.  He received a total of 10 cycles after dose reduction to 60 mg per metered square completed on February 28, 2020.   Current therapy:   Under consideration for different salvage therapy.  Androgen deprivation under the care of [facility].   Weight changes, if any: denies  Bowel/Bladder complaints, if any: IPSS 18. Reports intermittent  dysuria. Denies hematuria. Reports urinary leakage requiring him to wear a depends. Denies nausea or vomiting. Struggles with constipation.   Nausea/Vomiting, if any: denies  Pain issues, if any:  Increased chest wall and lower back pain noted by Dr. Alen Blew. Patient denies pain today though.   SAFETY ISSUES:  Prior radiation? yes  Pacemaker/ICD? denies  Possible current pregnancy? no, male patient  Is the patient on methotrexate? denies  Current Complaints / other details:  84 year old male. Married. Former smoker. Endorses taking tamsulosin. Received Lupron 05/31/20.   Referred for consideration of Xofigo.

## 2020-07-16 ENCOUNTER — Other Ambulatory Visit: Payer: Self-pay | Admitting: Urology

## 2020-07-16 ENCOUNTER — Ambulatory Visit
Admission: RE | Admit: 2020-07-16 | Discharge: 2020-07-16 | Disposition: A | Discharge status: Home / Self Care | Payer: Medicare Other | Source: Ambulatory Visit | Attending: Radiation Oncology | Admitting: Radiation Oncology

## 2020-07-16 ENCOUNTER — Encounter: Payer: Self-pay | Admitting: Radiation Oncology

## 2020-07-16 ENCOUNTER — Other Ambulatory Visit: Payer: Self-pay

## 2020-07-16 VITALS — BP 121/67 | HR 99 | Temp 98.2°F | Resp 24 | Ht 70.5 in | Wt 194.6 lb

## 2020-07-16 DIAGNOSIS — K219 Gastro-esophageal reflux disease without esophagitis: Secondary | ICD-10-CM | POA: Insufficient documentation

## 2020-07-16 DIAGNOSIS — Z86010 Personal history of colonic polyps: Secondary | ICD-10-CM | POA: Insufficient documentation

## 2020-07-16 DIAGNOSIS — N62 Hypertrophy of breast: Secondary | ICD-10-CM | POA: Diagnosis not present

## 2020-07-16 DIAGNOSIS — I700 Atherosclerosis of aorta: Secondary | ICD-10-CM | POA: Insufficient documentation

## 2020-07-16 DIAGNOSIS — Z79899 Other long term (current) drug therapy: Secondary | ICD-10-CM | POA: Insufficient documentation

## 2020-07-16 DIAGNOSIS — Z87442 Personal history of urinary calculi: Secondary | ICD-10-CM | POA: Diagnosis not present

## 2020-07-16 DIAGNOSIS — I2510 Atherosclerotic heart disease of native coronary artery without angina pectoris: Secondary | ICD-10-CM | POA: Diagnosis not present

## 2020-07-16 DIAGNOSIS — E7800 Pure hypercholesterolemia, unspecified: Secondary | ICD-10-CM | POA: Diagnosis not present

## 2020-07-16 DIAGNOSIS — I10 Essential (primary) hypertension: Secondary | ICD-10-CM | POA: Diagnosis not present

## 2020-07-16 DIAGNOSIS — C61 Malignant neoplasm of prostate: Secondary | ICD-10-CM | POA: Insufficient documentation

## 2020-07-16 DIAGNOSIS — Z87891 Personal history of nicotine dependence: Secondary | ICD-10-CM | POA: Insufficient documentation

## 2020-07-16 DIAGNOSIS — Z923 Personal history of irradiation: Secondary | ICD-10-CM | POA: Insufficient documentation

## 2020-07-16 DIAGNOSIS — C7951 Secondary malignant neoplasm of bone: Secondary | ICD-10-CM | POA: Diagnosis not present

## 2020-07-16 MED ORDER — OXYCODONE-ACETAMINOPHEN 5-325 MG PO TABS
1.0000 | ORAL_TABLET | ORAL | 0 refills | Status: DC | PRN
Start: 2020-07-16 — End: 2020-09-10

## 2020-07-16 NOTE — Progress Notes (Signed)
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    ICD-10-CM   1. Prostate cancer metastatic to bone ([place])  C61    C79.51   2. Prostate cancer ([place])  C61     HISTORY OF PRESENT ILLNESS: [name] is a 84 y.o. male seen at the request of Dr. Alen Blew. He was initially diagnosed with Gleason 4+4 prostate cancer on 11/01/2007.  PSA at the time of diagnosis was 3.2. He was subsequently treated with a single dose of ADT followed by EBRT with seed boost under the care of Dr. Valere Dross, completed 04/13/2019. His PSA reached its nadir of 0.5 in 04/2008.   Since then, his PSA has steadily risen. By 09/2013, it had reached 36.35. PET scan performed at that time showed bony disease in his left 2nd rib, T2, and T10, as well as slight enlargement of pelvic lymph nodes. He was restarted on ADT with firmagon on 11/01/2013, which he continues on to this day. His PSA initially responded well with a nadir at 0.08 in 11/2014 but began to rise again in 09/2015 at 0.8. At that time, he was started on Casodex. The PSA decreased to 0.28 in 11/2015 but began to gradually rise thereafter, reaching 3.39 in 10/2016 despite castrate levels of testosterone so the Casodex was discontiued The PSA increased to 7.7 in 03/2017, at which time he was referred to Dr. Alen Blew, who started the patient on Xtandi on 03/26/2017.  He underwent restaging scans in 12/2018 that revealed disease progression within the skeleton and pelvic lymph nodes. The [name] was discontinued in 06/2019, and PSA at that time was 54.6. He subsequently began Taxotere therapy in 07/2019, completing 10 cycles on  02/28/2020. His PSA initially decreased down to 28 in 03/2020, but hd increased to 49.3 on 06/20/2020. He underwent restaging studies the same day. CT C/A/P showed development of thoracic and lower cervical adenopathy with relatively stable abdominopelvic adenopathy with some nodes larger while others had regressed. There was widespread sclerotic osseous metastasis, significantly more apparent than 12/2018. Bone scan confirmed progressive osseous metastatic disease.  He has reviewed his recent PSA and scan results with Dr. Alen Blew, and he has kindly been referred to [place] for consideration of Xofigo therapy.  PREVIOUS RADIATION THERAPY: Yes  2009: Prostate EBRT and brachytherapy boost (Dr. Valere Dross)  PAST MEDICAL HISTORY:  Past Medical History:  Diagnosis Date  . Adenomatous colon polyp 12/14/2005   5 mm polyp in descending colon, found on colonoscopy by Dr. Wilford Corner  . Allergic rhinitis   . Asymptomatic cholelithiasis 11/30/2014  . Bladder calculi   . Bladder cancer ([place]) 2010   S/P TURBT   . Bladder neck contracture   . Enlarged heart    "slight" (09/09/2016)  . GERD (gastroesophageal reflux disease)   . Hand eczema   . Hearing loss 11/30/2016  . Hepatitis, unspecified PT STATES HAD HEPATITIS APPROX 1980'S ; UNSURE WHAT TYPE   Hepatitis C RNA Quant on July 06, 2007 showed no detectable virus.  . High cholesterol   . History of kidney stones   . Hypertension   . Nocturia   . OSA on CPAP    Severe  by diagnostic polysomnogram 06/22/2004.  Marland Kitchen Prostate cancer ([place]) 2009   S/P EXTERNAL RADIATION, SEED IMPLANTS  . Self-catheterizes urinary bladder    "q couple weeks prn" (09/09/2016)  . Sleep apnea    "don't use the mask anymore" (09/09/2016)      PAST SURGICAL HISTORY: Past Surgical History:  Procedure Laterality Date  . CARDIOVASCULAR STRESS TEST  07-14-2011  DR NISHAN   NORMAL NUCLEAR STUDY/ EF 62%  . CIRCUMCISION N/A 04/08/2015   Procedure: CIRCUMCISION ADULT;  Surgeon: Franchot Gallo, MD;  Location: [facility];  Service: Urology;  Laterality: N/A;  . CYSTOSCOPY WITH LITHOLAPAXY N/A 10/24/2012   Procedure: CYSTOSCOPY WITH LITHOLAPAXY;  Surgeon: Franchot Gallo, MD;  Location: [facility];  Service: Urology;  Laterality: N/A;  TRANSURETHRAL RESECTION OF BLADDER NECK CONTRACTURE WITH COLLINS KNIFE     . CYSTOSCOPY WITH URETHRAL DILATATION N/A 04/08/2015   Procedure: CYSTOSCOPY WITH BALLOON DILATATION OF BLADDER NECK CONTRACTURE;  Surgeon: Franchot Gallo, MD;  Location: [facility];  Service: Urology;  Laterality: N/A;  . IR IMAGING GUIDED PORT INSERTION  08/16/2019  . KNEE ARTHROSCOPY Bilateral 1970's  . RADIOACTIVE PROSTATE SEED IMPLANTS  04-11-2008  . TRANSURETHRAL RESECTION OF BLADDER NECK  12-19-2008  . TRANSURETHRAL RESECTION OF BLADDER TUMOR  02-19-2009   AND TUR BLADDER NECK    FAMILY HISTORY:  Family History  Problem Relation Age of Onset  . Heart attack Mother 51  . Cancer Neg Hx   . Diabetes Neg Hx   . Breast cancer Neg Hx   . Colon cancer Neg Hx   . Pancreatic cancer Neg Hx   . Prostate cancer Neg Hx     SOCIAL HISTORY:  Social History   Socioeconomic History  . Marital status: Married    Spouse name: Not on file  . Number of children: 6  . Years of education: Not on file  . Highest education level: Not on file  Occupational History  . Not on file  Tobacco Use  . Smoking status: Former Smoker    Packs/day: 0.25    Years: 25.00    Pack years: 6.25    Types: Cigarettes    Quit date: 11/25/2004    Years since quitting: 15.6  . Smokeless tobacco: Never Used  Vaping Use  . Vaping Use: Never used  Substance and Sexual Activity  . Alcohol use: No  . Drug use: No  . Sexual activity: Not Currently  Other Topics Concern  . Not on file  Social History Narrative  . Not on file   Social Determinants of Health   Financial Resource Strain: Not on file  Food Insecurity: Not on file  Transportation Needs: Not on file   Physical Activity: Not on file  Stress: Not on file  Social Connections: Not on file  Intimate Partner Violence: Not on file    ALLERGIES: Aspirin  MEDICATIONS:  Current Outpatient Medications  Medication Sig Dispense Refill  . amLODipine (NORVASC) 10 MG tablet TAKE 1 TABLET (10 MG TOTAL) DAILY BY MOUTH. 90 tablet 3  . cetirizine (ZYRTEC ALLERGY) 10 MG tablet Take 1 tablet (10 mg total) by mouth daily. 30 tablet 2  . enzalutamide (XTANDI) 40 MG capsule Take by mouth.    . fluticasone (FLONASE) 50 MCG/ACT nasal spray Place 1 spray into both nostrils daily. 16 g 2  . Hypromellose (ARTIFICIAL TEARS OP) Place 1 drop into the left eye daily.    . potassium chloride (KLOR-CON) 10 MEQ tablet Take 10 mEq  by mouth daily.    . pravastatin (PRAVACHOL) 20 MG tablet TAKE 1 TABLET BY MOUTH EVERY DAY IN THE EVENING (Patient taking differently: Take 20 mg by mouth daily. ) 90 tablet 3  . senna-docusate (SENNA S) 8.6-50 MG tablet Take 1 tablet by mouth 2 (two) times daily. 60 tablet 3  . megestrol (MEGACE) 400 MG/10ML suspension Take 10 mLs (400 mg total) by mouth 2 (two) times daily. (Patient not taking: Reported on 07/16/2020) 240 mL 0  . oxyCODONE-acetaminophen (PERCOCET/ROXICET) 5-325 MG tablet Take 1 tablet by mouth every 4 (four) hours as needed for severe pain. 30 tablet 0  . prochlorperazine (COMPAZINE) 10 MG tablet Take 1 tablet (10 mg total) by mouth every 6 (six) hours as needed for nausea or vomiting. (Patient not taking: Reported on 07/16/2020) 30 tablet 0  . tamsulosin (FLOMAX) 0.4 MG CAPS capsule Take 0.4 mg by mouth at bedtime. (Patient not taking: Reported on 07/16/2020)     No current facility-administered medications for this encounter.    REVIEW OF SYSTEMS:  On review of systems, the patient reports that he is doing well overall. He denies any chest pain, shortness of breath, cough, fevers, chills, night sweats, unintended weight changes. He denies abdominal pain, nausea or vomiting. His  IPSS score today was 18, indicating moderate urinary symptoms. He reports intermittent dysuria and urinary leakage requiring him to wear pads/Depends. He also reports struggling with constipation. He denies any new musculoskeletal or joint aches or pains but admits to having chronic low back pain into the right hip and lower extremity, occasionally moderate-severe. He ad an episode 2 nights ago where he could not get comfortable in bed due to severe back pain with some numbness in the right shin but this resolved spontaneously and he has not had further paraesthesias or severe pain. A complete review of systems is obtained and is otherwise negative.    PHYSICAL EXAM:  Wt Readings from Last 3 Encounters:  07/16/20 194 lb 9.6 oz (88.3 kg)  06/25/20 201 lb 11.2 oz (91.5 kg)  04/10/20 228 lb 6.4 oz (103.6 kg)   Temp Readings from Last 3 Encounters:  07/16/20 98.2 F (36.8 C)  06/25/20 97.7 F (36.5 C) (Tympanic)  04/10/20 98.8 F (37.1 C) (Tympanic)   BP Readings from Last 3 Encounters:  07/16/20 121/67  06/25/20 125/69  04/10/20 (!) 141/90   Pulse Readings from Last 3 Encounters:  07/16/20 99  06/25/20 100  04/10/20 (!) 58    /10  In general this is a well appearing African American in no acute distress. He is alert and oriented x4 and appropriate throughout the examination. HEENT reveals that the patient is normocephalic, atraumatic. EOMs are intact. PERRLA. Skin is intact without any evidence of gross lesions. Cardiopulmonary assessment is negative for acute distress and he exhibits normal effort. The abdomen is soft, non tender, non distended. Lower extremities are negative for pretibial pitting edema, deep calf tenderness, cyanosis or clubbing.   KPS = 70  100 - Normal; no complaints; no evidence of disease. 90   - Able to carry on normal activity; minor signs or symptoms of disease. 80   - Normal activity with effort; some signs or symptoms of disease. 29   - Cares for self;  unable to carry on normal activity or to do active work. 60   - Requires occasional assistance, but is able to care for most of his personal needs. 50   - Requires considerable assistance and frequent medical  care. 12   - Disabled; requires special care and assistance. 71   - Severely disabled; hospital admission is indicated although death not imminent. 85   - Very sick; hospital admission necessary; active supportive treatment necessary. 10   - Moribund; fatal processes progressing rapidly. 0     - Dead  Karnofsky DA, Abelmann [place], Craver LS and Burchenal [facility] [phone]) The use of the nitrogen mustards in the palliative treatment of carcinoma: with particular reference to bronchogenic carcinoma Cancer 1 634-56  LABORATORY DATA:  Lab Results  Component Value Date   WBC 6.2 06/20/2020   HGB 11.4 (L) 06/20/2020   HCT 35.7 (L) 06/20/2020   MCV 82.4 06/20/2020   PLT 341 06/20/2020   Lab Results  Component Value Date   NA 139 06/20/2020   K 3.8 06/20/2020   CL 107 06/20/2020   CO2 21 (L) 06/20/2020   Lab Results  Component Value Date   ALT 32 06/20/2020   AST 37 06/20/2020   ALKPHOS 125 06/20/2020   BILITOT 0.4 06/20/2020     RADIOGRAPHY: NM Bone Scan Whole Body  Result Date: 06/21/2020 CLINICAL DATA:  Castration resistant prostate cancer, osseous metastases, assess response to treatment EXAM: NUCLEAR MEDICINE WHOLE BODY BONE SCAN TECHNIQUE: Whole body anterior and posterior images were obtained approximately 3 hours after intravenous injection of radiopharmaceutical. RADIOPHARMACEUTICALS:  19.6 mCi Technetium-34m MDP IV COMPARISON:  12/15/2018 Correlation: CT chest abdomen pelvis 06/20/2020 FINDINGS: Numerous sites of abnormal osseous tracer uptake are seen consistent with widespread osseous metastatic disease. These include scapulae, BILATERAL ribs, thoracic spine, lumbar spine, pelvis, BILATERAL proximal humeri, suspect proximal femora bilaterally. Multiple observed sites of metastasis  are new or progressive since the previous exam. Expected urinary tract and soft tissue distribution of tracer. IMPRESSION: Progressive osseous metastatic disease since prior exam. Electronically Signed   By: Lavonia Dana M.D.   On: 06/21/2020 08:10   CT CHEST ABDOMEN PELVIS W CONTRAST  Result Date: 06/20/2020 CLINICAL DATA:  Castrate resistant prostate cancer diagnosed in 2018. Bone metastasis. Most recent chemotherapy completed February 28, 2020. EXAM: CT CHEST, ABDOMEN, AND PELVIS WITH CONTRAST TECHNIQUE: Multidetector CT imaging of the chest, abdomen and pelvis was performed following the standard protocol during bolus administration of intravenous contrast. CONTRAST:  164mL OMNIPAQUE IOHEXOL 300 MG/ML  SOLN COMPARISON:  12/15/2018 abdominopelvic CT. Most recent chest imaging includes a sodium fluoride PET of 10/09/2013. No prior dedicated chest CT. FINDINGS: CT CHEST FINDINGS Cardiovascular: Right Port-A-Cath tip superior caval/atrial junction. Aortic atherosclerosis. Tortuous thoracic aorta. Normal heart size, without pericardial effusion. Multivessel coronary artery atherosclerosis. No central pulmonary embolism, on this non-dedicated study. Mediastinum/Nodes: New right low jugular/supraclavicular adenopathy 1.3 cm on 03/02. 9 mm right paratracheal node on 12/02 enlarged from 3 mm on the prior PET. Node within the subcarinal station with extension into the azygoesophageal recess measures 1.4 cm on 31/2 versus 6 mm in 2015. No hilar adenopathy. Lungs/Pleura: Mild bilateral pleural thickening. No pleural fluid. Moderate centrilobular emphysema. Anterior left lower lobe subpleural 5 mm pulmonary nodule on 76/4, similar to 10/09/2013 and considered benign. Musculoskeletal: Diffuse sclerotic metastasis, significantly progressive compared to 10/09/2013. Moderate bilateral gynecomastia, worse on the left. CT ABDOMEN PELVIS FINDINGS Hepatobiliary: Cirrhosis, as evidenced by caudate lobe enlargement, medial left  hepatic lobe atrophy, and irregular hepatic capsule. Normal gallbladder, without biliary ductal dilatation. Pancreas: Normal, without mass or ductal dilatation. Spleen: Normal in size, without focal abnormality. Adrenals/Urinary Tract: Mild left adrenal thickening. Normal right adrenal gland. Too small to characterize right  renal lesions. Normal left kidney, without hydronephrosis. Normal urinary bladder. Stomach/Bowel: Normal stomach, without wall thickening. Scattered colonic diverticula. Normal terminal ileum and appendix. Normal small bowel. Vascular/Lymphatic: Aortic atherosclerosis. Index left periaortic node measures 1.1 cm on 75/2 versus 8 mm on the prior exam (when remeasured). Index retrocaval node measures 1.0 cm on 67/2 and is similar to on the prior. A pre caval node measures 1.0 cm on 82/2 versus 1.6 cm on the prior exam (when remeasured). Small bowel mesenteric nodes at the level of the aortic bifurcation measure up to 1.2 cm on 84/2 today versus 6 mm on the prior exam (when remeasured). A left pelvic node measures 1.0 cm on 93/2 and is similar to on the prior. Reproductive: Radiation seeds within the prostate. Other: No significant free fluid. Musculoskeletal: Sclerotic osseous metastasis are relatively diffuse, markedly progressive compared to 12/15/2018. Index right iliac lesion measures 4.1 cm on 87/2 and is new or significantly more distinct today. S-shaped thoracolumbar spine curvature. IMPRESSION: CT CHEST IMPRESSION 1. Development of thoracic and lower cervical adenopathy compared to the most recent chest imaging of 10/09/2013. 2. Aortic atherosclerosis (ICD10-I70.0), coronary artery atherosclerosis and emphysema (ICD10-J43.9). 3. Bilateral gynecomastia. CT ABDOMEN AND PELVIS IMPRESSION 1. Compared to the most recent abdominopelvic imaging of 12/15/2018, relatively similar adenopathy. Some nodes measure larger while others have regressed. 2. Widespread sclerotic osseous metastasis,  significantly more apparent than 12/15/2018. This could represent progressive osseous metastasis or healing of previously occult metastasis. 3. Cirrhosis. 4.  Aortic Atherosclerosis (ICD10-I70.0). Electronically Signed   By: Abigail Miyamoto M.D.   On: 06/20/2020 13:31      IMPRESSION/PLAN: 1. 84 y.o. 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 Today, we talked to the patient and his daughter about the findings and workup thus far. We discussed the natural history of metastatic prostate cancer and general treatment, highlighting the role of Xogifo infusions in the management of progressive bony disease. We focused on the details and logistics of delivery. The recommendation is to proceed with monthly infusions of Xofigo x6. We will monitor labs prior to each infusion to ensure it is safe to proceed with each treatment. We reviewed the anticipated acute and late sequelae associated with Xofigo in this setting. The patient was encouraged to ask questions that were answered to his stated satisfaction.  At the end of our conversation, the patient elects to proceed with Xofigo infusions. We will share this information with Dr. Alen Blew and Dr. Diona Fanti and will proceed with treatment planning accordingly, in anticipate of beginning treatment in the near future.    Nicholos Johns, PA-C    Tyler Pita, MD  [facility] Direct Dial: [phone]  Fax: [phone] [email or web address]  Skype  [email or web address]   This document serves as a record of services personally performed by Tyler Pita, MD and Freeman Caldron, PA-C. It was created on their behalf by Wilburn Mylar, a trained medical scribe. The creation of this record is based on the scribe's personal observations and the provider's statements to them. This document has been checked and approved by the attending provider.

## 2020-07-16 NOTE — Progress Notes (Signed)
  Radiation Oncology         [phone] ________________________________  Name: [name] MRN: [id number]  Date: 07/16/2020  DOB: [birth year]  Xofigo Treatment Planning Note:  Diagnosis:  Castration resistant prostate cancer with painful bone involvement  Narrative: [name] is a patient who has been diagnosed with castration resistant prostate cancer with painful bone involvement.  His most recent blood counts show that he remains a good candidate to proceed with Ra-223.  The patient is going to receive Xofigo for his treatment.   Radiation Treatment Planning:  The prescribed radiation activity will be 50 kBq per kg per infusions. The plan is to offer a total of 6 IV administrations of this agent, assuming the blood counts are adequate prior to each administration, with each infusion done at 4 week intervals.  This will be done as an IV administration in the nuclear medicine department, with care to undertake all radiation protection precautions as recommended.   ________________________________  Sheral Apley. Tammi Klippel, M.D.

## 2020-07-22 ENCOUNTER — Other Ambulatory Visit: Payer: Self-pay | Admitting: Student in an Organized Health Care Education/Training Program

## 2020-07-24 ENCOUNTER — Other Ambulatory Visit (HOSPITAL_COMMUNITY): Payer: Self-pay | Admitting: Radiation Oncology

## 2020-07-24 DIAGNOSIS — C7951 Secondary malignant neoplasm of bone: Secondary | ICD-10-CM

## 2020-07-26 ENCOUNTER — Other Ambulatory Visit (HOSPITAL_COMMUNITY): Payer: Self-pay | Admitting: Radiation Oncology

## 2020-07-26 ENCOUNTER — Telehealth: Payer: Self-pay | Admitting: *Deleted

## 2020-07-26 DIAGNOSIS — C7951 Secondary malignant neoplasm of bone: Secondary | ICD-10-CM

## 2020-07-26 NOTE — Telephone Encounter (Signed)
CALLED PATIENT TO INFORM OF LABS AND WEIGHT FOR 07-30-20 @ 12 PM @ [place]. FOR 08-06-20 - ARRIVAL TIME- 12:15 PM @ [facility], LVM FOR A RETURN CALL

## 2020-07-26 NOTE — Telephone Encounter (Signed)
CALLED PATIENT TO INFORM OF LAB AND WEIGHT FOR 07-30-20- ARRIVAL TIME - 11:45 AM @ [place]. ON 08-06-20- ARRIVAL TIME- - ARRIVAL TIME- 12:15 PM @ [facility], SPOKE WITH PATIENT'S WIFE (TINA) AND SHE IS AWARE OF THESE APPTS.

## 2020-07-29 ENCOUNTER — Telehealth: Payer: Self-pay | Admitting: *Deleted

## 2020-07-29 NOTE — Telephone Encounter (Signed)
CALLED PATIENT'S DAUGHTER- TINISHA SNEAD TO REMIND OF LAB AND WEIGHT FOR 07-30-20, LVM FOR A RETURN CALL

## 2020-07-30 ENCOUNTER — Ambulatory Visit
Admission: RE | Admit: 2020-07-30 | Discharge: 2020-07-30 | Disposition: A | Discharge status: Home / Self Care | Payer: Medicare Other | Source: Ambulatory Visit | Attending: Radiation Oncology | Admitting: Radiation Oncology

## 2020-07-30 ENCOUNTER — Other Ambulatory Visit: Payer: Self-pay | Admitting: Radiation Oncology

## 2020-07-30 ENCOUNTER — Other Ambulatory Visit: Payer: Self-pay

## 2020-07-30 DIAGNOSIS — C61 Malignant neoplasm of prostate: Secondary | ICD-10-CM | POA: Diagnosis not present

## 2020-07-30 DIAGNOSIS — C7951 Secondary malignant neoplasm of bone: Secondary | ICD-10-CM | POA: Insufficient documentation

## 2020-07-30 LAB — CBC WITH DIFFERENTIAL (CANCER CENTER ONLY)
Abs Immature Granulocytes: 0.03 10*3/uL (ref 0.00–0.07)
Basophils Absolute: 0 10*3/uL (ref 0.0–0.1)
Basophils Relative: 0 %
Eosinophils Absolute: 0.1 10*3/uL (ref 0.0–0.5)
Eosinophils Relative: 1 %
HCT: 31.2 % — ABNORMAL LOW (ref 39.0–52.0)
Hemoglobin: 9.6 g/dL — ABNORMAL LOW (ref 13.0–17.0)
Immature Granulocytes: 1 %
Lymphocytes Relative: 41 %
Lymphs Abs: 2.5 10*3/uL (ref 0.7–4.0)
MCH: 24.9 pg — ABNORMAL LOW (ref 26.0–34.0)
MCHC: 30.8 g/dL (ref 30.0–36.0)
MCV: 81 fL (ref 80.0–100.0)
Monocytes Absolute: 0.5 10*3/uL (ref 0.1–1.0)
Monocytes Relative: 9 %
Neutro Abs: 2.9 10*3/uL (ref 1.7–7.7)
Neutrophils Relative %: 48 %
Platelet Count: 339 10*3/uL (ref 150–400)
RBC: 3.85 MIL/uL — ABNORMAL LOW (ref 4.22–5.81)
RDW: 19.4 % — ABNORMAL HIGH (ref 11.5–15.5)
WBC Count: 6 10*3/uL (ref 4.0–10.5)
nRBC: 0 % (ref 0.0–0.2)

## 2020-07-30 NOTE — Progress Notes (Signed)
Orders only encounter

## 2020-07-31 ENCOUNTER — Telehealth: Payer: Self-pay | Admitting: *Deleted

## 2020-07-31 MED ORDER — POTASSIUM CHLORIDE ER 10 MEQ PO TBCR: 10.0000 meq | EXTENDED_RELEASE_TABLET | Freq: Every day | ORAL | 1 refills | Status: AC

## 2020-07-31 NOTE — Telephone Encounter (Signed)
[place] risk issue.

## 2020-07-31 NOTE — Telephone Encounter (Signed)
Patient's daughter called in regarding denial of potassium. States he has been on this a long time but has been out x 2 weeks. Last K+ 3.8 on 06/20/2020. Last OV 01/29/2020 with PCP. Please advise. Hubbard Hartshorn, BSN, RN-BC

## 2020-07-31 NOTE — Telephone Encounter (Signed)
Returned call to patient daughter. No answer. Left message on VM that Rx has been sent to [facility]. L. Audray Rumore, BSN, RN-BC

## 2020-08-05 ENCOUNTER — Other Ambulatory Visit: Payer: Self-pay

## 2020-08-05 ENCOUNTER — Telehealth: Payer: Self-pay | Admitting: *Deleted

## 2020-08-05 ENCOUNTER — Ambulatory Visit
Admission: RE | Admit: 2020-08-05 | Discharge: 2020-08-05 | Disposition: A | Discharge status: Home / Self Care | Payer: Medicare Other | Source: Ambulatory Visit | Attending: Radiation Oncology | Admitting: Radiation Oncology

## 2020-08-05 ENCOUNTER — Other Ambulatory Visit: Payer: Self-pay | Admitting: Radiation Oncology

## 2020-08-05 ENCOUNTER — Other Ambulatory Visit: Payer: Self-pay | Admitting: Urology

## 2020-08-05 ENCOUNTER — Telehealth: Payer: Self-pay | Admitting: Radiation Oncology

## 2020-08-05 DIAGNOSIS — C7951 Secondary malignant neoplasm of bone: Secondary | ICD-10-CM

## 2020-08-05 DIAGNOSIS — C7952 Secondary malignant neoplasm of bone marrow: Secondary | ICD-10-CM

## 2020-08-05 LAB — CBC WITH DIFFERENTIAL (CANCER CENTER ONLY)
Abs Immature Granulocytes: 0.02 10*3/uL (ref 0.00–0.07)
Basophils Absolute: 0 10*3/uL (ref 0.0–0.1)
Basophils Relative: 0 %
Eosinophils Absolute: 0 10*3/uL (ref 0.0–0.5)
Eosinophils Relative: 0 %
HCT: 34.2 % — ABNORMAL LOW (ref 39.0–52.0)
Hemoglobin: 10.7 g/dL — ABNORMAL LOW (ref 13.0–17.0)
Immature Granulocytes: 0 %
Lymphocytes Relative: 42 %
Lymphs Abs: 2.9 10*3/uL (ref 0.7–4.0)
MCH: 25.4 pg — ABNORMAL LOW (ref 26.0–34.0)
MCHC: 31.3 g/dL (ref 30.0–36.0)
MCV: 81.2 fL (ref 80.0–100.0)
Monocytes Absolute: 0.7 10*3/uL (ref 0.1–1.0)
Monocytes Relative: 10 %
Neutro Abs: 3.3 10*3/uL (ref 1.7–7.7)
Neutrophils Relative %: 48 %
Platelet Count: 392 10*3/uL (ref 150–400)
RBC: 4.21 MIL/uL — ABNORMAL LOW (ref 4.22–5.81)
RDW: 20.3 % — ABNORMAL HIGH (ref 11.5–15.5)
WBC Count: 7 10*3/uL (ref 4.0–10.5)
nRBC: 0 % (ref 0.0–0.2)

## 2020-08-05 NOTE — Telephone Encounter (Signed)
CALLED PATIENT TO ASK ABOUT COMING IN FOR LAB TODAY, SPOKE WITH PATIENT'S WIFE - TINA AND SHE AGREED TO BRING HIM @ 4 PM TODAY

## 2020-08-05 NOTE — Telephone Encounter (Signed)
Called patient to remind of Xofigo Inj. for 08-06-20- arrival time- 12:15 pm @ [facility], lvm for a return call

## 2020-08-05 NOTE — Telephone Encounter (Signed)
Opened in error

## 2020-08-06 ENCOUNTER — Other Ambulatory Visit: Payer: Self-pay

## 2020-08-06 ENCOUNTER — Ambulatory Visit (HOSPITAL_COMMUNITY)
Admission: RE | Admit: 2020-08-06 | Discharge: 2020-08-06 | Disposition: A | Discharge status: Home / Self Care | Payer: Medicare Other | Source: Ambulatory Visit | Attending: Radiation Oncology | Admitting: Radiation Oncology

## 2020-08-06 DIAGNOSIS — C7951 Secondary malignant neoplasm of bone: Secondary | ICD-10-CM | POA: Insufficient documentation

## 2020-08-06 DIAGNOSIS — C61 Malignant neoplasm of prostate: Secondary | ICD-10-CM | POA: Insufficient documentation

## 2020-08-06 DIAGNOSIS — G893 Neoplasm related pain (acute) (chronic): Secondary | ICD-10-CM | POA: Diagnosis not present

## 2020-08-06 MED ORDER — RADIUM RA 223 DICHLORIDE 30 MCCI/ML IV SOLN
123.3000 | Freq: Once | INTRAVENOUS | Status: AC | PRN
  Administered 2020-08-06: 13:00:00 123.3 via INTRAVENOUS

## 2020-08-06 NOTE — Progress Notes (Signed)
  Radiation Oncology         [phone] ________________________________  Name: [name] MRN: [id number]  Date: 08/06/2020  DOB: [birth year]  Radium-223 Infusion Note  Diagnosis:  Castration resistant prostate cancer with painful bone involvement  Current Infusion:    1  Planned Infusions:  6  Narrative: [name] presented to nuclear medicine for treatment. His most recent blood counts were reviewed.  He remains a good candidate to proceed with Ra-223.  The patient was situated in an infusion suite with a contact barrier placed under his arm. Intravenous access was established, using sterile technique, and a normal saline infusion from a syringe was started.  Micro-dosimetry:  The prescribed radiation activity was assayed and confirmed to be within specified tolerance.  Special Treatment Procedure - Infusion:  The nuclear medicine technologist and I personally verified the dose activity to be delivered as specified in the written directive, and verified the patient identification via 2 separate methods.  The syringe containing the dose was attached to an intravenous access and the dose delivered over a minute. No complications were noted.  The total administered dose was 126.3 microcuries.   A saline flush of the line and the syringe that contained the isotope was then performed.  The residual radioactivity in the syringe was 3.0 microcuries, so the actual infused isotope activity was 123.3 microcuries.   Pressure was applied to the venipuncture site, and a compression bandage placed.   Radiation Safety personnel were present to perform the discharge survey, as detailed on their documentation.   After a short period of observation, the patient had his IV removed.  Impression:  The patient tolerated his infusion relatively well.  Plan:  The patient will return in one month for ongoing care.    ________________________________  Artist Pais. Kathrynn Running, M.D.

## 2020-08-12 ENCOUNTER — Telehealth: Payer: Self-pay | Admitting: Radiation Oncology

## 2020-08-12 NOTE — Telephone Encounter (Signed)
-----   Message from Lolita Cram sent at 08/12/2020  9:56 AM EST ----- Regarding: XOFIGO INJ. Hi Sam,   I received a phone call from this patient's wife- Inetta Fermo, and she said that her husband has been having pain in his stomach and back and wants to know if this is normal? Ms. Wetherbee says that she also has been having to help him get up.  Ms. Eastridge' phone number is [phone].  Thanks,  [employer]

## 2020-08-12 NOTE — Telephone Encounter (Signed)
Received message from [name] that patient's wife, [name], request a return call. Phoned her to inquire. [name] reports that her husband had some low back, groin, and arm pain that has resolved. She questions if this could be related to xofigo. Explained the pain he had is unlikely related to the xofigo injection. She reports he has been weak for sometime but seems better today. Explained her husband's hemoglobin has been running on the boarder of low most likely contributing to his fatigue and weakness. Explained this will most likely improve over the next two weeks. Went onto explain her husband will receive five more treatments four weeks apart so long as his lab works remain within normal limits. Encouraged her to phone this RN back should his pain return or he become weaker. She verbalized understanding and appreciation for the call back.

## 2020-08-13 ENCOUNTER — Other Ambulatory Visit: Payer: Medicare Other

## 2020-08-15 ENCOUNTER — Other Ambulatory Visit (HOSPITAL_COMMUNITY): Payer: Self-pay | Admitting: Radiation Oncology

## 2020-08-15 ENCOUNTER — Telehealth: Payer: Self-pay | Admitting: *Deleted

## 2020-08-15 DIAGNOSIS — C61 Malignant neoplasm of prostate: Secondary | ICD-10-CM

## 2020-08-15 NOTE — Telephone Encounter (Signed)
CALLED PATIENT TO INFORM OF LABS AND WEIGHT ON 08-30-20 - ARRIVAL TIME- 12 PM @ [facility] AND HIS XOFIGO INJ. ON 09-06-20 - ARRIVAL TIME- 11:45 AM @ [facility], LVM FOR A RETURN CALL

## 2020-08-21 ENCOUNTER — Ambulatory Visit: Payer: Medicare Other | Admitting: Oncology

## 2020-08-29 ENCOUNTER — Telehealth: Payer: Self-pay | Admitting: *Deleted

## 2020-08-29 NOTE — Telephone Encounter (Signed)
Called patient to remind of lab and weight appt. for 08-30-20 @ 12:15 pm @ [place], they are aware  of this appt. but have chosen to decline due to insurance

## 2020-08-30 ENCOUNTER — Ambulatory Visit: Payer: Medicare HMO

## 2020-09-03 ENCOUNTER — Telehealth: Payer: Self-pay | Admitting: *Deleted

## 2020-09-03 NOTE — Telephone Encounter (Signed)
XXXX 

## 2020-09-03 NOTE — Telephone Encounter (Signed)
CALLED PATIENT TO INFORM OF LABS AND WEIGHT ON 09-06-20 @ 12 PM @ [place]. ON 09-13-20 - ARRIVAL TIME- 11:45 AM @ [facility], SPOKE WITH PATIENT'S WIFE AND SHE IS AWARE OF THESE APPTS.

## 2020-09-05 ENCOUNTER — Telehealth: Payer: Self-pay | Admitting: *Deleted

## 2020-09-05 NOTE — Telephone Encounter (Signed)
CALLED PATIENT TO INFORM THAT INSURANCE WILL PAY FOR HIS LAB ON 09/06/20, ALSO REMINDER GIVEN FOR LAB AND WEIGHT ON 09-06-20 @ 12 PM @ [place], L VM FOR A RETURN CALL

## 2020-09-06 ENCOUNTER — Other Ambulatory Visit: Payer: Self-pay

## 2020-09-06 ENCOUNTER — Ambulatory Visit (HOSPITAL_COMMUNITY): Payer: Medicare HMO

## 2020-09-06 ENCOUNTER — Other Ambulatory Visit: Payer: Self-pay | Admitting: Radiation Oncology

## 2020-09-06 ENCOUNTER — Ambulatory Visit
Admission: RE | Admit: 2020-09-06 | Discharge: 2020-09-06 | Disposition: A | Discharge status: Home / Self Care | Payer: Medicare HMO | Source: Ambulatory Visit | Attending: Radiation Oncology | Admitting: Radiation Oncology

## 2020-09-06 DIAGNOSIS — C61 Malignant neoplasm of prostate: Secondary | ICD-10-CM | POA: Diagnosis not present

## 2020-09-06 DIAGNOSIS — C7952 Secondary malignant neoplasm of bone marrow: Secondary | ICD-10-CM | POA: Diagnosis not present

## 2020-09-06 DIAGNOSIS — C7951 Secondary malignant neoplasm of bone: Secondary | ICD-10-CM | POA: Insufficient documentation

## 2020-09-06 LAB — CBC WITH DIFFERENTIAL (CANCER CENTER ONLY)
Abs Immature Granulocytes: 0.05 10*3/uL (ref 0.00–0.07)
Basophils Absolute: 0 10*3/uL (ref 0.0–0.1)
Basophils Relative: 0 %
Eosinophils Absolute: 0 10*3/uL (ref 0.0–0.5)
Eosinophils Relative: 0 %
HCT: 34.7 % — ABNORMAL LOW (ref 39.0–52.0)
Hemoglobin: 10.5 g/dL — ABNORMAL LOW (ref 13.0–17.0)
Immature Granulocytes: 1 %
Lymphocytes Relative: 24 %
Lymphs Abs: 1.6 10*3/uL (ref 0.7–4.0)
MCH: 26 pg (ref 26.0–34.0)
MCHC: 30.3 g/dL (ref 30.0–36.0)
MCV: 85.9 fL (ref 80.0–100.0)
Monocytes Absolute: 0.8 10*3/uL (ref 0.1–1.0)
Monocytes Relative: 12 %
Neutro Abs: 4.3 10*3/uL (ref 1.7–7.7)
Neutrophils Relative %: 63 %
Platelet Count: 319 10*3/uL (ref 150–400)
RBC: 4.04 MIL/uL — ABNORMAL LOW (ref 4.22–5.81)
RDW: 19.4 % — ABNORMAL HIGH (ref 11.5–15.5)
WBC Count: 6.8 10*3/uL (ref 4.0–10.5)
nRBC: 0 % (ref 0.0–0.2)

## 2020-09-06 LAB — CMP (CANCER CENTER ONLY)
ALT: 11 U/L (ref 0–44)
AST: 22 U/L (ref 15–41)
Albumin: 2.7 g/dL — ABNORMAL LOW (ref 3.5–5.0)
Alkaline Phosphatase: 114 U/L (ref 38–126)
Anion gap: 10 (ref 5–15)
BUN: 7 mg/dL — ABNORMAL LOW (ref 8–23)
CO2: 20 mmol/L — ABNORMAL LOW (ref 22–32)
Calcium: 9.2 mg/dL (ref 8.9–10.3)
Chloride: 105 mmol/L (ref 98–111)
Creatinine: 0.75 mg/dL (ref 0.61–1.24)
GFR, Estimated: >60 mL/min (ref >60)
Glucose, Bld: 114 mg/dL — ABNORMAL HIGH (ref 70–99)
Potassium: 4.2 mmol/L (ref 3.5–5.1)
Sodium: 135 mmol/L (ref 135–145)
Total Bilirubin: 0.6 mg/dL (ref 0.3–1.2)
Total Protein: 8.6 g/dL — ABNORMAL HIGH (ref 6.5–8.1)

## 2020-09-07 LAB — PROSTATE-SPECIFIC AG, SERUM (LABCORP): Prostate Specific Ag, Serum: 95.8 ng/mL — ABNORMAL HIGH (ref 0.0–4.0)

## 2020-09-10 ENCOUNTER — Telehealth: Payer: Self-pay | Admitting: *Deleted

## 2020-09-10 ENCOUNTER — Telehealth: Payer: Self-pay | Admitting: Radiation Oncology

## 2020-09-10 ENCOUNTER — Other Ambulatory Visit: Payer: Self-pay | Admitting: Urology

## 2020-09-10 MED ORDER — OXYCODONE-ACETAMINOPHEN 5-325 MG PO TABS: 1.0000 | ORAL_TABLET | ORAL | 0 refills | Status: AC | PRN

## 2020-09-10 NOTE — Telephone Encounter (Signed)
Per Romie Jumper patient's wife, Otila Kluver, called requesting a refill of her husband's percocet. She requested the script be [facility] to [facility]. Noted this medication was last prescribed by Freeman Caldron, PA-C on 07/16/20 qty 30. Patient scheduled for second Xofigo injection on Friday, September 13, 2020. Wife understands this RN will phone back with directions after consulting with [place].

## 2020-09-10 NOTE — Telephone Encounter (Signed)
After several attempts this RN was finally able to reach patient's daughter, Juliette Mangle, on her father's mobile phone. Explained the percocet refill her mother requested earlier today has [facility]. Explained that per Freeman Caldron, PA-C future refills will need to come from Dr. Alen Blew. Tinisha verbalized understanding and appreciation for the callback and assistance.

## 2020-09-10 NOTE — Telephone Encounter (Signed)
Returned patient's phone call, lvm for a return call 

## 2020-09-11 DIAGNOSIS — C61 Malignant neoplasm of prostate: Secondary | ICD-10-CM | POA: Diagnosis not present

## 2020-09-11 DIAGNOSIS — C7951 Secondary malignant neoplasm of bone: Secondary | ICD-10-CM | POA: Diagnosis not present

## 2020-09-11 DIAGNOSIS — C778 Secondary and unspecified malignant neoplasm of lymph nodes of multiple regions: Secondary | ICD-10-CM | POA: Diagnosis not present

## 2020-09-12 ENCOUNTER — Telehealth: Payer: Self-pay | Admitting: *Deleted

## 2020-09-12 NOTE — Telephone Encounter (Signed)
CALLED PATIENT TO REMIND OF XOFIGO INJ. FOR 09-13-20- ARRIVAL TIME- 11:45 AM @ [facility], SPOKE WITH PATIENT'S WIFE AND SHE IS AWARE OF THIS INJ.

## 2020-09-13 ENCOUNTER — Ambulatory Visit (HOSPITAL_COMMUNITY)
Admission: RE | Admit: 2020-09-13 | Discharge: 2020-09-13 | Disposition: A | Discharge status: Home / Self Care | Payer: Medicare HMO | Source: Ambulatory Visit | Attending: Radiation Oncology | Admitting: Radiation Oncology

## 2020-09-13 ENCOUNTER — Other Ambulatory Visit: Payer: Self-pay

## 2020-09-13 DIAGNOSIS — C61 Malignant neoplasm of prostate: Secondary | ICD-10-CM | POA: Diagnosis not present

## 2020-09-13 DIAGNOSIS — G893 Neoplasm related pain (acute) (chronic): Secondary | ICD-10-CM | POA: Diagnosis not present

## 2020-09-13 DIAGNOSIS — C7951 Secondary malignant neoplasm of bone: Secondary | ICD-10-CM | POA: Diagnosis not present

## 2020-09-13 MED ORDER — RADIUM RA 223 DICHLORIDE 30 MCCI/ML IV SOLN
122.8000 | Freq: Once | INTRAVENOUS | Status: AC | PRN
  Administered 2020-09-13: 122.8 via INTRAVENOUS

## 2020-09-16 NOTE — Progress Notes (Addendum)
Corrected Current Infusion Number:   Radiation Oncology         [phone] ________________________________  Name: AKIA DESROCHES MRN: [id number]  Date: 09/13/2020  DOB: 04-13-1936  Radium-223 Infusion Note  Diagnosis:  Castration resistant prostate cancer with painful bone involvement  Current Infusion:    2  Planned Infusions:  6  Narrative: Mr. SHALON COUNCILMAN presented to nuclear medicine for treatment. His most recent blood counts were reviewed.  He remains a good candidate to proceed with Ra-223.  The patient was situated in an infusion suite with a contact barrier placed under his arm. Intravenous access was established, using sterile technique, and a normal saline infusion from a syringe was started.  Micro-dosimetry:  The prescribed radiation activity was assayed and confirmed to be within specified tolerance.  Special Treatment Procedure - Infusion:  The nuclear medicine technologist and I personally verified the dose activity to be delivered as specified in the written directive, and verified the patient identification via 2 separate methods.  The syringe containing the dose was attached to an intravenous access and the dose delivered over a minute. No complications were noted.  The total administered dose was 125.8 microcuries.   A saline flush of the line and the syringe that contained the isotope was then performed.  The residual radioactivity in the syringe was 3.0 microcuries, so the actual infused isotope activity was 122.8 microcuries.   Pressure was applied to the venipuncture site, and a compression bandage placed.   Radiation Safety personnel were present to perform the discharge survey, as detailed on their documentation.   After a short period of observation, the patient had his IV removed.  Impression:  The patient tolerated his infusion relatively well.  Plan:  The patient will return in one month for ongoing care.    ________________________________  Sheral Apley. Tammi Klippel, M.D.

## 2020-09-17 ENCOUNTER — Other Ambulatory Visit (HOSPITAL_COMMUNITY): Payer: Self-pay | Admitting: Radiation Oncology

## 2020-09-17 ENCOUNTER — Telehealth: Payer: Self-pay | Admitting: *Deleted

## 2020-09-17 DIAGNOSIS — C61 Malignant neoplasm of prostate: Secondary | ICD-10-CM

## 2020-09-17 NOTE — Telephone Encounter (Signed)
CALLED PATIENT TO INFORM OF LAB AND WEIGHT ON 10-08-20 - @ 12 PM @ [place]. ON 10-15-20 - ARRIVAL TIME- 11:45  AM @ [facility], SPOKE WITH PATIENT'S WIFE TINA AND SHE IS AWARE OF THESE APPTS

## 2020-09-18 ENCOUNTER — Telehealth: Payer: Self-pay

## 2020-09-18 NOTE — Telephone Encounter (Signed)
Spoke with patient's daughter Juliette Mangle and scheduled an in-person Palliative Consult for 10/07/20 @ 11AM  COVID screening was negative. Two small dogs in the home that are in a crate. Patient lives with wife, daughter, SIL and grandkids.  Consent obtained; updated Outlook/Netsmart/Team List and Epic.  Family is aware they will be receiving a call from NP the day before or day of to confirm appointment.

## 2020-09-19 ENCOUNTER — Telehealth: Payer: Self-pay

## 2020-09-19 NOTE — Telephone Encounter (Signed)
Called pt's daughter, Juliette Mangle, to inform of video appt for her dad  via My Chart tomorrow @ [phone].

## 2020-09-19 NOTE — Telephone Encounter (Signed)
Pt  Daughter is requesting a call back about her dad foot

## 2020-09-19 NOTE — Telephone Encounter (Signed)
Return call to pt's daughter, [name], - states pt has a dark area on bottom of his right foot. Pt's wife states a bruise area is going up his leg also. They think it has been there for about a week. Inform it would be best to schedule an appt so the doctor can look at his leg. She states it's very hard ,almost impossible, to move/tranport pt d/t weakness and pain. I suggested a video appt - she states this will be better; they have I phones and I pads. I will ask [place] to arrange.

## 2020-09-19 NOTE — Progress Notes (Signed)
  [name] Telehealth Video Encounter Continuity Care Appointment  HPI:   This telephone encounter was created for [name] on 09/20/2020 for the following purpose/cc pressure injuries.   Past Medical History:  Past Medical History:  Diagnosis Date  . Adenomatous colon polyp 12/14/2005   5 mm polyp in descending colon, found on colonoscopy by Dr. Wilford [name]  . Allergic rhinitis   . Asymptomatic cholelithiasis 11/30/2014  . Bladder calculi   . Bladder cancer ([name]) 2010   S/P TURBT   . Bladder neck contracture   . Enlarged heart    "slight" (09/09/2016)  . GERD (gastroesophageal reflux disease)   . Hand eczema   . Hearing loss 11/30/2016  . Hepatitis, unspecified PT STATES HAD HEPATITIS APPROX 1980'S ; UNSURE WHAT TYPE   Hepatitis C RNA Quant on July 06, 2007 showed no detectable virus.  . High cholesterol   . History of kidney stones   . Hypertension   . Nocturia   . OSA on CPAP    Severe by diagnostic polysomnogram 06/22/2004.  Marland Kitchen Prostate cancer ([name]) 2009   S/P EXTERNAL RADIATION, SEED IMPLANTS  . Self-catheterizes urinary bladder    "q couple weeks prn" (09/09/2016)  . Sleep apnea    "don't use the mask anymore" (09/09/2016)      ROS:  Review of Systems  Constitutional: Negative for chills and fever.  Musculoskeletal: Positive for myalgias.  Skin:       Bruises on bilateral heels  Neurological: Negative for dizziness and loss of consciousness.  All other systems reviewed and are negative.      Assessment / Plan / Recommendations:   Please see A&P under problem oriented charting for assessment of the patient's acute and chronic medical conditions.   As always, pt is advised that if symptoms worsen or new symptoms arise, they should go to an urgent care facility or to to ER for further evaluation.   Consent and Medical Decision Making:   Patient discussed with Dr. Heber [name]  This is a telephone encounter between  [name] and [name] on 09/20/2020 for pressure injuries. The visit was conducted with the patient located at home and [name] at [name]. The patient's identity was confirmed using their DOB and current address. The patient has consented to being evaluated through a telehealth video encounter and understands the associated risks (an examination cannot be done and the patient may need to come in for an appointment) / benefits (allows the patient to remain at home, decreasing exposure to coronavirus). I personally spent 16 minutes on medical discussion.

## 2020-09-20 ENCOUNTER — Telehealth (INDEPENDENT_AMBULATORY_CARE_PROVIDER_SITE_OTHER): Payer: Medicare HMO | Admitting: Student

## 2020-09-20 ENCOUNTER — Encounter: Payer: Self-pay | Admitting: Student

## 2020-09-20 ENCOUNTER — Other Ambulatory Visit: Payer: Self-pay

## 2020-09-20 DIAGNOSIS — C61 Malignant neoplasm of prostate: Secondary | ICD-10-CM

## 2020-09-20 DIAGNOSIS — L89610 Pressure ulcer of right heel, unstageable: Secondary | ICD-10-CM | POA: Diagnosis not present

## 2020-09-20 DIAGNOSIS — R29898 Other symptoms and signs involving the musculoskeletal system: Secondary | ICD-10-CM

## 2020-09-20 DIAGNOSIS — L89890 Pressure ulcer of other site, unstageable: Secondary | ICD-10-CM

## 2020-09-20 DIAGNOSIS — C7951 Secondary malignant neoplasm of bone: Secondary | ICD-10-CM

## 2020-09-20 DIAGNOSIS — L89620 Pressure ulcer of left heel, unstageable: Secondary | ICD-10-CM

## 2020-09-20 DIAGNOSIS — R5381 Other malaise: Secondary | ICD-10-CM | POA: Diagnosis not present

## 2020-09-20 NOTE — Assessment & Plan Note (Signed)
Since being diagnosed and starting chemotherapy for prostate cancer with bone metastases, patient has been spending most of his time in bed due to pain with ambulating. He has since developed pressure injuries on bilateral feet. History of diabetes with last A1c of 5.0 treated with lifestyle modifications only. These were assessed through tele visit. He has a roughly 4cm unstageable pressure injury on the R without skin breakdown, and a smaller 2cm unstageable pressure injury on the L without skin breakdown. Denies any sacral or other pressure injuries at this time. No fever, chills, or other signs of systemic illness.  -discussed with daughter intervention to offload pressure such as pillows or other cushioning, frequent turning, and examining his skin -discussed getting him out of bed when possible. Ordering wheelchair and [place] OT to help with this. -ordered [facility] RN to monitor for wound healing -palliative care is involved and will be visiting in person on 2/28, they can assist with further equipment needs

## 2020-09-20 NOTE — Assessment & Plan Note (Signed)
Since being diagnosed and starting chemotherapy for prostate cancer with bone metastases, patient has been bedbound most of the day. He is weak, and it is also very pain to walk due to metastases. Discussed with daughter to frequently shift him to present further pressures ulcers, and to get out of bed to chair as much as possible.   His mobility issues as noted above limit his ability to participate in toileting, dressing, grooming, and bathing. A cane or walker would not adequately resolve this. He is able to safely use a manual wheelchair. His functional mobility deficit would be resolved by the use of a manual wheelchair, since he would be able to get around the house without pain. There is a caregiver present 24/7 to assist with maneuvering. The caregiver can propel the chair.   -lightweight wheelchair -[place] OT

## 2020-09-23 ENCOUNTER — Encounter: Payer: Self-pay | Admitting: Student in an Organized Health Care Education/Training Program

## 2020-09-23 NOTE — Addendum Note (Signed)
Addended by: Andrew Au on: 09/23/2020 12:38 PM   Modules accepted: Level of Service

## 2020-09-24 NOTE — Telephone Encounter (Signed)
That sounds fine. I will be happy to complete the paperwork.

## 2020-09-25 NOTE — Addendum Note (Signed)
Encounter addended by: Tyler Pita, MD on: 09/25/2020 4:07 PM  Actions taken: Clinical Note Signed

## 2020-09-26 NOTE — Addendum Note (Signed)
Addended by: Andrew Au on: 09/26/2020 03:31 PM   Modules accepted: Orders

## 2020-09-26 NOTE — Progress Notes (Signed)
Internal Medicine Clinic Attending  Case discussed with Dr. Chen  At the time of the visit.  We reviewed the resident's history and exam and pertinent patient test results.  I agree with the assessment, diagnosis, and plan of care documented in the resident's note. 

## 2020-09-27 ENCOUNTER — Telehealth: Payer: Self-pay | Admitting: *Deleted

## 2020-09-27 ENCOUNTER — Telehealth: Payer: Self-pay | Admitting: Radiation Oncology

## 2020-09-27 NOTE — Telephone Encounter (Signed)
Following CM sent to Lurlean Nanny at [facility]: This patient has [employer]. F2F was 09/20/2020. He is also receiving Palliative care services. Will you be able to accept him for [facility] RN and OT?

## 2020-09-27 NOTE — Telephone Encounter (Signed)
Theophilus Bones, RN; Sandi Raveling, [place]; [place], Rise Paganini; [place], [place]; 1 other   received

## 2020-09-27 NOTE — Telephone Encounter (Signed)
Per Romie Jumper this patient's wife, Otila Kluver, called. Enid Derry explains that Otila Kluver said she can no longer care for her husband at home. My understanding is that the patient has the support of his daughter, Yvetta Coder, and home health. This patient is a referral from Dr. Alen Blew for [place]. Reached out to Evette Georges, nurse covering Dr. Hazeline Junker patient load today, via private message. Questioned if Dr. Alen Blew feels it appropriate for a palliative care or hospice referral.

## 2020-09-27 NOTE — Telephone Encounter (Signed)
CM sent to Skeet Latch at [facility] for lightweight manual wheelchair and Prevlon boots. F2F was 09/20/2020.  Hubbard Hartshorn, BSN, RN-BC

## 2020-09-27 NOTE — Telephone Encounter (Signed)
RETURNED PATIENT'S WIFE PHONE CALL, SPOKE WITH PATIENTS' [place]

## 2020-09-30 ENCOUNTER — Other Ambulatory Visit: Payer: Self-pay

## 2020-09-30 DIAGNOSIS — C61 Malignant neoplasm of prostate: Secondary | ICD-10-CM

## 2020-09-30 NOTE — Telephone Encounter (Signed)
I have no objections to hospice care

## 2020-09-30 NOTE — Progress Notes (Signed)
Called and spoke to [facility] referral center as patient is currently being seen for Palliative services through them.  Referral submitted for Hospice care consult with Dr. Alen Blew agreeing to be the attending physician.

## 2020-10-01 NOTE — Telephone Encounter (Signed)
[place], Britney  [place], Orvis Brill, RN; [place], [place], [place]  Good morning,   I apologize for the delay. Unfortunately at this time I cannot accept an [place].

## 2020-10-07 ENCOUNTER — Encounter: Payer: Self-pay | Admitting: *Deleted

## 2020-10-07 ENCOUNTER — Other Ambulatory Visit: Payer: Self-pay | Admitting: Student

## 2020-10-07 ENCOUNTER — Telehealth: Payer: Self-pay | Admitting: *Deleted

## 2020-10-07 NOTE — Telephone Encounter (Signed)
CM sent to Patsy Baltimore at [facility] to see if she can accept patient for [facility] RN/OT.

## 2020-10-07 NOTE — Telephone Encounter (Signed)
Mosie Lukes; [place], Orvis Brill, RN; [place], Levada Dy; [place], [place]; [place], Mamie C, NT  Good afternoon Ege Muckey   I am sorry but we just heard back and we do not have staffing/capacity to accept this pt. We will have to decline.   Angie

## 2020-10-07 NOTE — Telephone Encounter (Signed)
CALLED PATIENT TO REMIND OF LAB AND WEIGHT FOR 10-08-20 @ 12 PM @ [place], LVM FOR A RETURN CALL

## 2020-10-07 NOTE — Telephone Encounter (Signed)
Abelina Bachelor  [place], Orvis Brill, RN; [place], [place]; [place], [place]; [place], [place]; [place], [place], [place] Deno Lunger,   I have sent this referral to sales for acceptance/review, I will let you know what they say as soon as I hear back from them.    Thank you!

## 2020-10-07 NOTE — Progress Notes (Unsigned)

## 2020-10-07 NOTE — Telephone Encounter (Signed)
Spoke with [place] Pack, LPN with [facility], to see if she can accept patient for [facility] RN/OT. She does not send CMs but will call back tomorrow with an answer.

## 2020-10-08 ENCOUNTER — Ambulatory Visit: Payer: Medicare HMO

## 2020-10-08 ENCOUNTER — Telehealth: Payer: Self-pay | Admitting: *Deleted

## 2020-10-08 NOTE — Telephone Encounter (Signed)
Returned patient's daughter's phone call, spoke with patient's daughter ?

## 2020-10-08 NOTE — Telephone Encounter (Signed)
[facility] ([facility]viously [facility]) is currently unable to accept the patient due to staffing at this time. We are however, one of the few providers available for [employer], [place], and [employer]. Please keep [place] in mind for future referrals. I hope to see you soon.   [place]tar Pack, [place]

## 2020-10-08 NOTE — Telephone Encounter (Signed)
I spoke to [facility] they are not accepting [employer] at this time [place], [place] C3/1/20222:40 PM

## 2020-10-09 NOTE — Telephone Encounter (Signed)
I spoke to [facility] there nursing is so far behind and they do not have OT at all.I will try another agency [place], [place] C3/2/20229:59 AM

## 2020-10-10 NOTE — Telephone Encounter (Signed)
I have sent a community message to [place]. I LVM for Levada Dy for [facility] waiting for response back [place], [place] C3/3/20224:10 PM

## 2020-10-11 ENCOUNTER — Other Ambulatory Visit: Payer: Self-pay | Admitting: Student in an Organized Health Care Education/Training Program

## 2020-10-11 DIAGNOSIS — I10 Essential (primary) hypertension: Secondary | ICD-10-CM

## 2020-10-11 NOTE — Telephone Encounter (Signed)
Received message from [facility] they do not accept patients [place], [place] C3/4/20229:10 AM

## 2020-10-11 NOTE — Telephone Encounter (Signed)
Please see progress note of 09-30-20 Patient is being seen by [facility] for palliative services, a referreal has been submitted for Hospice Care with [facility]. [facility] as attending Physician [place], Falyn Rubel C3/4/202212:13 PM

## 2020-10-14 NOTE — Progress Notes (Signed)
Struggling through active cancer treatments, would hold off on AWV for now.

## 2020-10-15 ENCOUNTER — Inpatient Hospital Stay (HOSPITAL_COMMUNITY): Admission: RE | Admit: 2020-10-15 | Payer: Medicare HMO | Source: Ambulatory Visit

## 2020-10-16 ENCOUNTER — Other Ambulatory Visit: Payer: Medicare HMO

## 2020-10-16 ENCOUNTER — Other Ambulatory Visit: Payer: Self-pay

## 2020-10-16 ENCOUNTER — Inpatient Hospital Stay: Payer: Medicare HMO | Attending: Oncology | Admitting: Oncology

## 2020-12-08 DEATH — deceased

## 2020-12-19 ENCOUNTER — Telehealth: Payer: Self-pay | Admitting: *Deleted

## 2020-12-19 NOTE — Telephone Encounter (Signed)
Faxed signed orders signed by Dr. Alen Blew to [facility] [phone]. Fax confirmation received. Original sent HIM to be scanned.

## 2023-07-01 NOTE — Telephone Encounter (Signed)
Telephone call
